# Patient Record
Sex: Male | Born: 1950
Health system: Southern US, Community
[De-identification: ages and names within clinical notes are randomized; demographics above are authoritative.]

## PROBLEM LIST (undated history)

## (undated) DIAGNOSIS — N189 Chronic kidney disease, unspecified: Secondary | ICD-10-CM

## (undated) DIAGNOSIS — Z87442 Personal history of urinary calculi: Secondary | ICD-10-CM

## (undated) DIAGNOSIS — I10 Essential (primary) hypertension: Secondary | ICD-10-CM

## (undated) DIAGNOSIS — C61 Malignant neoplasm of prostate: Secondary | ICD-10-CM

## (undated) DIAGNOSIS — I4891 Unspecified atrial fibrillation: Secondary | ICD-10-CM

## (undated) DIAGNOSIS — Z978 Presence of other specified devices: Secondary | ICD-10-CM

## (undated) DIAGNOSIS — E119 Type 2 diabetes mellitus without complications: Secondary | ICD-10-CM

## (undated) DIAGNOSIS — I509 Heart failure, unspecified: Secondary | ICD-10-CM

## (undated) HISTORY — DX: Type 2 diabetes mellitus without complications: E11.9

## (undated) HISTORY — PX: TRIGGER FINGER RELEASE: SHX641

## (undated) HISTORY — DX: Malignant neoplasm of prostate: C61

## (undated) HISTORY — DX: Unspecified atrial fibrillation: I48.91

## (undated) HISTORY — PX: OTHER SURGICAL HISTORY: SHX169

## (undated) HISTORY — PX: LITHOTRIPSY: SUR834

## (undated) HISTORY — DX: Essential (primary) hypertension: I10

## (undated) HISTORY — PX: PROSTATE BIOPSY: SHX241

## (undated) HISTORY — PX: URETEROSCOPY WITH HOLMIUM LASER LITHOTRIPSY: SHX6645

---

## 2002-12-20 ENCOUNTER — Ambulatory Visit: Admission: RE | Admit: 2002-12-20 | Discharge: 2003-03-20 | Payer: Self-pay | Admitting: Radiation Oncology

## 2003-01-07 ENCOUNTER — Encounter: Admission: RE | Admit: 2003-01-07 | Discharge: 2003-01-07 | Payer: Self-pay | Admitting: Urology

## 2003-01-07 ENCOUNTER — Encounter: Payer: Self-pay | Admitting: Urology

## 2003-03-12 ENCOUNTER — Ambulatory Visit (HOSPITAL_BASED_OUTPATIENT_CLINIC_OR_DEPARTMENT_OTHER): Admission: RE | Admit: 2003-03-12 | Discharge: 2003-03-12 | Payer: Self-pay | Admitting: Urology

## 2003-05-28 ENCOUNTER — Ambulatory Visit (HOSPITAL_BASED_OUTPATIENT_CLINIC_OR_DEPARTMENT_OTHER): Admission: RE | Admit: 2003-05-28 | Discharge: 2003-05-28 | Payer: Self-pay | Admitting: Urology

## 2003-05-28 ENCOUNTER — Ambulatory Visit (HOSPITAL_COMMUNITY): Admission: RE | Admit: 2003-05-28 | Discharge: 2003-05-28 | Payer: Self-pay | Admitting: Urology

## 2003-05-28 ENCOUNTER — Encounter: Payer: Self-pay | Admitting: Urology

## 2003-06-17 ENCOUNTER — Ambulatory Visit: Admission: RE | Admit: 2003-06-17 | Discharge: 2003-06-26 | Payer: Self-pay | Admitting: Radiation Oncology

## 2004-03-10 ENCOUNTER — Ambulatory Visit (HOSPITAL_COMMUNITY): Admission: RE | Admit: 2004-03-10 | Discharge: 2004-03-10 | Payer: Self-pay | Admitting: Family Medicine

## 2011-06-08 ENCOUNTER — Other Ambulatory Visit: Payer: Self-pay | Admitting: Medical

## 2013-03-08 ENCOUNTER — Encounter: Payer: Self-pay | Admitting: Cardiology

## 2013-03-23 ENCOUNTER — Encounter: Payer: Self-pay | Admitting: Cardiology

## 2013-03-23 ENCOUNTER — Ambulatory Visit (INDEPENDENT_AMBULATORY_CARE_PROVIDER_SITE_OTHER): Payer: BC Managed Care – PPO | Admitting: Cardiology

## 2013-03-23 VITALS — BP 153/92 | HR 88 | Ht 70.5 in | Wt 228.8 lb

## 2013-03-23 DIAGNOSIS — I4891 Unspecified atrial fibrillation: Secondary | ICD-10-CM

## 2013-03-23 DIAGNOSIS — I10 Essential (primary) hypertension: Secondary | ICD-10-CM | POA: Insufficient documentation

## 2013-03-23 DIAGNOSIS — E119 Type 2 diabetes mellitus without complications: Secondary | ICD-10-CM | POA: Insufficient documentation

## 2013-03-23 NOTE — Assessment & Plan Note (Signed)
Diagnosed in 2011 by report, persistent, documented by recent ECG. He does not seem to be overly symptomatic other than a sense of palpitations. Heart rate seems adequately controlled today. After above discussion, plan will be to initiate Eliquis after he has completed dental work off aspirin. Will review labs done recently in terms of renal function and CBC. He will also need an echocardiogram to assess cardiac structure and function. Will bring him back to the office in the next few months and then decide if we need to consider cardioversion attempt or not.

## 2013-03-23 NOTE — Assessment & Plan Note (Signed)
Blood pressure elevated today. Patient reports compliance with his medications. Keep follow up with Dr. Margo Common.

## 2013-03-23 NOTE — Assessment & Plan Note (Signed)
On oral agents for control, followed by Dr. Margo Common.

## 2013-03-23 NOTE — Progress Notes (Signed)
Clinical Summary Daniel Schaefer is a 62 y.o.male referred for cardiology consultation by Dr. Margo Common. Available records reviewed. Patient was diagnosed with atrial fibrillation back in 2011. He recalls that he was taking some classes at that time, very stressful, staying up late hours, and using energy supplements. He reports feeling a vague sense of palpitations, but was told that he had an irregular heart rate on a routine check leading to the diagnosis. It is not entirely clear whether he has had paroxysmal atrial fibrillation, or whether this has persisted.  ECG reviewed from May confirms atrial fibrillation with PVC. He has no history of ischemic heart disease or cardiomyopathy. CHADS2 score is 2 based on diabetes and hypertension.  Today we discussed treatment options for atrial fibrillation in general. This includes options for heart rate control, rhythm control including cardioversion, and anticoagulation. Discussed risks and benefits of these as well.  He needs to have dental work done soon off aspirin. After our discussion, he has agreed to initiating an anticoagulant after he had the dental work completed, and this will also give [place] the opportunity to ultimately consider a cardioversion. He would need to come off aspirin with initiation of anticoagulant therapy.  He had recent lab work in May, results to be requested. He reports no unusual bleeding problems.  No Known Allergies  Current Outpatient Prescriptions  Medication Sig Dispense Refill  . aspirin 325 MG tablet Take 325 mg by mouth daily.      . metFORMIN (GLUCOPHAGE) 1000 MG tablet Take 1,000 mg by mouth 2 (two) times daily with a meal.      . metoprolol tartrate (LOPRESSOR) 25 MG tablet Take 25 mg by mouth 2 (two) times daily.      Marland Kitchen NIFEdipine (PROCARDIA-XL/ADALAT CC) 30 MG 24 hr tablet Take 30 mg by mouth daily.      . tadalafil (CIALIS) 20 MG tablet Take 20 mg by mouth daily as needed for erectile dysfunction.       No  current facility-administered medications for this visit.    Past Medical History  Diagnosis Date  . Atrial fibrillation     Reportedly diagnosed November 2011  . Type 2 diabetes mellitus   . Essential hypertension, benign   . Prostate cancer     Radiation implants    History reviewed. No pertinent past surgical history.  Family History  Problem Relation Age of Onset  . Cancer Father 43    Laryngeal  . Lymphoma Mother 30    Social History Daniel Schaefer reports that he has never smoked. He does not have any smokeless tobacco history on file. Daniel Schaefer reports that  drinks alcohol.  Review of Systems No orthopnea or PND. Pitting edema. No claudication. No regular exertional chest pain symptoms. No bleeding problems.  Physical Examination Filed Vitals:   03/23/13 1349  BP: 153/92  Pulse: 88   Filed Weights   03/23/13 1349  Weight: 228 lb 12.8 oz (103.783 kg)   Overweight male, comfortable at rest. HEENT: Conjunctiva and lids normal, oropharynx clear. Neck: Supple, no elevated JVP or carotid bruits, no thyromegaly. Lungs: Clear to auscultation, nonlabored breathing at rest. Cardiac: Irregularly irregular, no S3 or significant systolic murmur, no pericardial rub. Abdomen: Soft, nontender, bowel sounds present, no guarding or rebound. Extremities: No pitting edema, distal pulses 2+. Skin: Warm and dry. Musculoskeletal: No kyphosis. Neuropsychiatric: Alert and oriented x3, affect grossly appropriate.   Problem List and Plan   Atrial fibrillation Diagnosed in 2011 by report, persistent,  documented by recent ECG. He does not seem to be overly symptomatic other than a sense of palpitations. Heart rate seems adequately controlled today. After above discussion, plan will be to initiate Eliquis after he has completed dental work off aspirin. Will review labs done recently in terms of renal function and CBC. He will also need an echocardiogram to assess cardiac structure  and function. Will bring him back to the office in the next few months and then decide if we need to consider cardioversion attempt or not.  Essential hypertension, benign Blood pressure elevated today. Patient reports compliance with his medications. Keep follow up with Dr. Margo Common.  Type 2 diabetes mellitus On oral agents for control, followed by Dr. Margo Common.    Jonelle Sidle, M.D., F.A.C.C.

## 2013-03-23 NOTE — Patient Instructions (Addendum)
Your physician recommends that you schedule a follow-up appointment in: 2-3 months. Your physician recommends that you continue on your current medications as directed. Please refer to the Current Medication list given to you today. Your physician has requested that you have an echocardiogram. Echocardiography is a painless test that uses sound waves to create images of your heart. It provides your doctor with information about the size and shape of your heart and how well your heart's chambers and valves are working. This procedure takes approximately one hour. There are no restrictions for this procedure. Please call our office after your dental work is completed so that we can discuss your new medication.

## 2013-04-04 ENCOUNTER — Other Ambulatory Visit (INDEPENDENT_AMBULATORY_CARE_PROVIDER_SITE_OTHER): Payer: BC Managed Care – PPO

## 2013-04-04 ENCOUNTER — Other Ambulatory Visit: Payer: Self-pay

## 2013-04-04 DIAGNOSIS — I4891 Unspecified atrial fibrillation: Secondary | ICD-10-CM

## 2013-04-06 ENCOUNTER — Telehealth: Payer: Self-pay | Admitting: *Deleted

## 2013-04-06 NOTE — Telephone Encounter (Signed)
Message copied by Eustace Moore on Fri Apr 06, 2013 10:47 AM ------      Message from: MCDOWELL, Illene Bolus      Created: Thu Apr 05, 2013  8:32 AM       Reviewed report. LVEF is low normal at 50-55%, no regional wall motion abnormalities. Mild left atrial enlargement. No major valvular abnormalities. Continue current plan for management of atrial fibrillation. ------

## 2013-04-06 NOTE — Telephone Encounter (Signed)
Patient informed. 

## 2013-05-23 ENCOUNTER — Encounter: Payer: Self-pay | Admitting: Cardiology

## 2013-05-23 ENCOUNTER — Ambulatory Visit (INDEPENDENT_AMBULATORY_CARE_PROVIDER_SITE_OTHER): Payer: BC Managed Care – PPO | Admitting: Cardiology

## 2013-05-23 VITALS — BP 133/86 | HR 85 | Ht 70.0 in | Wt 226.0 lb

## 2013-05-23 DIAGNOSIS — I10 Essential (primary) hypertension: Secondary | ICD-10-CM

## 2013-05-23 DIAGNOSIS — I4891 Unspecified atrial fibrillation: Secondary | ICD-10-CM

## 2013-05-23 NOTE — Patient Instructions (Addendum)
Your physician recommends that you schedule a follow-up appointment in: 3 months. You will receive a reminder letter in the mail in about 1-2 months reminding you to call and schedule your appointment. If you don't receive this letter, please contact our office. Your physician recommends that you continue on your current medications as directed. Please refer to the Current Medication list given to you today. Your physician recommends that you have lab work today for BMET and CBC. Please have this done at [facility]. We will contact you with the lab results let you know about starting eliquis 5 mg twice daily and stopping aspirin at that time.

## 2013-05-23 NOTE — Assessment & Plan Note (Signed)
After further discussion today, plan will be to continue with a strategy of heart rate control, initiate Eliquis 5 mg twice daily in lieu of aspirin, presuming CBC and BMET are reasonable at baseline. Will obtain lab work first prior to starting the medication. Followup anticipated 3 months.

## 2013-05-23 NOTE — Progress Notes (Signed)
   Clinical Summary Mr. Daniel Schaefer is a 62 y.o.male seen in July of this year. Detailed discussion regarding long-term management of atrial fibrillation was had at that time. In the interim he has had dental work done, now prepared to start an anticoagulant. We have discussed Eliquis and he is in agreement to proceed. Would like a baseline CBC and BMET first. Creatinine was normal last year.  An echocardiogram from July showed mild LVH with LVEF 50-55%, indeterminate diastolic function, mild left atrial enlargement, mild pulmonic regurgitation.We discussed this today.  He reports no palpitations or unusual breathlessness. CHADS2 score is 2 as outlined previously. He reports no major bleeding problems.  No Known Allergies  Current Outpatient Prescriptions  Medication Sig Dispense Refill  . aspirin 325 MG tablet Take 325 mg by mouth daily.      . metFORMIN (GLUCOPHAGE) 1000 MG tablet Take 1,000 mg by mouth 2 (two) times daily with a meal.      . metoprolol tartrate (LOPRESSOR) 25 MG tablet Take 25 mg by mouth 2 (two) times daily.      Marland Kitchen NIFEdipine (PROCARDIA-XL/ADALAT CC) 30 MG 24 hr tablet Take 30 mg by mouth daily.      . tadalafil (CIALIS) 20 MG tablet Take 20 mg by mouth daily as needed for erectile dysfunction.       No current facility-administered medications for this visit.    Past Medical History  Diagnosis Date  . Atrial fibrillation     Reportedly diagnosed November 2011  . Type 2 diabetes mellitus   . Essential hypertension, benign   . Prostate cancer     Radiation implants    Social History Mr. Feil reports that he has never smoked. He does not have any smokeless tobacco history on file. Mr. Kortz reports that  drinks alcohol.  Review of Systems As outlined above.  Physical Examination Filed Vitals:   05/23/13 1523  BP: 133/86  Pulse: 85   Filed Weights   05/23/13 1519  Weight: 226 lb (102.513 kg)    Overweight male, comfortable at rest.  HEENT:  Conjunctiva and lids normal, oropharynx clear.  Neck: Supple, no elevated JVP or carotid bruits, no thyromegaly.  Lungs: Clear to auscultation, nonlabored breathing at rest.  Cardiac: Irregularly irregular, no S3 or significant systolic murmur, no pericardial rub.  Abdomen: Soft, nontender, bowel sounds present, no guarding or rebound.  Extremities: No pitting edema, distal pulses 2+.    Problem List and Plan   Atrial fibrillation After further discussion today, plan will be to continue with a strategy of heart rate control, initiate Eliquis 5 mg twice daily in lieu of aspirin, presuming CBC and BMET are reasonable at baseline. Will obtain lab work first prior to starting the medication. Followup anticipated 3 months.  Essential hypertension, benign Keep followup with Dr. Margo Common.    Jonelle Sidle, M.D., F.A.C.C.

## 2013-05-23 NOTE — Assessment & Plan Note (Signed)
Keep followup with Dr. Tapper. 

## 2013-05-30 ENCOUNTER — Telehealth: Payer: Self-pay | Admitting: Cardiology

## 2013-05-30 MED ORDER — APIXABAN 5 MG PO TABS: 5.0000 mg | ORAL_TABLET | Freq: Two times a day (BID) | ORAL | Status: DC

## 2013-05-30 NOTE — Telephone Encounter (Signed)
Pt informed. Pt also stated that if medication was expensive he would need another medication because he would not be able to afford medication.

## 2013-05-30 NOTE — Telephone Encounter (Signed)
Message copied by Burnice Logan on Wed May 30, 2013  4:13 PM ------      Message from: MCDOWELL, Illene Bolus      Created: Mon May 28, 2013  9:03 AM       Laboratory reviewed. Creatinine 1.2, hemoglobin 14.1. Should be able to start Eliquis as previously discussed my most recent office visit note. ------

## 2013-07-16 ENCOUNTER — Ambulatory Visit (INDEPENDENT_AMBULATORY_CARE_PROVIDER_SITE_OTHER): Payer: BC Managed Care – PPO | Admitting: Cardiology

## 2013-07-16 ENCOUNTER — Encounter: Payer: Self-pay | Admitting: Cardiology

## 2013-07-16 VITALS — BP 146/96 | HR 91 | Ht 71.0 in | Wt 217.4 lb

## 2013-07-16 DIAGNOSIS — I4891 Unspecified atrial fibrillation: Secondary | ICD-10-CM

## 2013-07-16 DIAGNOSIS — I10 Essential (primary) hypertension: Secondary | ICD-10-CM

## 2013-07-16 NOTE — Progress Notes (Signed)
    Clinical Summary Mr. Bacigalupi is a 62 y.o.male last seen in September. At that time he was started on Eliquis 5 mg twice daily, aspirin was stopped. He states that he has tolerated things so far. No significant bleeding issues. Interestingly, he tells me that once he started Eliquis, he seemed to have improved sensation, sense of touch.  Recent lab work showed BUN 15, creatinine 1.2, potassium 4.1, hemoglobin 14.1, platelets 239.  An echocardiogram from July showed mild LVH with LVEF 50-55%, indeterminate diastolic function, mild left atrial enlargement, mild pulmonic regurgitation.  He will be seeing Dr. Margo Common soon for a followup visit. He tells me that he has been is limiting carbohydrates in his diet and has lost 9 pounds.   No Known Allergies  Current Outpatient Prescriptions  Medication Sig Dispense Refill  . apixaban (ELIQUIS) 5 MG TABS tablet Take 1 tablet (5 mg total) by mouth 2 (two) times daily.  60 tablet  6  . metFORMIN (GLUCOPHAGE) 1000 MG tablet Take 1,000 mg by mouth 2 (two) times daily with a meal.      . metoprolol tartrate (LOPRESSOR) 25 MG tablet Take 25 mg by mouth 2 (two) times daily.      Marland Kitchen NIFEdipine (PROCARDIA-XL/ADALAT CC) 30 MG 24 hr tablet Take 30 mg by mouth daily.      . tadalafil (CIALIS) 20 MG tablet Take 20 mg by mouth daily as needed for erectile dysfunction.       No current facility-administered medications for this visit.    Past Medical History  Diagnosis Date  . Atrial fibrillation     Reportedly diagnosed November 2011  . Type 2 diabetes mellitus   . Essential hypertension, benign   . Prostate cancer     Radiation implants    Social History Mr. Matton reports that he has never smoked. He does not have any smokeless tobacco history on file. Mr. Oyster reports that he drinks alcohol.  Review of Systems No significant palpitations, no chest pain. Otherwise as outlined above.  Physical Examination Filed Vitals:   07/16/13 1144   BP: 146/96  Pulse: 91   Filed Weights   07/16/13 1144  Weight: 217 lb 6.4 oz (98.612 kg)    Overweight male, comfortable at rest.  HEENT: Conjunctiva and lids normal, oropharynx clear.  Neck: Supple, no elevated JVP or carotid bruits, no thyromegaly.  Lungs: Clear to auscultation, nonlabored breathing at rest.  Cardiac: Irregularly irregular, no S3 or significant systolic murmur, no pericardial rub.  Abdomen: Soft, nontender, bowel sounds present, no guarding or rebound.  Extremities: No pitting edema, distal pulses 2+.    Problem List and Plan   Atrial fibrillation Diagnosed in 2011 by report, persistent. He is tolerating Eliquis which will be continued at this point. Rate control via Lopressor. Check BMET and CBC in late December.  Essential hypertension, benign Blood pressure elevated today. We did discuss this. In addition to diet and weight loss, watch sodium intake more carefully. No other change to current regimen.    Jonelle Sidle, M.D., F.A.C.C.

## 2013-07-16 NOTE — Assessment & Plan Note (Signed)
Blood pressure elevated today. We did discuss this. In addition to diet and weight loss, watch sodium intake more carefully. No other change to current regimen.

## 2013-07-16 NOTE — Patient Instructions (Signed)
Your physician recommends that you schedule a follow-up appointment in: 3 months. Your physician recommends that you continue on your current medications as directed. Please refer to the Current Medication list given to you today. Your physician recommends that you return have non-fasting lab work done in late December 2014 to check a BMET and CBC levels. Please call our office before you have this done for instructions about where the lab work should be done at.

## 2013-07-16 NOTE — Assessment & Plan Note (Signed)
Diagnosed in 2011 by report, persistent. He is tolerating Eliquis which will be continued at this point. Rate control via Lopressor. Check BMET and CBC in late December.

## 2013-07-19 ENCOUNTER — Other Ambulatory Visit: Payer: Self-pay

## 2013-08-29 ENCOUNTER — Telehealth: Payer: Self-pay | Admitting: *Deleted

## 2013-08-29 NOTE — Telephone Encounter (Signed)
Left message to remind patient to have requested lab work done.

## 2013-08-30 ENCOUNTER — Ambulatory Visit: Payer: BC Managed Care – PPO | Admitting: Cardiology

## 2013-08-31 ENCOUNTER — Other Ambulatory Visit: Payer: Self-pay | Admitting: Cardiology

## 2013-08-31 LAB — CBC
HCT: 40.1 % (ref 39.0–52.0)
MCHC: 34.9 g/dL (ref 30.0–36.0)
MCV: 75.9 fL — ABNORMAL LOW (ref 78.0–100.0)
Platelets: 235 10*3/uL (ref 150–400)
RDW: 14.4 % (ref 11.5–15.5)

## 2013-08-31 LAB — BASIC METABOLIC PANEL
BUN: 15 mg/dL (ref 6–23)
Creat: 1.06 mg/dL (ref 0.50–1.35)

## 2013-09-04 ENCOUNTER — Telehealth: Payer: Self-pay | Admitting: *Deleted

## 2013-09-04 NOTE — Telephone Encounter (Signed)
Message copied by Eustace Moore on Tue Sep 04, 2013  1:47 PM ------      Message from: MCDOWELL, Illene Bolus      Created: Mon Sep 03, 2013  5:13 PM       Reviewed.  Hemoglobin and creatinine are stable.  No change to current regimen. ------

## 2013-09-05 NOTE — Telephone Encounter (Signed)
Patient informed. 

## 2013-10-30 ENCOUNTER — Ambulatory Visit: Payer: BC Managed Care – PPO | Admitting: Cardiology

## 2013-11-20 ENCOUNTER — Ambulatory Visit (INDEPENDENT_AMBULATORY_CARE_PROVIDER_SITE_OTHER): Payer: Medicare Other | Admitting: Cardiology

## 2013-11-20 ENCOUNTER — Encounter: Payer: Self-pay | Admitting: Cardiology

## 2013-11-20 VITALS — BP 123/87 | HR 98 | Ht 70.0 in | Wt 208.8 lb

## 2013-11-20 DIAGNOSIS — I4891 Unspecified atrial fibrillation: Secondary | ICD-10-CM

## 2013-11-20 DIAGNOSIS — I10 Essential (primary) hypertension: Secondary | ICD-10-CM

## 2013-11-20 NOTE — Assessment & Plan Note (Signed)
Blood pressure is normal today. 

## 2013-11-20 NOTE — Progress Notes (Signed)
    Clinical Summary Mr. Uselman is a 63 y.o.male last seen in November 2014. He has been doing well without any progressive palpitations, no bleeding problems on Eliquis. This was approved by his insurance company, but he is still concerned about the "donut hole" later in the year, worried that he will not be able to afford his medications. He asked that we switch to something different. We talked about his options including Coumadin, and the NOACs including Savaysa, the newest agent. This one has a very attractive pricing profile in comparison to the other agents.  Followup lab work in December 2014 showed hemoglobin 14.0, platelets 235, potassium 4.9, BUN 15, creatinine 1.0.  Echocardiogram from July 2014 showed mild LVH with LVEF 50-55%, indeterminate diastolic function, mild left atrial enlargement, mild pulmonic regurgitation.   No Known Allergies  Current Outpatient Prescriptions  Medication Sig Dispense Refill  . apixaban (ELIQUIS) 5 MG TABS tablet Take 1 tablet (5 mg total) by mouth 2 (two) times daily.  60 tablet  6  . metFORMIN (GLUCOPHAGE) 1000 MG tablet Take 1,000 mg by mouth 2 (two) times daily with a meal.      . metoprolol tartrate (LOPRESSOR) 25 MG tablet Take 25 mg by mouth 2 (two) times daily.      Marland Kitchen NIFEdipine (PROCARDIA-XL/ADALAT CC) 30 MG 24 hr tablet Take 30 mg by mouth daily.      . tadalafil (CIALIS) 20 MG tablet Take 20 mg by mouth daily as needed for erectile dysfunction.       No current facility-administered medications for this visit.    Past Medical History  Diagnosis Date  . Atrial fibrillation     Reportedly diagnosed November 2011  . Type 2 diabetes mellitus   . Essential hypertension, benign   . Prostate cancer     Radiation implants    Social History Mr. Zoll reports that he has never smoked. He does not have any smokeless tobacco history on file. Mr. Patchell reports that he drinks alcohol.  Review of Systems Negative except as  outlined.  Physical Examination Filed Vitals:   11/20/13 1448  BP: 123/87  Pulse: 98   Filed Weights   11/20/13 1448  Weight: 208 lb 12.8 oz (94.711 kg)    Overweight male, comfortable at rest.  HEENT: Conjunctiva and lids normal, oropharynx clear.  Neck: Supple, no elevated JVP or carotid bruits, no thyromegaly.  Lungs: Clear to auscultation, nonlabored breathing at rest.  Cardiac: Irregularly irregular, no S3 or significant systolic murmur, no pericardial rub.  Abdomen: Soft, nontender, bowel sounds present, no guarding or rebound.  Extremities: No pitting edema, distal pulses 2+.    Problem List and Plan   Atrial fibrillation Diagnosed in 2011 by report, persistent. He is tolerating Eliquis and had lab work reviewed with stable findings. He is concerned about the cost of this medication, particularly later in the year as coverages change. He would like to make a switch to a different anticoagulant. As noted above, we discussed his options and will try Savaysa which it sounds like he will be able to get for $4 a month. This is the newest agent, I would like to have this coordinated through our anticoagulation clinic. Data look favorable in terms of stroke risk reduction and bleeding risk.   Essential hypertension, benign Blood pressure is normal today.    Satira Sark, M.D., F.A.C.C.

## 2013-11-20 NOTE — Assessment & Plan Note (Signed)
Diagnosed in 2011 by report, persistent. He is tolerating Eliquis and had lab work reviewed with stable findings. He is concerned about the cost of this medication, particularly later in the year as coverages change. He would like to make a switch to a different anticoagulant. As noted above, we discussed his options and will try Savaysa which it sounds like he will be able to get for $4 a month. This is the newest agent, I would like to have this coordinated through our anticoagulation clinic. Data look favorable in terms of stroke risk reduction and bleeding risk.

## 2013-11-20 NOTE — Patient Instructions (Signed)
Your physician recommends that you schedule a follow-up appointment in: 3 months. Your physician recommends that you continue on your current medications as directed. Please refer to the Current Medication list given to you today. You will see Daniel Schaefer this Friday, March 13,2015 @2 :00 pm to start savaysa.

## 2013-11-23 ENCOUNTER — Other Ambulatory Visit: Payer: Self-pay | Admitting: *Deleted

## 2013-11-23 ENCOUNTER — Ambulatory Visit (INDEPENDENT_AMBULATORY_CARE_PROVIDER_SITE_OTHER): Payer: Medicare Other | Admitting: *Deleted

## 2013-11-23 DIAGNOSIS — Z5181 Encounter for therapeutic drug level monitoring: Secondary | ICD-10-CM

## 2013-11-23 DIAGNOSIS — I4891 Unspecified atrial fibrillation: Secondary | ICD-10-CM

## 2013-11-23 MED ORDER — EDOXABAN TOSYLATE 60 MG PO TABS: 60.0000 mg | ORAL_TABLET | Freq: Every day | ORAL | Status: DC

## 2013-11-23 NOTE — Patient Instructions (Addendum)
Changed from Eliquis to [facility] due to cost of Elquis. Rx sent to [facility] 30 day trial and savings card 12/15  SrCr 1.06  CrCl 92.72

## 2013-12-19 ENCOUNTER — Other Ambulatory Visit: Payer: Self-pay | Admitting: *Deleted

## 2013-12-19 MED ORDER — EDOXABAN TOSYLATE 60 MG PO TABS: 60.0000 mg | ORAL_TABLET | Freq: Every day | ORAL | Status: DC

## 2013-12-28 ENCOUNTER — Ambulatory Visit (INDEPENDENT_AMBULATORY_CARE_PROVIDER_SITE_OTHER): Payer: Medicare Other | Admitting: *Deleted

## 2013-12-28 DIAGNOSIS — I4891 Unspecified atrial fibrillation: Secondary | ICD-10-CM

## 2013-12-28 DIAGNOSIS — Z5181 Encounter for therapeutic drug level monitoring: Secondary | ICD-10-CM

## 2013-12-28 LAB — POCT INR: INR: 0

## 2013-12-28 MED ORDER — WARFARIN SODIUM 5 MG PO TABS: 5.0000 mg | ORAL_TABLET | Freq: Every day | ORAL | Status: DC

## 2014-01-01 ENCOUNTER — Ambulatory Visit (INDEPENDENT_AMBULATORY_CARE_PROVIDER_SITE_OTHER): Payer: Medicare Other | Admitting: *Deleted

## 2014-01-01 DIAGNOSIS — I4891 Unspecified atrial fibrillation: Secondary | ICD-10-CM

## 2014-01-01 DIAGNOSIS — Z5181 Encounter for therapeutic drug level monitoring: Secondary | ICD-10-CM

## 2014-01-01 LAB — POCT INR: INR: 1.4

## 2014-01-08 ENCOUNTER — Ambulatory Visit (INDEPENDENT_AMBULATORY_CARE_PROVIDER_SITE_OTHER): Payer: Medicare Other | Admitting: *Deleted

## 2014-01-08 DIAGNOSIS — Z5181 Encounter for therapeutic drug level monitoring: Secondary | ICD-10-CM

## 2014-01-08 DIAGNOSIS — I4891 Unspecified atrial fibrillation: Secondary | ICD-10-CM

## 2014-01-08 LAB — POCT INR: INR: 2.1

## 2014-01-22 ENCOUNTER — Ambulatory Visit (INDEPENDENT_AMBULATORY_CARE_PROVIDER_SITE_OTHER): Payer: Medicare Other | Admitting: *Deleted

## 2014-01-22 DIAGNOSIS — I4891 Unspecified atrial fibrillation: Secondary | ICD-10-CM

## 2014-01-22 DIAGNOSIS — Z5181 Encounter for therapeutic drug level monitoring: Secondary | ICD-10-CM

## 2014-01-22 LAB — POCT INR: INR: 1.4

## 2014-02-01 ENCOUNTER — Ambulatory Visit (INDEPENDENT_AMBULATORY_CARE_PROVIDER_SITE_OTHER): Payer: Medicare Other | Admitting: *Deleted

## 2014-02-01 DIAGNOSIS — I4891 Unspecified atrial fibrillation: Secondary | ICD-10-CM

## 2014-02-01 DIAGNOSIS — Z5181 Encounter for therapeutic drug level monitoring: Secondary | ICD-10-CM

## 2014-02-01 LAB — POCT INR: INR: 2.3

## 2014-02-19 ENCOUNTER — Ambulatory Visit (INDEPENDENT_AMBULATORY_CARE_PROVIDER_SITE_OTHER): Payer: Medicare Other | Admitting: *Deleted

## 2014-02-19 DIAGNOSIS — I4891 Unspecified atrial fibrillation: Secondary | ICD-10-CM

## 2014-02-19 DIAGNOSIS — Z5181 Encounter for therapeutic drug level monitoring: Secondary | ICD-10-CM

## 2014-02-19 LAB — POCT INR: INR: 2.7

## 2014-02-26 ENCOUNTER — Encounter: Payer: Self-pay | Admitting: Cardiology

## 2014-02-26 ENCOUNTER — Ambulatory Visit (INDEPENDENT_AMBULATORY_CARE_PROVIDER_SITE_OTHER): Payer: Medicare Other | Admitting: Cardiology

## 2014-02-26 VITALS — BP 126/76 | HR 59 | Ht 70.0 in | Wt 211.1 lb

## 2014-02-26 DIAGNOSIS — I4891 Unspecified atrial fibrillation: Secondary | ICD-10-CM

## 2014-02-26 DIAGNOSIS — I10 Essential (primary) hypertension: Secondary | ICD-10-CM

## 2014-02-26 NOTE — Assessment & Plan Note (Signed)
Diagnosed November 2011, but presumably paroxysmal with ECG showing sinus rhythm today. Continue current regimen including Coumadin.

## 2014-02-26 NOTE — Progress Notes (Signed)
    Clinical Summary Mr. Celaya is a 63 y.o.male last seen in March of this year. He is on Coumadin for stroke prophylaxis at this point, followed in our anticoagulation clinic. He has had no bleeding problems. He states that he has felt more regularity in his heart rate in the last few months, actually his ECG today shows sinus rhythm. We discussed this.  Echocardiogram from July 2014 showed mild LVH with LVEF 50-55%, indeterminate diastolic function, mild left atrial enlargement, mild pulmonic regurgitation.   No Known Allergies  Current Outpatient Prescriptions  Medication Sig Dispense Refill  . fexofenadine (ALLEGRA) 180 MG tablet Take 180 mg by mouth daily.      . metFORMIN (GLUCOPHAGE) 1000 MG tablet Take 1,000 mg by mouth 2 (two) times daily with a meal.      . metoprolol tartrate (LOPRESSOR) 25 MG tablet Take 25 mg by mouth 2 (two) times daily.      Marland Kitchen NIFEdipine (PROCARDIA-XL/ADALAT CC) 30 MG 24 hr tablet Take 30 mg by mouth daily.      Marland Kitchen omeprazole (PRILOSEC) 20 MG capsule Take 20 mg by mouth daily.      . tadalafil (CIALIS) 20 MG tablet Take 20 mg by mouth daily as needed for erectile dysfunction.      . traZODone (DESYREL) 50 MG tablet Take 50 mg by mouth at bedtime.      Marland Kitchen warfarin (COUMADIN) 5 MG tablet Take 5 mg by mouth daily. MANAGED BY LISA       No current facility-administered medications for this visit.    Past Medical History  Diagnosis Date  . Atrial fibrillation     Reportedly diagnosed November 2011  . Type 2 diabetes mellitus   . Essential hypertension, benign   . Prostate cancer     Radiation implants    Social History Mr. Longoria reports that he has never smoked. He does not have any smokeless tobacco history on file. Mr. Redner reports that he drinks alcohol.  Review of Systems Having trouble with fluid in his right ear. Also had a bad allergy season. Had EGD with esophageal stricture dilatation. Other systems reviewed and  negative.  Physical Examination Filed Vitals:   02/26/14 1504  BP: 126/76  Pulse: 59   Filed Weights   02/26/14 1504  Weight: 211 lb 1.9 oz (95.763 kg)    Overweight male, comfortable at rest.  HEENT: Conjunctiva and lids normal, oropharynx clear.  Neck: Supple, no elevated JVP or carotid bruits, no thyromegaly.  Lungs: Clear to auscultation, nonlabored breathing at rest.  Cardiac: Irregularly irregular, no S3 or significant systolic murmur, no pericardial rub.  Abdomen: Soft, nontender, bowel sounds present, no guarding or rebound.  Extremities: No pitting edema, distal pulses 2+.    Problem List and Plan   Atrial fibrillation Diagnosed November 2011, but presumably paroxysmal with ECG showing sinus rhythm today. Continue current regimen including Coumadin.  Essential hypertension, benign Good blood pressure control today, no changes made. Keep follow with Dr. Scotty Court.    Satira Sark, M.D., F.A.C.C.

## 2014-02-26 NOTE — Assessment & Plan Note (Signed)
Good blood pressure control today, no changes made. Keep follow with Dr. Scotty Court.

## 2014-02-26 NOTE — Patient Instructions (Signed)

## 2014-03-12 ENCOUNTER — Ambulatory Visit (INDEPENDENT_AMBULATORY_CARE_PROVIDER_SITE_OTHER): Payer: Medicare Other | Admitting: *Deleted

## 2014-03-12 DIAGNOSIS — Z5181 Encounter for therapeutic drug level monitoring: Secondary | ICD-10-CM

## 2014-03-12 DIAGNOSIS — I4891 Unspecified atrial fibrillation: Secondary | ICD-10-CM

## 2014-03-12 LAB — POCT INR: INR: 2.5

## 2014-04-12 ENCOUNTER — Ambulatory Visit (INDEPENDENT_AMBULATORY_CARE_PROVIDER_SITE_OTHER): Payer: Medicare Other | Admitting: *Deleted

## 2014-04-12 DIAGNOSIS — I4891 Unspecified atrial fibrillation: Secondary | ICD-10-CM

## 2014-04-12 DIAGNOSIS — Z5181 Encounter for therapeutic drug level monitoring: Secondary | ICD-10-CM

## 2014-04-12 LAB — POCT INR: INR: 2.4

## 2014-05-13 ENCOUNTER — Encounter: Payer: Self-pay | Admitting: Cardiology

## 2014-05-13 ENCOUNTER — Ambulatory Visit (INDEPENDENT_AMBULATORY_CARE_PROVIDER_SITE_OTHER): Payer: Medicare Other | Admitting: Cardiology

## 2014-05-13 VITALS — BP 147/83 | HR 57 | Ht 70.0 in | Wt 210.0 lb

## 2014-05-13 DIAGNOSIS — I5031 Acute diastolic (congestive) heart failure: Secondary | ICD-10-CM | POA: Insufficient documentation

## 2014-05-13 DIAGNOSIS — I10 Essential (primary) hypertension: Secondary | ICD-10-CM

## 2014-05-13 DIAGNOSIS — I480 Paroxysmal atrial fibrillation: Secondary | ICD-10-CM

## 2014-05-13 DIAGNOSIS — I5032 Chronic diastolic (congestive) heart failure: Secondary | ICD-10-CM | POA: Insufficient documentation

## 2014-05-13 DIAGNOSIS — I4891 Unspecified atrial fibrillation: Secondary | ICD-10-CM

## 2014-05-13 MED ORDER — FUROSEMIDE 20 MG PO TABS: 40.0000 mg | ORAL_TABLET | Freq: Every day | ORAL | Status: DC

## 2014-05-13 NOTE — Progress Notes (Signed)
Clinical Summary Daniel Schaefer is a 63 y.o.male last seen in June. Review finds recent hospitalization at [facility] with cough and shortness of breath, chest heaviness. He ruled out for myocardial infarction, was noted to be in sinus rhythm at that time as well. Followup studies showed normal LV function with no major abnormalities, and no ischemia by Cardiolite.  Echocardiogram from August 17 reported mild to moderate LVH with LVEF 60-65%, abnormal diastolic function, no major valvular abnormalities, RVSP 28 mm mercury, no pericardial effusion. Lexa scan Cardiolite on August 18 reported a mild basal inferior fixed defect, no active ischemia, LVEF 59%. Chest x-ray did report mild vascular congestion, and his blood pressure was high at presentation.  Recent lab work shows BUN 18, creatinine 1.1, d-dimer normal. 24-hour urine collection for metanephrines and normetanephrines showed normal levels.  He tells that he has been trying to lose weight, no sodas but has been drinking 2 quarts of water each day. Despite the addition of Lasix, his urine output has not picked up, and he says that his weight has gone up a few pounds as well. Breathing is however better. He reports some other changes in his medication since saw him, including Lipitor and a change in his ACE inhibitor. Nothing to clearly explain fluid gain however.  No Known Allergies  Current Outpatient Prescriptions  Medication Sig Dispense Refill  . acetaminophen (TYLENOL) 500 MG tablet Take 500 mg by mouth every 6 (six) hours as needed.      Marland Kitchen amLODipine (NORVASC) 10 MG tablet Take 10 mg by mouth daily.      Marland Kitchen atorvastatin (LIPITOR) 20 MG tablet Take 20 mg by mouth daily.      . furosemide (LASIX) 20 MG tablet Take 2 tablets (40 mg total) by mouth daily.  60 tablet  3  . lisinopril (PRINIVIL,ZESTRIL) 20 MG tablet Take 20 mg by mouth daily.      Marland Kitchen loratadine (CLARITIN) 10 MG tablet Take 10 mg by mouth daily.      . Magnesium 250 MG TABS  Take 250 mg by mouth daily.      . metFORMIN (GLUCOPHAGE) 1000 MG tablet Take 1,000 mg by mouth 2 (two) times daily with a meal.      . metoprolol (LOPRESSOR) 50 MG tablet Take 50 mg by mouth 2 (two) times daily.      Marland Kitchen omeprazole (PRILOSEC) 20 MG capsule Take 20 mg by mouth daily.      . potassium chloride (KLOR-CON) 20 MEQ packet Take 20 mEq by mouth once.      . tadalafil (CIALIS) 20 MG tablet Take 20 mg by mouth daily as needed for erectile dysfunction.      . traZODone (DESYREL) 50 MG tablet Take 50 mg by mouth at bedtime.      Marland Kitchen warfarin (COUMADIN) 5 MG tablet Take 5 mg by mouth daily. MANAGED BY LISA       No current facility-administered medications for this visit.    Past Medical History  Diagnosis Date  . Atrial fibrillation     Reportedly diagnosed November 2011  . Type 2 diabetes mellitus   . Essential hypertension, benign   . Prostate cancer     Radiation implants    Social History Daniel Schaefer reports that he has never smoked. He does not have any smokeless tobacco history on file. Daniel Schaefer reports that he drinks alcohol.  Review of Systems No palpitations, no exertional chest pain. No unusual level of stress. Other  systems reviewed and negative.  Physical Examination Filed Vitals:   05/13/14 1014  BP: 147/83  Pulse: 57   Filed Weights   05/13/14 1014  Weight: 210 lb (95.255 kg)    Overweight male, comfortable at rest.  HEENT: Conjunctiva and lids normal, oropharynx clear.  Neck: Supple, no elevated JVP or carotid bruits, no thyromegaly.  Lungs: Clear to auscultation, nonlabored breathing at rest.  Cardiac: Irregularly irregular, no S3 or significant systolic murmur, no pericardial rub.  Abdomen: Soft, nontender, bowel sounds present, no guarding or rebound.  Extremities: No pitting edema, distal pulses 2+.  Skin: Warm and dry. Musculoskeletal: No kyphosis. Neuropsychiatric: Alert and oriented x3, affect appropriate.   Problem List and Plan    Atrial fibrillation The patient has been maintaining sinus rhythm, plan to continue beta blocker and Coumadin. Not entirely clear that PAF was responsible for his recent hospitalization with volume overload.  Acute diastolic heart failure Possible diagnosis, although recent cardiac structural and ischemic workup was overall reassuring. Medications look reasonable, will increase Lasix to 40 mg daily for now. Keep followup with Dr. Delfin Gant next week.  Essential hypertension, benign Significantly higher blood pressures at hospital presentation. 24-hour urine collection argues against pheochromocytoma.     Satira Sark, M.D., F.A.C.C.

## 2014-05-13 NOTE — Assessment & Plan Note (Signed)
The patient has been maintaining sinus rhythm, plan to continue beta blocker and Coumadin. Not entirely clear that PAF was responsible for his recent hospitalization with volume overload.

## 2014-05-13 NOTE — Assessment & Plan Note (Signed)
Possible diagnosis, although recent cardiac structural and ischemic workup was overall reassuring. Medications look reasonable, will increase Lasix to 40 mg daily for now. Keep followup with Dr. Delfin Gant next week.

## 2014-05-13 NOTE — Patient Instructions (Signed)
Your physician recommends that you schedule a follow-up appointment in: 3 months. Your physician has recommended you make the following change in your medication:  Increase your furosemide to 40 mg daily. Please take (2) of your 20 mg tablets daily. Continue all other medications the same. Your physician recommends that you have lab work in 3 months just before your next visit to check your BMET.

## 2014-05-13 NOTE — Assessment & Plan Note (Signed)
Significantly higher blood pressures at hospital presentation. 24-hour urine collection argues against pheochromocytoma.

## 2014-05-14 ENCOUNTER — Encounter: Payer: Medicare Other | Admitting: Cardiology

## 2014-05-24 ENCOUNTER — Ambulatory Visit (INDEPENDENT_AMBULATORY_CARE_PROVIDER_SITE_OTHER): Payer: Medicare Other | Admitting: *Deleted

## 2014-05-24 DIAGNOSIS — Z5181 Encounter for therapeutic drug level monitoring: Secondary | ICD-10-CM

## 2014-05-24 DIAGNOSIS — I4891 Unspecified atrial fibrillation: Secondary | ICD-10-CM

## 2014-05-24 LAB — POCT INR: INR: 2.7

## 2014-06-13 ENCOUNTER — Other Ambulatory Visit: Payer: Self-pay | Admitting: *Deleted

## 2014-06-13 MED ORDER — WARFARIN SODIUM 5 MG PO TABS: 5.0000 mg | ORAL_TABLET | Freq: Every day | ORAL | Status: DC

## 2014-07-04 ENCOUNTER — Telehealth: Payer: Self-pay | Admitting: Cardiology

## 2014-07-04 ENCOUNTER — Ambulatory Visit (INDEPENDENT_AMBULATORY_CARE_PROVIDER_SITE_OTHER): Payer: Medicare Other | Admitting: *Deleted

## 2014-07-04 DIAGNOSIS — I4891 Unspecified atrial fibrillation: Secondary | ICD-10-CM

## 2014-07-04 DIAGNOSIS — Z5181 Encounter for therapeutic drug level monitoring: Secondary | ICD-10-CM

## 2014-07-04 LAB — POCT INR: INR: 2.1

## 2014-07-04 NOTE — Telephone Encounter (Signed)
furosemide (LASIX) 20 MG tablet HAS question about doseage of this medication

## 2014-07-04 NOTE — Telephone Encounter (Signed)
Patient didn't remember having his furosemide dose increased to 40 mg daily. Patient was informed that his dose was increased at his last office visit. Patient informed nurse that he has been taking one tablet daily.

## 2014-08-15 ENCOUNTER — Ambulatory Visit (INDEPENDENT_AMBULATORY_CARE_PROVIDER_SITE_OTHER): Payer: Medicare Other | Admitting: *Deleted

## 2014-08-15 DIAGNOSIS — Z5181 Encounter for therapeutic drug level monitoring: Secondary | ICD-10-CM

## 2014-08-15 DIAGNOSIS — I4891 Unspecified atrial fibrillation: Secondary | ICD-10-CM

## 2014-08-15 LAB — POCT INR: INR: 1.9

## 2014-09-02 ENCOUNTER — Ambulatory Visit (INDEPENDENT_AMBULATORY_CARE_PROVIDER_SITE_OTHER): Payer: Medicare Other | Admitting: *Deleted

## 2014-09-02 ENCOUNTER — Ambulatory Visit (INDEPENDENT_AMBULATORY_CARE_PROVIDER_SITE_OTHER): Payer: Medicare Other | Admitting: Cardiology

## 2014-09-02 ENCOUNTER — Encounter: Payer: Self-pay | Admitting: Cardiology

## 2014-09-02 VITALS — BP 160/77 | HR 53 | Ht 70.0 in | Wt 205.0 lb

## 2014-09-02 DIAGNOSIS — I4891 Unspecified atrial fibrillation: Secondary | ICD-10-CM

## 2014-09-02 DIAGNOSIS — I10 Essential (primary) hypertension: Secondary | ICD-10-CM

## 2014-09-02 DIAGNOSIS — Z5181 Encounter for therapeutic drug level monitoring: Secondary | ICD-10-CM

## 2014-09-02 DIAGNOSIS — I480 Paroxysmal atrial fibrillation: Secondary | ICD-10-CM

## 2014-09-02 LAB — POCT INR: INR: 2.3

## 2014-09-02 NOTE — Progress Notes (Signed)
Reason for visit: Atrial fibrillation  Clinical Summary Daniel Schaefer is a 63 y.o.male last seen in August. Lasix was increased at the last visit. He reports no change in stamina, no palpitations or chest pain. NYHA class II dyspnea. His weight is down 5 pounds. He continues on Coumadin, no active bleeding problems.  Recent lab work shows BUN 15, creatinine 1.0, potassium 3.7.  Echocardiogram from August 2015 reported mild to moderate LVH with LVEF 60-65%, abnormal diastolic function, no major valvular abnormalities, RVSP 28 mm mercury, no pericardial effusion. Lexiscan Cardiolite on August 2015 reported a mild basal inferior fixed defect, no active ischemia, LVEF 59%.  He had a recent visit with Dr. Scotty Court, is not taking Lipitor at this time. States that he was feeling fatigued with the medication.  He is also seeing an endocrinologist at this time, trying to get his blood glucose under better control.  No Known Allergies  Current Outpatient Prescriptions  Medication Sig Dispense Refill  . acetaminophen (TYLENOL) 500 MG tablet Take 500 mg by mouth every 6 (six) hours as needed.    Marland Kitchen amLODipine (NORVASC) 10 MG tablet Take 10 mg by mouth daily.    . furosemide (LASIX) 20 MG tablet Take 20 mg by mouth daily.    Marland Kitchen lisinopril (PRINIVIL,ZESTRIL) 20 MG tablet Take 20 mg by mouth daily.    Marland Kitchen loratadine (CLARITIN) 10 MG tablet Take 10 mg by mouth daily.    . Magnesium 250 MG TABS Take 250 mg by mouth daily.    . metFORMIN (GLUCOPHAGE) 1000 MG tablet Take 1,000 mg by mouth 2 (two) times daily with a meal.    . metoprolol (LOPRESSOR) 50 MG tablet Take 25 mg by mouth 2 (two) times daily.     Marland Kitchen omeprazole (PRILOSEC) 20 MG capsule Take 20 mg by mouth daily.    . potassium chloride (KLOR-CON) 20 MEQ packet Take 20 mEq by mouth once.    . tadalafil (CIALIS) 20 MG tablet Take 20 mg by mouth daily as needed for erectile dysfunction.    . traZODone (DESYREL) 50 MG tablet Take 50 mg by mouth at bedtime.     Marland Kitchen warfarin (COUMADIN) 5 MG tablet Take 1 tablet (5 mg total) by mouth daily. MANAGED BY LISA 45 tablet 3   No current facility-administered medications for this visit.    Past Medical History  Diagnosis Date  . Atrial fibrillation     Reportedly diagnosed November 2011  . Type 2 diabetes mellitus   . Essential hypertension, benign   . Prostate cancer     Radiation implants    Social History Daniel Schaefer reports that he has never smoked. He has never used smokeless tobacco. Daniel Schaefer reports that he drinks alcohol.  Review of Systems Complete review of systems negative except as otherwise outlined in the clinical summary and also the following. No palpitations or regular chest pain symptoms.  Physical Examination Filed Vitals:   09/02/14 0907  BP: 160/77  Pulse: 53   Filed Weights   09/02/14 0903 09/02/14 0907  Weight: 205 lb (92.987 kg) 205 lb (92.987 kg)    Overweight male, comfortable at rest.  HEENT: Conjunctiva and lids normal, oropharynx clear.  Neck: Supple, no elevated JVP or carotid bruits, no thyromegaly.  Lungs: Clear to auscultation, nonlabored breathing at rest.  Cardiac: RRR, no S3 or significant systolic murmur, no pericardial rub.  Abdomen: Soft, nontender, bowel sounds present, no guarding or rebound.  Extremities: No pitting edema, distal pulses 2+.  Skin: Warm and dry. Musculoskeletal: No kyphosis. Neuropsychiatric: Alert and oriented x3, affect appropriate.   Problem List and Plan   Atrial fibrillation He is in sinus rhythm today on examination, plan to continue current dose of Lopressor and Coumadin. Follow-up in 6 months.  Essential hypertension, benign Blood pressure is elevated today. He is trying to lose some weight, and plans to start a regular exercise regimen soon.    Satira Sark, M.D., F.A.C.C.

## 2014-09-02 NOTE — Assessment & Plan Note (Signed)
Blood pressure is elevated today. He is trying to lose some weight, and plans to start a regular exercise regimen soon.

## 2014-09-02 NOTE — Patient Instructions (Signed)

## 2014-09-02 NOTE — Assessment & Plan Note (Signed)
He is in sinus rhythm today on examination, plan to continue current dose of Lopressor and Coumadin. Follow-up in 6 months.

## 2014-10-01 ENCOUNTER — Ambulatory Visit (INDEPENDENT_AMBULATORY_CARE_PROVIDER_SITE_OTHER): Payer: Medicare Other | Admitting: *Deleted

## 2014-10-01 DIAGNOSIS — I4891 Unspecified atrial fibrillation: Secondary | ICD-10-CM

## 2014-10-01 DIAGNOSIS — Z5181 Encounter for therapeutic drug level monitoring: Secondary | ICD-10-CM

## 2014-10-01 LAB — POCT INR: INR: 2.9

## 2014-10-29 ENCOUNTER — Ambulatory Visit (INDEPENDENT_AMBULATORY_CARE_PROVIDER_SITE_OTHER): Payer: Medicare Other | Admitting: *Deleted

## 2014-10-29 DIAGNOSIS — Z5181 Encounter for therapeutic drug level monitoring: Secondary | ICD-10-CM

## 2014-10-29 DIAGNOSIS — I4891 Unspecified atrial fibrillation: Secondary | ICD-10-CM

## 2014-10-29 LAB — POCT INR: INR: 2.6

## 2014-11-13 ENCOUNTER — Telehealth: Payer: Self-pay | Admitting: *Deleted

## 2014-11-13 MED ORDER — WARFARIN SODIUM 5 MG PO TABS: 5.0000 mg | ORAL_TABLET | Freq: Every day | ORAL | Status: DC

## 2014-11-13 NOTE — Telephone Encounter (Signed)
Warfarin sent to [facility], pt says pharmacy has sent request, we have no record of this. Refilled medication while talking with pt.

## 2014-12-10 ENCOUNTER — Ambulatory Visit (INDEPENDENT_AMBULATORY_CARE_PROVIDER_SITE_OTHER): Payer: Medicare Other | Admitting: *Deleted

## 2014-12-10 DIAGNOSIS — I4891 Unspecified atrial fibrillation: Secondary | ICD-10-CM | POA: Diagnosis not present

## 2014-12-10 DIAGNOSIS — Z5181 Encounter for therapeutic drug level monitoring: Secondary | ICD-10-CM | POA: Diagnosis not present

## 2014-12-10 LAB — POCT INR: INR: 2.1

## 2014-12-17 ENCOUNTER — Ambulatory Visit (INDEPENDENT_AMBULATORY_CARE_PROVIDER_SITE_OTHER): Payer: Medicare Other | Admitting: Cardiology

## 2014-12-17 ENCOUNTER — Encounter: Payer: Self-pay | Admitting: Cardiology

## 2014-12-17 VITALS — BP 128/62 | HR 51 | Ht 70.0 in | Wt 199.0 lb

## 2014-12-17 DIAGNOSIS — I480 Paroxysmal atrial fibrillation: Secondary | ICD-10-CM | POA: Diagnosis not present

## 2014-12-17 DIAGNOSIS — I5031 Acute diastolic (congestive) heart failure: Secondary | ICD-10-CM

## 2014-12-17 DIAGNOSIS — R002 Palpitations: Secondary | ICD-10-CM

## 2014-12-17 MED ORDER — METOPROLOL TARTRATE 25 MG PO TABS
25.0000 mg | ORAL_TABLET | Freq: Two times a day (BID) | ORAL | Status: DC
Start: 2014-12-17 — End: 2015-07-13

## 2014-12-17 NOTE — Patient Instructions (Signed)
   Please keep your already scheduled visit for February 24, 2015 with Dr. Domenic Polite. Your physician recommends that you continue on your current medications as directed. Please refer to the Current Medication list given to you today. Refill sent today for metoprolol tartrate. Please contact our office if your symptoms of palpitations continue so that we can order a 48 hour holter monitor as Dr. Domenic Polite suggested on today's visit.

## 2014-12-17 NOTE — Progress Notes (Signed)
Cardiology Office Note  Date: 12/17/2014   ID: Daniel Schaefer, DOB 14-Nov-1950, MRN [id number]  PCP: Deloria Lair, MD  Primary Cardiologist: Rozann Lesches, MD   Chief Complaint  Patient presents with  . Atrial Fibrillation    History of Present Illness: Daniel Schaefer is a 64 y.o. male last seen in December 2015. He presents for a follow-up visit, states that he has felt more of a sense of intermittent palpitations over the last few weeks, thinks that he may be going in and out of atrial fibrillation. He does not identify any clear precipitant. He does state that he is been trying to walk more regularly and lose some weight. He does not endorse any exertional chest pain, has had no dizziness or syncope. He also reports compliance with his medications.  He felt that he was out of atrial fibrillation today, however his ECG confirms sinus bradycardia. He thinks that maybe his "nerves" have something to do with it. We did talk about obtaining a Holter monitor to see if in fact he is experiencing intermittent PAF, also discussed the possibility of antiarrhythmic treatments. For now he wanted to observe further prior to making any other changes.   Past Medical History  Diagnosis Date  . Atrial fibrillation     Reportedly diagnosed November 2011  . Type 2 diabetes mellitus   . Essential hypertension, benign   . Prostate cancer     Radiation implants    Current Outpatient Prescriptions  Medication Sig Dispense Refill  . acetaminophen (TYLENOL) 500 MG tablet Take 500 mg by mouth every 6 (six) hours as needed.    Marland Kitchen amLODipine (NORVASC) 10 MG tablet Take 10 mg by mouth daily.    Marland Kitchen atorvastatin (LIPITOR) 20 MG tablet Take 10 mg by mouth daily.    . furosemide (LASIX) 20 MG tablet Take 20 mg by mouth daily.    Marland Kitchen lisinopril (PRINIVIL,ZESTRIL) 20 MG tablet Take 20 mg by mouth daily.    Marland Kitchen loratadine (CLARITIN) 10 MG tablet Take 10 mg by mouth daily.    . metFORMIN (GLUCOPHAGE) 1000 MG  tablet Take 1,000 mg by mouth 2 (two) times daily with a meal.    . metoprolol tartrate (LOPRESSOR) 25 MG tablet Take 1 tablet (25 mg total) by mouth 2 (two) times daily. 60 tablet 6  . omeprazole (PRILOSEC) 20 MG capsule Take 20 mg by mouth daily.    . potassium chloride (KLOR-CON) 20 MEQ packet Take 20 mEq by mouth once.    . tadalafil (CIALIS) 20 MG tablet Take 20 mg by mouth daily as needed for erectile dysfunction.    . traZODone (DESYREL) 50 MG tablet Take 50 mg by mouth at bedtime.    Marland Kitchen warfarin (COUMADIN) 5 MG tablet Take 1 tablet (5 mg total) by mouth daily. MANAGED BY LISA 45 tablet 3   No current facility-administered medications for this visit.    Allergies:  Review of patient's allergies indicates no known allergies.   Social History: The patient  reports that he has never smoked. He has never used smokeless tobacco. He reports that he drinks alcohol. He reports that he does not use illicit drugs.   ROS:  Please see the history of present illness. Otherwise, complete review of systems is positive for none.  All other systems are reviewed and negative.   Physical Exam: VS:  BP 128/62 mmHg  Pulse 51  Ht 5\' 10"  (1.778 m)  Wt 199 lb (90.266 kg)  BMI  28.55 kg/m2  SpO2 98%, BMI Body mass index is 28.55 kg/(m^2).  Wt Readings from Last 3 Encounters:  12/17/14 199 lb (90.266 kg)  09/02/14 205 lb (92.987 kg)  05/13/14 210 lb (95.255 kg)     Overweight male, comfortable at rest.  HEENT: Conjunctiva and lids normal, oropharynx clear.  Neck: Supple, no elevated JVP or carotid bruits, no thyromegaly.  Lungs: Clear to auscultation, nonlabored breathing at rest.  Cardiac: RRR, no S3 or significant systolic murmur, no pericardial rub.  Abdomen: Soft, nontender, bowel sounds present, no guarding or rebound.  Extremities: No pitting edema, distal pulses 2+.    ECG: ECG is ordered today and reviewed showing sinus bradycardia with borderline voltage.   Recent  Labwork:  08/28/2014 BUN 15, creatinine 1.0, potassium 3.7  Other Studies Reviewed Today:  Echocardiogram from August 2015 reported mild to moderate LVH with LVEF 60-65%, abnormal diastolic function, no major valvular abnormalities, RVSP 28 mm mercury, no pericardial effusion.   Lexiscan Cardiolite on August 2015 reported a mild basal inferior fixed defect, no active ischemia, LVEF 59%.  Assessment and Plan:  1. Since of intermittent palpitations over the last few weeks, not necessarily rapid. ECG today confirms sinus bradycardia. He reports compliance with Lopressor and Coumadin. We did discuss the possibility of further outpatient monitoring to better determine if he is having PAF or not, and for now he preferred observation, will call us back if symptoms worsen.  2. History of paroxysmal atrial fibrillation, generally has been well managed in terms of heart rate and symptoms. No changes made to current regimen.  Current medicines were reviewed with the patient today. Refill provided for Lopressor.   Orders Placed This Encounter  Procedures  . EKG 12-Lead    Disposition: FU with me in June as scheduled.   Signed, Satira Sark, MD, [place] 12/17/2014 12:02 PM    [facility], [place], [place] [address] Phone: [phone]; Fax: [phone]

## 2015-01-21 ENCOUNTER — Ambulatory Visit (INDEPENDENT_AMBULATORY_CARE_PROVIDER_SITE_OTHER): Payer: Medicare Other | Admitting: *Deleted

## 2015-01-21 DIAGNOSIS — Z5181 Encounter for therapeutic drug level monitoring: Secondary | ICD-10-CM | POA: Diagnosis not present

## 2015-01-21 DIAGNOSIS — I4891 Unspecified atrial fibrillation: Secondary | ICD-10-CM | POA: Diagnosis not present

## 2015-01-21 LAB — POCT INR: INR: 2.2

## 2015-02-24 ENCOUNTER — Encounter: Payer: Self-pay | Admitting: Cardiology

## 2015-02-24 ENCOUNTER — Ambulatory Visit (INDEPENDENT_AMBULATORY_CARE_PROVIDER_SITE_OTHER): Payer: Medicare Other | Admitting: Cardiology

## 2015-02-24 VITALS — BP 104/67 | HR 64 | Ht 70.0 in | Wt 195.8 lb

## 2015-02-24 DIAGNOSIS — I480 Paroxysmal atrial fibrillation: Secondary | ICD-10-CM

## 2015-02-24 DIAGNOSIS — I10 Essential (primary) hypertension: Secondary | ICD-10-CM

## 2015-02-24 MED ORDER — POTASSIUM CHLORIDE ER 10 MEQ PO TBCR: 10.0000 meq | EXTENDED_RELEASE_TABLET | Freq: Every day | ORAL | Status: DC

## 2015-02-24 NOTE — Progress Notes (Signed)
Cardiology Office Note  Date: 02/24/2015   ID: Daniel Schaefer, DOB [birth year], MRN [id number]  PCP: Deloria Lair, MD  Primary Cardiologist: Rozann Lesches, MD   Chief Complaint  Patient presents with  . Atrial Fibrillation    History of Present Illness: Daniel Schaefer is a 64 y.o. male last seen in April. He presents for a routine follow-up visit. Since last visit, he has not had any substantial palpitations. He continues on beta blocker and Coumadin, with follow-up in the Coumadin clinic.  He does report some trouble with elevated blood glucose, followed by Dr. Dorris Fetch. He continues on Glucophage. Has been trying to restrict his diet. His weight is down nearly 30 pounds over the last 2 years.   Past Medical History  Diagnosis Date  . Atrial fibrillation     Reportedly diagnosed November 2011  . Type 2 diabetes mellitus   . Essential hypertension, benign   . Prostate cancer     Radiation implants    Current Outpatient Prescriptions  Medication Sig Dispense Refill  . acetaminophen (TYLENOL) 500 MG tablet Take 500 mg by mouth every 6 (six) hours as needed.    Marland Kitchen amLODipine (NORVASC) 10 MG tablet Take 10 mg by mouth daily.    . furosemide (LASIX) 20 MG tablet Take 20 mg by mouth daily.    Marland Kitchen lisinopril (PRINIVIL,ZESTRIL) 20 MG tablet Take 20 mg by mouth daily.    Marland Kitchen loratadine (CLARITIN) 10 MG tablet Take 10 mg by mouth daily.    . metFORMIN (GLUCOPHAGE) 1000 MG tablet Take 1,000 mg by mouth 2 (two) times daily with a meal.    . metoprolol tartrate (LOPRESSOR) 25 MG tablet Take 1 tablet (25 mg total) by mouth 2 (two) times daily. 60 tablet 6  . omeprazole (PRILOSEC) 20 MG capsule Take 20 mg by mouth daily.    . potassium chloride (KLOR-CON) 20 MEQ packet Take 20 mEq by mouth once.    . tadalafil (CIALIS) 20 MG tablet Take 20 mg by mouth daily as needed for erectile dysfunction.    . traZODone (DESYREL) 50 MG tablet Take 50 mg by mouth at bedtime.    Marland Kitchen warfarin (COUMADIN)  5 MG tablet Take 1 tablet (5 mg total) by mouth daily. MANAGED BY LISA 45 tablet 3  . atorvastatin (LIPITOR) 20 MG tablet Take 10 mg by mouth daily.     No current facility-administered medications for this visit.    Allergies:  Review of patient's allergies indicates no known allergies.   Social History: The patient  reports that he has never smoked. He has never used smokeless tobacco. He reports that he drinks alcohol. He reports that he does not use illicit drugs.   ROS:  Please see the history of present illness. Otherwise, complete review of systems is positive for recent "stomach bug."  All other systems are reviewed and negative.   Physical Exam: VS:  BP 104/67 mmHg  Pulse 64  Ht 5\' 10"  (1.778 m)  Wt 195 lb 12.8 oz (88.814 kg)  BMI 28.09 kg/m2  SpO2 96%, BMI Body mass index is 28.09 kg/(m^2).  Wt Readings from Last 3 Encounters:  02/24/15 195 lb 12.8 oz (88.814 kg)  12/17/14 199 lb (90.266 kg)  09/02/14 205 lb (92.987 kg)     Overweight male, comfortable at rest.  HEENT: Conjunctiva and lids normal, oropharynx clear.  Neck: Supple, no elevated JVP or carotid bruits, no thyromegaly.  Lungs: Clear to auscultation, nonlabored breathing at rest.  Cardiac: RRR, no S3 or significant systolic murmur, no pericardial rub.  Abdomen: Soft, nontender, bowel sounds present, no guarding or rebound.  Extremities: No pitting edema, distal pulses 2+.    ECG: ECG is not ordered today.  Other Studies Reviewed Today:  Echocardiogram from August 2015 reported mild to moderate LVH with LVEF 60-65%, abnormal diastolic function, no major valvular abnormalities, RVSP 28 mm mercury, no pericardial effusion.   Lexiscan Cardiolite on August 2015 reported a mild basal inferior fixed defect, no active ischemia, LVEF 59%.  Assessment and Plan:  1. Paroxysmal atrial fibrillation, symptomatically well controlled, continues on beta blocker and Coumadin. No changes were made today.  2.  Essential hypertension, blood pressure well controlled today.  Current medicines were reviewed with the patient today.   Disposition: FU with me in 6 months.   Signed, Satira Sark, MD, [facility] 02/24/2015 1:17 PM    [facility], [place], [place] [address] Phone: [phone]; Fax: [phone]

## 2015-02-24 NOTE — Patient Instructions (Signed)
Your physician recommends that you continue on your current medications as directed. Please refer to the Current Medication list given to you today. Your physician recommends that you schedule a follow-up appointment in: 6 months. You will receive a reminder letter in the mail in about 4 months reminding you to call and schedule your appointment. If you don't receive this letter, please contact our office. 

## 2015-03-04 ENCOUNTER — Ambulatory Visit (INDEPENDENT_AMBULATORY_CARE_PROVIDER_SITE_OTHER): Payer: Medicare Other | Admitting: *Deleted

## 2015-03-04 DIAGNOSIS — Z5181 Encounter for therapeutic drug level monitoring: Secondary | ICD-10-CM | POA: Diagnosis not present

## 2015-03-04 DIAGNOSIS — I4891 Unspecified atrial fibrillation: Secondary | ICD-10-CM

## 2015-03-04 LAB — POCT INR: INR: 2.6

## 2015-03-19 ENCOUNTER — Telehealth: Payer: Self-pay | Admitting: Cardiology

## 2015-03-19 NOTE — Telephone Encounter (Signed)
Mr. Daniel Schaefer called stating that he saw Dr. Dorris Fetch today. Dr. Dorris Fetch is wanting to change Amlodipine 5mg  to 10 mg. Mr. Hy wants to verify with Dr. Domenic Polite.  Please call [phone]

## 2015-03-20 NOTE — Telephone Encounter (Signed)
Patient called office to inform Dr. Domenic Polite that his endocrinologist wants to increase his amlodipine to 10 mg daily instead of 5 mg. Patient said that his BP is always high when he sees Dr. Dorris Fetch every 3 months. Patient said his BP was 189/89 on yesterday when he was there. Patient said he does not have access to check his blood pressure when he is at home. Nurse advised patient to come to the office today at noon to have his blood pressure checked.

## 2015-03-20 NOTE — Telephone Encounter (Signed)
Patient came to office today to have his blood pressure checked. See details below.  Manual (left arm) BP 136/78 Automatic (left arm) BP 121/73

## 2015-03-20 NOTE — Telephone Encounter (Signed)
Noted. Reasonable recommendation to increase medication if his blood pressure remains elevated. He may want to check on getting a blood pressure cuff to use at home to follow this more closely.

## 2015-03-21 NOTE — Telephone Encounter (Signed)
Those blood pressures look better. Would encourage him to get a blood pressure cuff that he can use at home and check his blood pressure periodically. This might help guide further medication adjustments.

## 2015-03-21 NOTE — Telephone Encounter (Signed)
Patient informed and verbalized understanding of plan. 

## 2015-04-09 ENCOUNTER — Other Ambulatory Visit: Payer: Self-pay | Admitting: Cardiology

## 2015-04-15 ENCOUNTER — Ambulatory Visit (INDEPENDENT_AMBULATORY_CARE_PROVIDER_SITE_OTHER): Payer: Medicare Other | Admitting: *Deleted

## 2015-04-15 DIAGNOSIS — I4891 Unspecified atrial fibrillation: Secondary | ICD-10-CM | POA: Diagnosis not present

## 2015-04-15 DIAGNOSIS — Z5181 Encounter for therapeutic drug level monitoring: Secondary | ICD-10-CM

## 2015-04-15 LAB — POCT INR: INR: 2.2

## 2015-05-27 ENCOUNTER — Other Ambulatory Visit: Payer: Self-pay | Admitting: *Deleted

## 2015-05-27 ENCOUNTER — Ambulatory Visit (INDEPENDENT_AMBULATORY_CARE_PROVIDER_SITE_OTHER): Payer: Medicare Other | Admitting: *Deleted

## 2015-05-27 DIAGNOSIS — Z5181 Encounter for therapeutic drug level monitoring: Secondary | ICD-10-CM | POA: Diagnosis not present

## 2015-05-27 DIAGNOSIS — I4891 Unspecified atrial fibrillation: Secondary | ICD-10-CM | POA: Diagnosis not present

## 2015-05-27 LAB — POCT INR: INR: 2.9

## 2015-05-27 MED ORDER — FUROSEMIDE 20 MG PO TABS: 20.0000 mg | ORAL_TABLET | Freq: Every day | ORAL | Status: DC

## 2015-06-19 ENCOUNTER — Ambulatory Visit (INDEPENDENT_AMBULATORY_CARE_PROVIDER_SITE_OTHER): Payer: Medicare Other | Admitting: "Endocrinology

## 2015-06-19 ENCOUNTER — Encounter: Payer: Self-pay | Admitting: "Endocrinology

## 2015-06-19 VITALS — BP 170/80 | HR 54 | Ht 68.0 in | Wt 189.0 lb

## 2015-06-19 DIAGNOSIS — Z794 Long term (current) use of insulin: Secondary | ICD-10-CM | POA: Diagnosis not present

## 2015-06-19 DIAGNOSIS — E119 Type 2 diabetes mellitus without complications: Secondary | ICD-10-CM | POA: Diagnosis not present

## 2015-06-19 DIAGNOSIS — I10 Essential (primary) hypertension: Secondary | ICD-10-CM | POA: Diagnosis not present

## 2015-06-19 NOTE — Progress Notes (Signed)
Subjective:    Patient ID: Daniel Schaefer, male    DOB: [birth year],    Past Medical History  Diagnosis Date  . Atrial fibrillation [facility])     Reportedly diagnosed November 2011  . Type 2 diabetes mellitus ([place])   . Essential hypertension, benign   . Prostate cancer [facility])     Radiation implants   Past Surgical History  Procedure Laterality Date  . Prostate biopsy    . Gold seed placement     Social History   Social History  . Marital Status: Divorced    Spouse Name: N/A  . Number of Children: N/A  . Years of Education: N/A   Social History Main Topics  . Smoking status: Never Smoker   . Smokeless tobacco: Never Used  . Alcohol Use: 0.0 oz/week    0 Standard drinks or equivalent per week     Comment: Occassional  . Drug Use: No  . Sexual Activity: Not Asked   Other Topics Concern  . None   Social History Narrative   Outpatient Encounter Prescriptions as of 06/19/2015  Medication Sig  . potassium chloride (K-DUR) 10 MEQ tablet Take 1 tablet (10 mEq total) by mouth daily.  . tadalafil (CIALIS) 20 MG tablet Take 20 mg by mouth daily as needed for erectile dysfunction.  Marland Kitchen warfarin (COUMADIN) 5 MG tablet Take 1-1.5 tablets (5-7.5 mg total) by mouth daily.  Marland Kitchen acetaminophen (TYLENOL) 500 MG tablet Take 500 mg by mouth every 6 (six) hours as needed.  Marland Kitchen amLODipine (NORVASC) 10 MG tablet Take 10 mg by mouth daily.  . furosemide (LASIX) 20 MG tablet Take 1 tablet (20 mg total) by mouth daily.  Marland Kitchen lisinopril (PRINIVIL,ZESTRIL) 20 MG tablet Take 20 mg by mouth daily.  Marland Kitchen loratadine (CLARITIN) 10 MG tablet Take 10 mg by mouth daily.  . metFORMIN (GLUCOPHAGE) 1000 MG tablet Take 1,000 mg by mouth 2 (two) times daily with a meal.  . metoprolol tartrate (LOPRESSOR) 25 MG tablet Take 1 tablet (25 mg total) by mouth 2 (two) times daily.  Marland Kitchen omeprazole (PRILOSEC) 20 MG capsule Take 20 mg by mouth daily.  . pravastatin (PRAVACHOL) 20 MG tablet Take 20 mg by mouth daily.  . traZODone  (DESYREL) 50 MG tablet Take 50 mg by mouth at bedtime.   No facility-administered encounter medications on file as of 06/19/2015.   ALLERGIES: No Known Allergies VACCINATION STATUS:  There is no immunization history on file for this patient.  Diabetes He presents for his follow-up diabetic visit. He has type 2 diabetes mellitus. Onset time: He was diagnosed at approximate age of 30 years. There are no hypoglycemic associated symptoms. Pertinent negatives for hypoglycemia include no confusion, headaches, pallor or seizures. There are no diabetic associated symptoms. Pertinent negatives for diabetes include no chest pain, no fatigue, no polydipsia, no polyphagia, no polyuria and no weakness. There are no hypoglycemic complications. Symptoms are stable. There are no diabetic complications. Risk factors for coronary artery disease include hypertension and dyslipidemia. Current diabetic treatment includes oral agent (dual therapy). He is compliant with treatment most of the time. His weight is stable. His overall blood glucose range is 130-140 mg/dl. An ACE inhibitor/angiotensin II receptor blocker is being taken.  Hypertension This is a chronic problem. The current episode started more than 1 year ago. Pertinent negatives include no chest pain, headaches, neck pain, palpitations or shortness of breath.  Hyperlipidemia This is a chronic problem. The current episode started more than 1  year ago. Pertinent negatives include no chest pain, myalgias or shortness of breath. Current antihyperlipidemic treatment includes statins.     Review of Systems  Constitutional: Negative for fatigue and unexpected weight change.  HENT: Negative for dental problem, mouth sores and trouble swallowing.   Eyes: Negative for visual disturbance.  Respiratory: Negative for cough, choking, chest tightness, shortness of breath and wheezing.   Cardiovascular: Negative for chest pain, palpitations and leg swelling.   Gastrointestinal: Negative for nausea, vomiting, abdominal pain, diarrhea, constipation and abdominal distention.  Endocrine: Negative for polydipsia, polyphagia and polyuria.  Genitourinary: Negative for dysuria, urgency, hematuria and flank pain.  Musculoskeletal: Negative for myalgias, back pain, gait problem and neck pain.  Skin: Negative for pallor, rash and wound.  Neurological: Negative for seizures, syncope, weakness, numbness and headaches.  Psychiatric/Behavioral: Negative.  Negative for confusion and dysphoric mood.    Objective:    BP 170/80 mmHg  Pulse 54  Ht 5\' 8"  (1.727 m)  Wt 189 lb (85.73 kg)  BMI 28.74 kg/m2  SpO2 95%  Wt Readings from Last 3 Encounters:  06/19/15 189 lb (85.73 kg)  02/24/15 195 lb 12.8 oz (88.814 kg)  12/17/14 199 lb (90.266 kg)    Physical Exam  Constitutional: He is oriented to person, place, and time. He appears well-developed and well-nourished. He is cooperative. No distress.  HENT:  Head: Normocephalic and atraumatic.  Eyes: EOM are normal.  Neck: Normal range of motion. Neck supple. No tracheal deviation present. No thyromegaly present.  Cardiovascular: Normal rate, S1 normal, S2 normal and normal heart sounds.  Exam reveals no gallop.   No murmur heard. Pulses:      Dorsalis pedis pulses are 1+ on the right side, and 1+ on the left side.       Posterior tibial pulses are 1+ on the right side, and 1+ on the left side.  Pulmonary/Chest: Breath sounds normal. No respiratory distress. He has no wheezes.  Abdominal: Soft. Bowel sounds are normal. He exhibits no distension. There is no tenderness. There is no guarding and no CVA tenderness.  Musculoskeletal: He exhibits no edema.       Right shoulder: He exhibits no swelling and no deformity.  Neurological: He is alert and oriented to person, place, and time. He has normal strength and normal reflexes. No cranial nerve deficit or sensory deficit. Gait normal.  Skin: Skin is warm and dry.  No rash noted. No cyanosis. Nails show no clubbing.  Psychiatric: He has a normal mood and affect. His speech is normal and behavior is normal. Judgment and thought content normal. Cognition and memory are normal.    Results for orders placed or performed in visit on 05/27/15  POCT INR  Result Value Ref Range   INR 2.9    Complete Blood Count (Most recent): Lab Results  Component Value Date   WBC 6.6 08/31/2013   HGB 14.0 08/31/2013   HCT 40.1 08/31/2013   MCV 75.9* 08/31/2013   PLT 235 08/31/2013   Chemistry (most recent): Lab Results  Component Value Date   NA 140 08/31/2013   K 4.9 08/31/2013   CL 102 08/31/2013   CO2 29 08/31/2013   BUN 15 08/31/2013   CREATININE 1.06 08/31/2013   Diabetic Labs (most recent): No results found for: HGBA1C Lipid profile (most recent): No results found for: TRIG, CHOL       Assessment & Plan:   1. Type 2 diabetes mellitus without complication, with long-term current use of  insulin ([place])  Patient came with improving glucose profile, and  recent A1c of 6.1 %.  Glucose logs and insulin administration records pertaining to this visit,  to be scanned into patient's records.  Recent labs reviewed. -Patient remains at a high risk for more acute and chronic complications of diabetes which include CAD, CVA, CKD, retinopathy, and neuropathy. These are all discussed in detail with the patient. -I have re-counseled the patient on diet management and weight loss, by adopting a carbohydrate restricted / protein rich  Diet. Patient is advised to stick to a routine mealtimes to eat 3 meals  a day and avoid unnecessary snacks ( to snack only to correct hypoglycemia).  -Suggestion is made for patient to avoid simple carbohydrates   from their diet including Cakes , Desserts, Ice Cream,  Soda (  diet and regular) , Sweet Tea , Candies,  Chips, Cookies, Artificial Sweeteners,   and "Sugar-free" Products .  This will help patient to have stable blood glucose  profile and potentially lose weight.  -The patient is given individualized DM education. -I have approached patient to continue on  monitoring of blood glucose before breakfast  -Patient is encouraged to call clinic for blood glucose levels less than 70 or above 300 mg /dl. - I will continue metformin 1 g by mouth twice a day, therapeutically suitable for patient.. - He will not need insulin therapy for now.  - Patient will be considered for incretin therapy as appropriate next visit. - Patient specific target  for A1c; LDL, HDL, Triglycerides, and  Waist Circumference were discussed in detail.  2) BP/HTN: Controlled. Continue current medications including ACEI. 3) Lipids/HPL:  continue statins. 4)  Weight/Diet:  exercise, and carbohydrates information provided.  5) Chronic Care/Health Maintenance: -Patient on ACEI and Statin medications and encouraged to continue to follow up with Ophthalmology, Podiatrist at least yearly or according to recommendations, and advised to stay away from smoking. I have recommended yearly flu vaccine and pneumonia vaccination at least every 5 years; moderate intensity exercise for up to 150 minutes weekly; and  sleep for at least 7 hours a day.  Patient to bring meter and  blood glucose logs during their next visit.  Follow up plan: Return in about 6 months (around 12/18/2015) for diabetes, high blood pressure, high cholesterol, follow up with pre-visit labs.  Glade Lloyd, MD Phone: [phone]  Fax: [phone]   06/19/2015, 12:00 PM

## 2015-07-08 ENCOUNTER — Ambulatory Visit (INDEPENDENT_AMBULATORY_CARE_PROVIDER_SITE_OTHER): Payer: Medicare Other | Admitting: *Deleted

## 2015-07-08 DIAGNOSIS — I4891 Unspecified atrial fibrillation: Secondary | ICD-10-CM

## 2015-07-08 DIAGNOSIS — Z5181 Encounter for therapeutic drug level monitoring: Secondary | ICD-10-CM | POA: Diagnosis not present

## 2015-07-08 LAB — POCT INR: INR: 2.3

## 2015-07-13 ENCOUNTER — Other Ambulatory Visit: Payer: Self-pay | Admitting: Cardiology

## 2015-08-19 ENCOUNTER — Ambulatory Visit (INDEPENDENT_AMBULATORY_CARE_PROVIDER_SITE_OTHER): Payer: Medicare Other | Admitting: *Deleted

## 2015-08-19 DIAGNOSIS — Z5181 Encounter for therapeutic drug level monitoring: Secondary | ICD-10-CM | POA: Diagnosis not present

## 2015-08-19 DIAGNOSIS — I4891 Unspecified atrial fibrillation: Secondary | ICD-10-CM | POA: Diagnosis not present

## 2015-08-19 LAB — POCT INR: INR: 2.5

## 2015-08-26 ENCOUNTER — Ambulatory Visit (INDEPENDENT_AMBULATORY_CARE_PROVIDER_SITE_OTHER): Payer: Medicare Other | Admitting: Cardiology

## 2015-08-26 ENCOUNTER — Encounter: Payer: Self-pay | Admitting: Cardiology

## 2015-08-26 VITALS — BP 123/78 | HR 59 | Ht 70.0 in | Wt 189.6 lb

## 2015-08-26 DIAGNOSIS — I10 Essential (primary) hypertension: Secondary | ICD-10-CM

## 2015-08-26 DIAGNOSIS — N529 Male erectile dysfunction, unspecified: Secondary | ICD-10-CM

## 2015-08-26 DIAGNOSIS — I480 Paroxysmal atrial fibrillation: Secondary | ICD-10-CM | POA: Diagnosis not present

## 2015-08-26 NOTE — Patient Instructions (Signed)
Your physician recommends that you continue on your current medications as directed. Please refer to the Current Medication list given to you today. Your physician recommends that you schedule a follow-up appointment in: 6 months. You will receive a reminder letter in the mail in about 4 months reminding you to call and schedule your appointment. If you don't receive this letter, please contact our office. 

## 2015-08-26 NOTE — Progress Notes (Signed)
Cardiology Office Note  Date: 08/26/2015   Daniel Schaefer, DOB [birth year], MRN [id number]  PCP: Deloria Lair, Daniel Schaefer  Daniel Cardiologist: Rozann Lesches, Daniel Schaefer   Chief Complaint  Patient presents with  . Atrial Fibrillation    History of Present Illness: Daniel Schaefer is a 64 y.o. male last seen in June. He presents for a routine follow-up visit. He does not endorse any progressing palpitations, no exertional chest pain. He is just now getting over an upper respiratory tract infection, upper to last week and been walking regularly outdoors for exercise. He reports NYHA class II dyspnea.  He continues on Coumadin with follow-up in anticoagulation clinic. Recent INR was 2.5. No reported bleeding problems.  We reviewed his medications which are outlined below. He states that he stopped taking potassium because it was making him have diarrhea. He is interested in trying to cut back his Lasix. Otherwise he continues on Coumadin, Norvasc, Lopressor, and lisinopril.  He has erectile dysfunction and has been using Cialis, prescribed by Daniel Schaefer. He wanted to try and switch to generic Viagra. I told him that he did not have any specific cardiac contraindications to using these medications and I would relay this information to Daniel Schaefer so that he could arrange change in treatment.  Past Medical History  Diagnosis Date  . Atrial fibrillation [facility])     Reportedly diagnosed November 2011  . Type 2 diabetes mellitus ([place])   . Essential hypertension, benign   . Prostate cancer [facility])     Radiation implants    Current Outpatient Prescriptions  Medication Sig Dispense Refill  . acetaminophen (TYLENOL) 500 MG tablet Take 500 mg by mouth every 6 (six) hours as needed.    Marland Kitchen amLODipine (NORVASC) 10 MG tablet Take 10 mg by mouth daily.    . furosemide (LASIX) 20 MG tablet Take 1 tablet (20 mg total) by mouth daily. 30 tablet 6  . lisinopril (PRINIVIL,ZESTRIL) 20 MG tablet Take 20 mg by  mouth daily.    Marland Kitchen loratadine (CLARITIN) 10 MG tablet Take 10 mg by mouth daily.    . metFORMIN (GLUCOPHAGE) 1000 MG tablet Take 1,000 mg by mouth 2 (two) times daily with a meal.    . metoprolol tartrate (LOPRESSOR) 25 MG tablet TAKE ONE TABLET BY MOUTH TWICE DAILY 60 tablet 3  . omeprazole (PRILOSEC) 20 MG capsule Take 20 mg by mouth daily.    . pravastatin (PRAVACHOL) 20 MG tablet Take 20 mg by mouth daily.    . tadalafil (CIALIS) 20 MG tablet Take 20 mg by mouth daily as needed for erectile dysfunction.    . traZODone (DESYREL) 50 MG tablet Take 50 mg by mouth at bedtime.    Marland Kitchen warfarin (COUMADIN) 5 MG tablet Take 1-1.5 tablets (5-7.5 mg total) by mouth daily. 45 tablet 3   No current facility-administered medications for this visit.   Allergies:  Review of patient's allergies indicates no known allergies.   Social History: The patient  reports that he has never smoked. He has never used smokeless tobacco. He reports that he drinks alcohol. He reports that he does not use illicit drugs.   ROS:  Please see the history of present illness. Otherwise, complete review of systems is positive for occasional nausea and gas.  All other systems are reviewed and negative.   Physical Exam: VS:  BP 123/78 mmHg  Pulse 59  Ht 5\' 10"  (1.778 m)  Wt 189 lb 9.6 oz (86.002 kg)  BMI 27.20 kg/m2  SpO2 97%, BMI Body mass index is 27.2 kg/(m^2).  Wt Readings from Last 3 Encounters:  08/26/15 189 lb 9.6 oz (86.002 kg)  06/19/15 189 lb (85.73 kg)  02/24/15 195 lb 12.8 oz (88.814 kg)    Overweight male, comfortable at rest.  HEENT: Conjunctiva and lids normal, oropharynx clear.  Neck: Supple, no elevated JVP or carotid bruits, no thyromegaly.  Lungs: Clear to auscultation, nonlabored breathing at rest.  Cardiac: RRR, no S3 or significant systolic murmur, no pericardial rub.  Abdomen: Soft, nontender, bowel sounds present, no guarding or rebound.  Extremities: No pitting edema, distal pulses 2+.    ECG: ECG is not ordered today.  Other Studies Reviewed Today:  Echocardiogram from August 2015 reported mild to moderate LVH with LVEF 60-65%, abnormal diastolic function, no major valvular abnormalities, RVSP 28 mm mercury, no pericardial effusion.   Lexiscan Cardiolite on August 2015 reported a mild basal inferior fixed defect, no active ischemia, LVEF 59%.  Assessment and Plan:  1. Paroxysmal atrial fibrillation. Continue current medical regimen including Lopressor and Coumadin.  2. Essential hypertension, blood pressure is well controlled today. He will continue on his present antihypertensives, although I do think it would be reasonable for him to wean back on the Lasix as long as he is symptomatically stable, particularly since he has had to stop potassium supplements.  3. Erectile dysfunction. No direct contraindications to using medications such as Viagra or Cialis, which he states he has tolerated. He will continue to follow with Daniel Schaefer for adjustments in this medical regimen.  Current medicines were reviewed with the patient today.  Disposition: FU with me in 6 months.   Signed, Daniel Sark, Daniel Schaefer, [facility] 08/26/2015 2:31 PM    [facility] [place], [place], [place] [address] Phone: [phone]; Fax: [phone]

## 2015-09-03 ENCOUNTER — Other Ambulatory Visit: Payer: Self-pay | Admitting: Cardiology

## 2015-09-05 ENCOUNTER — Other Ambulatory Visit: Payer: Self-pay | Admitting: *Deleted

## 2015-09-05 MED ORDER — WARFARIN SODIUM 5 MG PO TABS: 5.0000 mg | ORAL_TABLET | Freq: Every day | ORAL | Status: DC

## 2015-09-30 ENCOUNTER — Ambulatory Visit (INDEPENDENT_AMBULATORY_CARE_PROVIDER_SITE_OTHER): Payer: Medicare Other | Admitting: Pharmacist

## 2015-09-30 DIAGNOSIS — Z5181 Encounter for therapeutic drug level monitoring: Secondary | ICD-10-CM | POA: Diagnosis not present

## 2015-09-30 DIAGNOSIS — I4891 Unspecified atrial fibrillation: Secondary | ICD-10-CM

## 2015-09-30 LAB — POCT INR: INR: 2.5

## 2015-11-10 ENCOUNTER — Other Ambulatory Visit: Payer: Self-pay | Admitting: Cardiology

## 2015-11-13 ENCOUNTER — Ambulatory Visit (INDEPENDENT_AMBULATORY_CARE_PROVIDER_SITE_OTHER): Payer: Medicare Other | Admitting: *Deleted

## 2015-11-13 DIAGNOSIS — I4891 Unspecified atrial fibrillation: Secondary | ICD-10-CM

## 2015-11-13 DIAGNOSIS — Z5181 Encounter for therapeutic drug level monitoring: Secondary | ICD-10-CM

## 2015-11-13 LAB — POCT INR: INR: 4.6

## 2015-11-19 ENCOUNTER — Telehealth: Payer: Self-pay | Admitting: *Deleted

## 2015-11-19 NOTE — Telephone Encounter (Signed)
Started holding coumadin 5 days ago. Want to know when to come for next INR check.

## 2015-11-20 NOTE — Telephone Encounter (Signed)
Spoke with pt.  He is off coumadin for knee arthroscopy tomorrow.  He will call for INR appt 1 wk after he restarts coumadin.

## 2015-11-20 NOTE — Telephone Encounter (Signed)
Mr. Bazil called the [facility] office requesting to speak with Cristopher Peru.

## 2015-12-23 ENCOUNTER — Other Ambulatory Visit: Payer: Self-pay | Admitting: Cardiology

## 2015-12-25 ENCOUNTER — Encounter: Payer: Self-pay | Admitting: "Endocrinology

## 2015-12-25 ENCOUNTER — Ambulatory Visit (INDEPENDENT_AMBULATORY_CARE_PROVIDER_SITE_OTHER): Payer: Medicare Other | Admitting: "Endocrinology

## 2015-12-25 VITALS — BP 149/84 | HR 64 | Ht 70.0 in | Wt 178.0 lb

## 2015-12-25 DIAGNOSIS — E785 Hyperlipidemia, unspecified: Secondary | ICD-10-CM

## 2015-12-25 DIAGNOSIS — Z794 Long term (current) use of insulin: Secondary | ICD-10-CM

## 2015-12-25 DIAGNOSIS — I10 Essential (primary) hypertension: Secondary | ICD-10-CM

## 2015-12-25 DIAGNOSIS — E1159 Type 2 diabetes mellitus with other circulatory complications: Secondary | ICD-10-CM | POA: Diagnosis not present

## 2015-12-25 DIAGNOSIS — E782 Mixed hyperlipidemia: Secondary | ICD-10-CM | POA: Insufficient documentation

## 2015-12-25 NOTE — Progress Notes (Signed)
Subjective:    Patient ID: Daniel Schaefer, male    DOB: 1950/12/17,    Past Medical History  Diagnosis Date  . Atrial fibrillation [facility])     Reportedly diagnosed November 2011  . Type 2 diabetes mellitus ([place])   . Essential hypertension, benign   . Prostate cancer [facility])     Radiation implants   Past Surgical History  Procedure Laterality Date  . Prostate biopsy    . Gold seed placement     Social History   Social History  . Marital Status: Divorced    Spouse Name: N/A  . Number of Children: N/A  . Years of Education: N/A   Social History Main Topics  . Smoking status: Never Smoker   . Smokeless tobacco: Never Used  . Alcohol Use: 0.0 oz/week    0 Standard drinks or equivalent per week     Comment: Occassional  . Drug Use: No  . Sexual Activity: Not Asked   Other Topics Concern  . None   Social History Narrative   Outpatient Encounter Prescriptions as of 12/25/2015  Medication Sig  . amLODipine (NORVASC) 10 MG tablet Take 10 mg by mouth daily.  . ferrous gluconate (FERGON) 324 MG tablet Take 324 mg by mouth daily with breakfast.  . furosemide (LASIX) 20 MG tablet TAKE ONE TABLET BY MOUTH ONCE DAILY  . lisinopril (PRINIVIL,ZESTRIL) 20 MG tablet Take 20 mg by mouth daily.  . Loratadine (CLARITIN) 10 MG CAPS Take by mouth.  . metFORMIN (GLUCOPHAGE) 1000 MG tablet Take 1,000 mg by mouth 2 (two) times daily with a meal.  . metoprolol tartrate (LOPRESSOR) 25 MG tablet TAKE ONE TABLET BY MOUTH TWICE DAILY  . omeprazole (PRILOSEC) 20 MG capsule Take 20 mg by mouth daily.  Marland Kitchen oxyCODONE-acetaminophen (PERCOCET/ROXICET) 5-325 MG tablet Take 1 tablet by mouth every 4 (four) hours as needed for severe pain.  Marland Kitchen Potassium Chloride (KLOR-CON 10 PO) Take by mouth.  . sildenafil (REVATIO) 20 MG tablet Take 20 mg by mouth as needed.  . tamsulosin (FLOMAX) 0.4 MG CAPS capsule Take 0.4 mg by mouth.  Marland Kitchen acetaminophen (TYLENOL) 500 MG tablet Take 500 mg by mouth every 6 (six) hours  as needed.  . loratadine (CLARITIN) 10 MG tablet Take 10 mg by mouth daily.  . pravastatin (PRAVACHOL) 20 MG tablet Take 20 mg by mouth daily.  . sildenafil (REVATIO) 20 MG tablet Take 20 mg by mouth as needed.  . traZODone (DESYREL) 50 MG tablet Take 50 mg by mouth at bedtime.  Marland Kitchen warfarin (COUMADIN) 5 MG tablet Take 1-1.5 tablets (5-7.5 mg total) by mouth daily.   No facility-administered encounter medications on file as of 12/25/2015.   ALLERGIES: No Known Allergies VACCINATION STATUS:  There is no immunization history on file for this patient.  Diabetes He presents for his follow-up diabetic visit. He has type 2 diabetes mellitus. Onset time: He was diagnosed at approximate age of 17 years. His disease course has been worsening (Since his last visit, he developed Coumadin toxicity which resulted in left knee hemarthrosis which required drainage and currently using a walker while doing physical therapy.). There are no hypoglycemic associated symptoms. Pertinent negatives for hypoglycemia include no confusion, headaches, pallor or seizures. There are no diabetic associated symptoms. Pertinent negatives for diabetes include no chest pain, no fatigue, no polydipsia, no polyphagia, no polyuria and no weakness. There are no hypoglycemic complications. Symptoms are worsening. There are no diabetic complications. Risk factors for coronary  artery disease include hypertension, dyslipidemia and sedentary lifestyle. Current diabetic treatment includes oral agent (dual therapy). He is compliant with treatment most of the time. His weight is decreasing steadily. His breakfast blood glucose range is generally 140-180 mg/dl. His highest blood glucose is 140-180 mg/dl. An ACE inhibitor/angiotensin II receptor blocker is being taken.  Hypertension This is a chronic problem. The current episode started more than 1 year ago. Pertinent negatives include no chest pain, headaches, neck pain, palpitations or shortness  of breath.  Hyperlipidemia This is a chronic problem. The current episode started more than 1 year ago. Pertinent negatives include no chest pain, myalgias or shortness of breath. Current antihyperlipidemic treatment includes statins.     Review of Systems  Constitutional: Negative for fatigue and unexpected weight change.  HENT: Negative for dental problem, mouth sores and trouble swallowing.   Eyes: Negative for visual disturbance.  Respiratory: Negative for cough, choking, chest tightness, shortness of breath and wheezing.   Cardiovascular: Negative for chest pain, palpitations and leg swelling.  Gastrointestinal: Negative for nausea, vomiting, abdominal pain, diarrhea, constipation and abdominal distention.  Endocrine: Negative for polydipsia, polyphagia and polyuria.  Genitourinary: Negative for dysuria, urgency, hematuria and flank pain.  Musculoskeletal: Positive for gait problem. Negative for myalgias, back pain and neck pain.  Skin: Negative for pallor, rash and wound.  Neurological: Negative for seizures, syncope, weakness, numbness and headaches.  Psychiatric/Behavioral: Negative.  Negative for confusion and dysphoric mood.    Objective:    BP 149/84 mmHg  Pulse 64  Ht 5\' 10"  (1.778 m)  Wt 178 lb (80.74 kg)  BMI 25.54 kg/m2  SpO2 98%  Wt Readings from Last 3 Encounters:  12/25/15 178 lb (80.74 kg)  08/26/15 189 lb 9.6 oz (86.002 kg)  06/19/15 189 lb (85.73 kg)    Physical Exam  Constitutional: He is oriented to person, place, and time. He appears well-developed and well-nourished. He is cooperative. No distress.  He uses a walker while doing physical therapy due to his recent left knee hemarthrosis due to Coumadin toxicity  HENT:  Head: Normocephalic and atraumatic.  Eyes: EOM are normal.  Neck: Normal range of motion. Neck supple. No tracheal deviation present. No thyromegaly present.  Cardiovascular: Normal rate, S1 normal, S2 normal and normal heart sounds.   Exam reveals no gallop.   No murmur heard. Pulses:      Dorsalis pedis pulses are 1+ on the right side, and 1+ on the left side.       Posterior tibial pulses are 1+ on the right side, and 1+ on the left side.  Pulmonary/Chest: Breath sounds normal. No respiratory distress. He has no wheezes.  Abdominal: Soft. Bowel sounds are normal. He exhibits no distension. There is no tenderness. There is no guarding and no CVA tenderness.  Musculoskeletal: He exhibits no edema.       Right shoulder: He exhibits no swelling and no deformity.  Neurological: He is alert and oriented to person, place, and time. He has normal strength and normal reflexes. No cranial nerve deficit or sensory deficit. Gait normal.  Skin: Skin is warm and dry. No rash noted. No cyanosis. Nails show no clubbing.  Psychiatric: He has a normal mood and affect. His speech is normal and behavior is normal. Judgment and thought content normal. Cognition and memory are normal.    Results for orders placed or performed in visit on 11/13/15  POCT INR  Result Value Ref Range   INR 4.6  Complete Blood Count (Most recent): Lab Results  Component Value Date   WBC 6.6 08/31/2013   HGB 14.0 08/31/2013   HCT 40.1 08/31/2013   MCV 75.9* 08/31/2013   PLT 235 08/31/2013   Chemistry (most recent): Lab Results  Component Value Date   NA 140 08/31/2013   K 4.9 08/31/2013   CL 102 08/31/2013   CO2 29 08/31/2013   BUN 15 08/31/2013   CREATININE 1.06 08/31/2013      Assessment & Plan:   1. Type 2 diabetes mellitus without complication, with long-term current use of insulin ([place])  Patient came with Slight worsening of his glucose profile, and  recent  labs did not include A1c, his last A1c was 6.1 %.  Glucose logs and insulin administration records pertaining to this visit,  to be scanned into patient's records.  Recent labs reviewed. -Patient remains at a high risk for more acute and chronic complications of diabetes which  include CAD, CVA, CKD, retinopathy, and neuropathy. These are all discussed in detail with the patient. -I have re-counseled the patient on diet management and weight loss, by adopting a carbohydrate restricted / protein rich  Diet. Patient is advised to stick to a routine mealtimes to eat 3 meals  a day and avoid unnecessary snacks ( to snack only to correct hypoglycemia).  -Suggestion is made for patient to avoid simple carbohydrates   from their diet including Cakes , Desserts, Ice Cream,  Soda (  diet and regular) , Sweet Tea , Candies,  Chips, Cookies, Artificial Sweeteners,   and "Sugar-free" Products .  This will help patient to have stable blood glucose profile and potentially lose weight.  -The patient is given individualized DM education. -I have approached patient to continue on  monitoring of blood glucose before breakfast  -Patient is encouraged to call clinic for blood glucose levels less than 70 or above 300 mg /dl. - I will continue metformin 1 g by mouth twice a day, therapeutically suitable for patient. He is hesitant to add new medications, if he continues to lose control he will be considered for either a basal insulin or DPP IV inhibitor. - He will not need insulin therapy for now.  - Patient is not a suitable candidate for incretin therapy  Nor SGLT2i.  - Patient specific target  for A1c; LDL, HDL, Triglycerides, and  Waist Circumference were discussed in detail.  2) BP/HTN: Controlled. Continue current medications including ACEI. 3) Lipids/HPL:  continue statins. 4)  Weight/Diet:  exercise, and carbohydrates information provided.  5) Chronic Care/Health Maintenance: -Patient on ACEI and Statin medications and encouraged to continue to follow up with Ophthalmology, Podiatrist at least yearly or according to recommendations, and advised to stay away from smoking. I have recommended yearly flu vaccine and pneumonia vaccination at least every 5 years; moderate intensity  exercise for up to 150 minutes weekly; and  sleep for at least 7 hours a day.  Patient to bring meter and  blood glucose logs during their next visit.  Follow up plan: Return in about 3 months (around 03/25/2016) for diabetes, high blood pressure, high cholesterol, follow up with pre-visit labs, meter, and logs.  Glade Lloyd, MD Phone: [phone]  Fax: [phone]   12/25/2015, 11:29 AM

## 2016-01-13 ENCOUNTER — Encounter: Payer: Self-pay | Admitting: "Endocrinology

## 2016-02-17 ENCOUNTER — Encounter: Payer: Self-pay | Admitting: *Deleted

## 2016-02-18 ENCOUNTER — Encounter: Payer: Self-pay | Admitting: Cardiology

## 2016-02-18 ENCOUNTER — Ambulatory Visit (INDEPENDENT_AMBULATORY_CARE_PROVIDER_SITE_OTHER): Payer: Medicare Other | Admitting: Cardiology

## 2016-02-18 VITALS — BP 140/82 | HR 58 | Ht 70.0 in | Wt 185.0 lb

## 2016-02-18 DIAGNOSIS — I480 Paroxysmal atrial fibrillation: Secondary | ICD-10-CM

## 2016-02-18 DIAGNOSIS — I10 Essential (primary) hypertension: Secondary | ICD-10-CM | POA: Diagnosis not present

## 2016-02-18 DIAGNOSIS — Z01810 Encounter for preprocedural cardiovascular examination: Secondary | ICD-10-CM | POA: Diagnosis not present

## 2016-02-18 NOTE — Progress Notes (Signed)
Cardiology Office Note  Date: 02/18/2016   ID: Daniel Schaefer, DOB 03-20-1951, MRN [id number]  PCP: Deloria Lair, MD  Primary Cardiologist: Rozann Lesches, MD   Chief Complaint  Patient presents with  . Preoperative evaluation    History of Present Illness: Daniel Schaefer is a 65 y.o. male last seen in December 2016. He presents for a follow-up visit. Communication received from Dr. Exie Parody at [facility] regarding planned treatment of renal stones with ESWL. Patient is under the impression that this needs to be done in the near future.  He has been stable from a cardiac perspective without significant recurring palpitations. ECG today shows sinus bradycardia with low voltage. Of note, he has been off of Coumadin since February. He states that he had trouble with supratherapeutic INR and ultimately what sounds like a left knee effusion that required surgical drainage. This occurred at a time when he was on antibiotics as well. He has decided to stay off of anticoagulation since then.  He walks a mile 4-5 days a week, no exertional chest pain or unusual shortness of breath.  Past Medical History  Diagnosis Date  . Atrial fibrillation [facility])     Reportedly diagnosed November 2011  . Type 2 diabetes mellitus ([place])   . Essential hypertension, benign   . Prostate cancer [facility])     Radiation implants    Current Outpatient Prescriptions  Medication Sig Dispense Refill  . acetaminophen (TYLENOL) 500 MG tablet Take 500 mg by mouth every 6 (six) hours as needed.    Marland Kitchen amLODipine (NORVASC) 10 MG tablet Take 10 mg by mouth daily.    . ferrous gluconate (FERGON) 324 MG tablet Take 324 mg by mouth daily with breakfast.    . furosemide (LASIX) 20 MG tablet TAKE ONE TABLET BY MOUTH ONCE DAILY 30 tablet 3  . lisinopril (PRINIVIL,ZESTRIL) 20 MG tablet Take 20 mg by mouth daily.    . Loratadine (CLARITIN) 10 MG CAPS Take by mouth.    . loratadine (CLARITIN) 10 MG tablet Take 10 mg by mouth  daily.    . metFORMIN (GLUCOPHAGE) 1000 MG tablet Take 1,000 mg by mouth 2 (two) times daily with a meal.    . metoprolol tartrate (LOPRESSOR) 25 MG tablet TAKE ONE TABLET BY MOUTH TWICE DAILY 60 tablet 3  . omeprazole (PRILOSEC) 20 MG capsule Take 20 mg by mouth daily.    Marland Kitchen oxyCODONE-acetaminophen (PERCOCET/ROXICET) 5-325 MG tablet Take 1 tablet by mouth every 4 (four) hours as needed for severe pain.    Marland Kitchen Potassium Chloride (KLOR-CON 10 PO) Take by mouth.    . pravastatin (PRAVACHOL) 20 MG tablet Take 20 mg by mouth daily.    . sildenafil (REVATIO) 20 MG tablet Take 20 mg by mouth as needed.    . sildenafil (REVATIO) 20 MG tablet Take 20 mg by mouth as needed.    . sulfamethoxazole-trimethoprim (BACTRIM DS) 800-160 MG tablet Take 1 tablet by mouth 2 (two) times daily.    . tamsulosin (FLOMAX) 0.4 MG CAPS capsule Take 0.4 mg by mouth.    . traZODone (DESYREL) 50 MG tablet Take 50 mg by mouth at bedtime as needed.      No current facility-administered medications for this visit.   Allergies:  Other   Social History: The patient  reports that he has never smoked. He has never used smokeless tobacco. He reports that he drinks alcohol. He reports that he does not use illicit drugs.   ROS:  Please see the history of present illness. Otherwise, complete review of systems is positive for improved left knee stiffness  All other systems are reviewed and negative.   Physical Exam: VS:  BP 140/82 mmHg  Pulse 58  Ht 5\' 10"  (1.778 m)  Wt 185 lb (83.915 kg)  BMI 26.54 kg/m2  SpO2 98%, BMI Body mass index is 26.54 kg/(m^2).  Wt Readings from Last 3 Encounters:  02/18/16 185 lb (83.915 kg)  12/25/15 178 lb (80.74 kg)  08/26/15 189 lb 9.6 oz (86.002 kg)   Appears comfortable at rest.  HEENT: Conjunctiva and lids normal, oropharynx clear.  Neck: Supple, no elevated JVP or carotid bruits, no thyromegaly.  Lungs: Clear to auscultation, nonlabored breathing at rest.  Cardiac: RRR, no S3 or  significant systolic murmur, no pericardial rub.  Abdomen: Soft, nontender, bowel sounds present, no guarding or rebound.  Extremities: No pitting edema, distal pulses 2+.   ECG: I personally reviewed the prior tracing from 12/17/2014 which showed sinus bradycardia.  Recent Labwork:  May 2017: BUN 17, creatinine 1.0  Other Studies Reviewed Today:  Echocardiogram August 2015: Mild to moderate LVH with LVEF 60-65%, abnormal diastolic function, no major valvular abnormalities, RVSP 28 mm mercury, no pericardial effusion.   Lexiscan Cardiolite August 2015: Mild basal inferior fixed defect, no active ischemia, LVEF 59%.  Assessment and Plan:  1. History of paroxysmal atrial fibrillation. He is maintaining sinus rhythm at this time and continues on Lopressor. He has stopped anticoagulation as outlined above, previously on Coumadin. Does not want to reinstitute anticoagulation at this point.   2. Preoperative assessment prior to ESWL for treatment of renal stones. No specific cardiac contraindication to proceed. He has been off of Coumadin for some time now.  3. Essential hypertension, he continues on Norvasc, Lopressor, and lisinopril. Keep follow-up with Dr. Scotty Court.  Current medicines were reviewed with the patient today.   Orders Placed This Encounter  Procedures  . EKG 12-Lead    Disposition: FU with me in 6 months.   Signed, Satira Sark, MD, [facility] 02/18/2016 11:14 AM    [facility] [place], [place],  [address] Phone: [phone]; Fax: [phone]

## 2016-02-18 NOTE — Patient Instructions (Signed)
Your physician recommends that you continue on your current medications as directed. Please refer to the Current Medication list given to you today. Your physician recommends that you schedule a follow-up appointment in: 6 months. You will receive a reminder letter in the mail in about 4 months reminding you to call and schedule your appointment. If you don't receive this letter, please contact our office. 

## 2016-02-25 ENCOUNTER — Telehealth: Payer: Self-pay | Admitting: *Deleted

## 2016-02-25 NOTE — Telephone Encounter (Signed)
Margarita Grizzle RN with the  [facility] said she was advised by Dr. Exie Parody to contact our office for f/u plan for patient since his EKG's are now showing a-fib. Patient has been off anticoagulation therapy since February 2017. Laure advised that patient could come to the office for an EKG. Patient will be contacted directly about appointment with nurse for EKG only per Laurie's request.

## 2016-02-26 ENCOUNTER — Ambulatory Visit (INDEPENDENT_AMBULATORY_CARE_PROVIDER_SITE_OTHER): Payer: Medicare Other | Admitting: *Deleted

## 2016-02-26 DIAGNOSIS — I4891 Unspecified atrial fibrillation: Secondary | ICD-10-CM

## 2016-02-26 NOTE — Progress Notes (Signed)
He has known PAF. Heart rate is controlled on metoprolol. As mentioned in recent office note, he stopped Coumadin with concerns about bleeding. If he would like to discuss other anticoagulation options, we can certainly do that. My sense from the recent encounter however is that he did not want to take anticoagulation.

## 2016-02-26 NOTE — Progress Notes (Signed)
Patient came into office today for EKG per recent phone note. Patient denied having any symptoms of being in a-fib. Patient said he feels normal.

## 2016-02-26 NOTE — Telephone Encounter (Signed)
Patient advised and verbalized understanding of plan. Patient will come to the office today around 3:00 pm

## 2016-02-27 ENCOUNTER — Encounter: Payer: Self-pay | Admitting: *Deleted

## 2016-02-27 NOTE — Progress Notes (Signed)
Patient informed and verbalized understanding

## 2016-02-27 NOTE — Telephone Encounter (Signed)
This encounter was created in error - please disregard.

## 2016-03-09 ENCOUNTER — Other Ambulatory Visit: Payer: Self-pay | Admitting: Cardiology

## 2016-03-24 LAB — HEMOGLOBIN A1C: Hemoglobin A1C: 6.8

## 2016-03-26 ENCOUNTER — Telehealth: Payer: Self-pay | Admitting: Cardiology

## 2016-03-26 NOTE — Telephone Encounter (Signed)
Spoke with pt.  He still has some Coumadin 5mg  tablets left at home.  Will have him restart his previous dose of 5mg  daily except 7.5mg  on Tuesday, Thursday and Saturday.  Made appt to recheck INR next Thursday in the [place] office.

## 2016-03-26 NOTE — Telephone Encounter (Signed)
He can resume Coumadin at this time. We can also discuss other anticoagulation options in the future if he would like.

## 2016-03-26 NOTE — Telephone Encounter (Signed)
Per patient, he no longer has blood in his urine and is ready to restart his coumadin.

## 2016-03-26 NOTE — Telephone Encounter (Signed)
Daniel Schaefer is requesting to know if he needs to go back on Coumdin. States that his urine in blood has cleared.

## 2016-03-31 ENCOUNTER — Encounter: Payer: Self-pay | Admitting: "Endocrinology

## 2016-03-31 ENCOUNTER — Ambulatory Visit (INDEPENDENT_AMBULATORY_CARE_PROVIDER_SITE_OTHER): Payer: Medicare Other | Admitting: "Endocrinology

## 2016-03-31 VITALS — BP 142/87 | HR 82 | Ht 70.0 in | Wt 186.0 lb

## 2016-03-31 DIAGNOSIS — E1159 Type 2 diabetes mellitus with other circulatory complications: Secondary | ICD-10-CM

## 2016-03-31 DIAGNOSIS — E785 Hyperlipidemia, unspecified: Secondary | ICD-10-CM

## 2016-03-31 DIAGNOSIS — I10 Essential (primary) hypertension: Secondary | ICD-10-CM

## 2016-03-31 NOTE — Patient Instructions (Signed)

## 2016-03-31 NOTE — Progress Notes (Signed)
Subjective:    Patient ID: Daniel Schaefer, male    DOB: [birth year],    Past Medical History  Diagnosis Date  . Atrial fibrillation [facility])     Reportedly diagnosed November 2011  . Type 2 diabetes mellitus ([place])   . Essential hypertension, benign   . Prostate cancer [facility])     Radiation implants   Past Surgical History  Procedure Laterality Date  . Prostate biopsy    . Gold seed placement     Social History   Social History  . Marital Status: Divorced    Spouse Name: N/A  . Number of Children: N/A  . Years of Education: N/A   Social History Main Topics  . Smoking status: Never Smoker   . Smokeless tobacco: Never Used  . Alcohol Use: 0.0 oz/week    0 Standard drinks or equivalent per week     Comment: Occassional  . Drug Use: No  . Sexual Activity: Not Asked   Other Topics Concern  . None   Social History Narrative   Outpatient Encounter Prescriptions as of 03/31/2016  Medication Sig  . cetirizine (ZYRTEC) 10 MG tablet Take 10 mg by mouth daily.  Marland Kitchen acetaminophen (TYLENOL) 500 MG tablet Take 500 mg by mouth every 6 (six) hours as needed.  Marland Kitchen amLODipine (NORVASC) 10 MG tablet Take 10 mg by mouth daily.  . ferrous gluconate (FERGON) 324 MG tablet Take 324 mg by mouth daily with breakfast. Reported on 03/31/2016  . furosemide (LASIX) 20 MG tablet TAKE ONE TABLET BY MOUTH ONCE DAILY  . lisinopril (PRINIVIL,ZESTRIL) 20 MG tablet Take 20 mg by mouth daily.  . Loratadine (CLARITIN) 10 MG CAPS Take by mouth.  . loratadine (CLARITIN) 10 MG tablet Take 10 mg by mouth daily.  . metFORMIN (GLUCOPHAGE) 1000 MG tablet Take 1,000 mg by mouth 2 (two) times daily with a meal.  . metoprolol tartrate (LOPRESSOR) 25 MG tablet TAKE ONE TABLET BY MOUTH TWICE DAILY  . omeprazole (PRILOSEC) 20 MG capsule Take 20 mg by mouth daily.  Marland Kitchen oxyCODONE-acetaminophen (PERCOCET/ROXICET) 5-325 MG tablet Take 1 tablet by mouth every 4 (four) hours as needed for severe pain. Reported on 03/31/2016  .  Potassium Chloride (KLOR-CON 10 PO) Take by mouth.  . pravastatin (PRAVACHOL) 20 MG tablet Take 20 mg by mouth daily.  . sildenafil (REVATIO) 20 MG tablet Take 20 mg by mouth as needed. Reported on 03/31/2016  . sulfamethoxazole-trimethoprim (BACTRIM DS) 800-160 MG tablet Take 1 tablet by mouth 2 (two) times daily.  . tamsulosin (FLOMAX) 0.4 MG CAPS capsule Take 0.4 mg by mouth.  . traZODone (DESYREL) 50 MG tablet Take 50 mg by mouth at bedtime as needed. Reported on 03/31/2016  . [DISCONTINUED] sildenafil (REVATIO) 20 MG tablet Take 20 mg by mouth as needed.   No facility-administered encounter medications on file as of 03/31/2016.   ALLERGIES: Allergies  Allergen Reactions  . Other     Cannot mix warfarin and bactrim    VACCINATION STATUS:  There is no immunization history on file for this patient.  Diabetes He presents for his follow-up diabetic visit. He has type 2 diabetes mellitus. Onset time: He was diagnosed at approximate age of 46 years. His disease course has been stable (Since his last visit, he developed Coumadin toxicity which resulted in left knee hemarthrosis which required drainage and currently using a walker while doing physical therapy.). There are no hypoglycemic associated symptoms. Pertinent negatives for hypoglycemia include no confusion, headaches,  pallor or seizures. There are no diabetic associated symptoms. Pertinent negatives for diabetes include no chest pain, no fatigue, no polydipsia, no polyphagia, no polyuria and no weakness. There are no hypoglycemic complications. Symptoms are stable. There are no diabetic complications. Risk factors for coronary artery disease include hypertension, dyslipidemia and sedentary lifestyle. Current diabetic treatment includes oral agent (dual therapy). He is compliant with treatment most of the time. His weight is decreasing steadily. His breakfast blood glucose range is generally 140-180 mg/dl. His highest blood glucose is 140-180  mg/dl. An ACE inhibitor/angiotensin II receptor blocker is being taken.  Hypertension This is a chronic problem. The current episode started more than 1 year ago. Pertinent negatives include no chest pain, headaches, neck pain, palpitations or shortness of breath.  Hyperlipidemia This is a chronic problem. The current episode started more than 1 year ago. Pertinent negatives include no chest pain, myalgias or shortness of breath. Current antihyperlipidemic treatment includes statins.     Review of Systems  Constitutional: Negative for fatigue and unexpected weight change.  HENT: Negative for dental problem, mouth sores and trouble swallowing.   Eyes: Negative for visual disturbance.  Respiratory: Negative for cough, choking, chest tightness, shortness of breath and wheezing.   Cardiovascular: Negative for chest pain, palpitations and leg swelling.  Gastrointestinal: Negative for nausea, vomiting, abdominal pain, diarrhea, constipation and abdominal distention.  Endocrine: Negative for polydipsia, polyphagia and polyuria.  Genitourinary: Negative for dysuria, urgency, hematuria and flank pain.  Musculoskeletal: Positive for gait problem. Negative for myalgias, back pain and neck pain.  Skin: Negative for pallor, rash and wound.  Neurological: Negative for seizures, syncope, weakness, numbness and headaches.  Psychiatric/Behavioral: Negative.  Negative for confusion and dysphoric mood.    Objective:    BP 142/87 mmHg  Pulse 82  Ht 5\' 10"  (1.778 m)  Wt 186 lb (84.369 kg)  BMI 26.69 kg/m2  Wt Readings from Last 3 Encounters:  03/31/16 186 lb (84.369 kg)  02/18/16 185 lb (83.915 kg)  12/25/15 178 lb (80.74 kg)    Physical Exam  Constitutional: He is oriented to person, place, and time. He appears well-developed and well-nourished. He is cooperative. No distress.  HENT:  Head: Normocephalic and atraumatic.  Eyes: EOM are normal.  Neck: Normal range of motion. Neck supple. No  tracheal deviation present. No thyromegaly present.  Cardiovascular: Normal rate, S1 normal, S2 normal and normal heart sounds.  Exam reveals no gallop.   No murmur heard. Pulses:      Dorsalis pedis pulses are 1+ on the right side, and 1+ on the left side.       Posterior tibial pulses are 1+ on the right side, and 1+ on the left side.  Pulmonary/Chest: Breath sounds normal. No respiratory distress. He has no wheezes.  Abdominal: Soft. Bowel sounds are normal. He exhibits no distension. There is no tenderness. There is no guarding and no CVA tenderness.  Musculoskeletal: He exhibits no edema.       Right shoulder: He exhibits no swelling and no deformity.  Neurological: He is alert and oriented to person, place, and time. He has normal strength and normal reflexes. No cranial nerve deficit or sensory deficit. Gait normal.  Skin: Skin is warm and dry. No rash noted. No cyanosis. Nails show no clubbing.  Psychiatric: He has a normal mood and affect. His speech is normal and behavior is normal. Judgment and thought content normal. Cognition and memory are normal.    Results for orders placed or  performed in visit on 03/31/16  Hemoglobin A1c  Result Value Ref Range   Hemoglobin A1C 6.8    Complete Blood Count (Most recent): Lab Results  Component Value Date   WBC 6.6 08/31/2013   HGB 14.0 08/31/2013   HCT 40.1 08/31/2013   MCV 75.9* 08/31/2013   PLT 235 08/31/2013   Chemistry (most recent): Lab Results  Component Value Date   NA 140 08/31/2013   K 4.9 08/31/2013   CL 102 08/31/2013   CO2 29 08/31/2013   BUN 15 08/31/2013   CREATININE 1.06 08/31/2013      Assessment & Plan:   1. Type 2 diabetes mellitus without complication, with long-term current use of insulin ([place])  Patient came with Slight worsening of his glucose profile, and  recent  labs did not include A1c, his last A1c was 6.8 %.  Glucose logs and insulin administration records pertaining to this visit,  to be  scanned into patient's records.  Recent labs reviewed. -Patient remains at a high risk for more acute and chronic complications of diabetes which include CAD, CVA, CKD, retinopathy, and neuropathy. These are all discussed in detail with the patient. -I have re-counseled the patient on diet management and weight loss, by adopting a carbohydrate restricted / protein rich  Diet. Patient is advised to stick to a routine mealtimes to eat 3 meals  a day and avoid unnecessary snacks ( to snack only to correct hypoglycemia).  -Suggestion is made for patient to avoid simple carbohydrates   from their diet including Cakes , Desserts, Ice Cream,  Soda (  diet and regular) , Sweet Tea , Candies,  Chips, Cookies, Artificial Sweeteners,   and "Sugar-free" Products .  This will help patient to have stable blood glucose profile and potentially lose weight.  -The patient is given individualized DM education. -I have approached patient to continue on  monitoring of blood glucose before breakfast  -Patient is encouraged to call clinic for blood glucose levels less than 70 or above 300 mg /dl. - I will continue metformin 1 g by mouth twice a day, therapeutically suitable for patient. He is hesitant to add new medications, if he continues to lose control he will be considered for either a basal insulin or DPP IV inhibitor. -He has a concern of cost of medications and would like to avoid additional medications if possible. - He will not need insulin therapy for now.  - Patient is not a suitable candidate for incretin therapy  Nor SGLT2i.  - Patient specific target  for A1c; LDL, HDL, Triglycerides, and  Waist Circumference were discussed in detail.  2) BP/HTN: Controlled. Continue current medications including ACEI. 3) Lipids/HPL:  continue statins. 4)  Weight/Diet:  exercise, and carbohydrates information provided.  5) Chronic Care/Health Maintenance: -Patient on ACEI and Statin medications and encouraged to  continue to follow up with Ophthalmology, Podiatrist at least yearly or according to recommendations, and advised to stay away from smoking. I have recommended yearly flu vaccine and pneumonia vaccination at least every 5 years; moderate intensity exercise for up to 150 minutes weekly; and  sleep for at least 7 hours a day.  Patient to bring meter and  blood glucose logs during their next visit.  Follow up plan: Return in about 3 months (around 07/01/2016) for follow up with pre-visit labs, meter, and logs.  Glade Lloyd, MD Phone: [phone]  Fax: [phone]   03/31/2016, 4:39 PM

## 2016-04-01 ENCOUNTER — Ambulatory Visit (INDEPENDENT_AMBULATORY_CARE_PROVIDER_SITE_OTHER): Payer: Medicare Other | Admitting: *Deleted

## 2016-04-01 DIAGNOSIS — Z5181 Encounter for therapeutic drug level monitoring: Secondary | ICD-10-CM | POA: Diagnosis not present

## 2016-04-01 DIAGNOSIS — I4891 Unspecified atrial fibrillation: Secondary | ICD-10-CM

## 2016-04-01 LAB — POCT INR: INR: 1.8

## 2016-04-08 ENCOUNTER — Ambulatory Visit (INDEPENDENT_AMBULATORY_CARE_PROVIDER_SITE_OTHER): Payer: Medicare Other | Admitting: *Deleted

## 2016-04-08 DIAGNOSIS — Z5181 Encounter for therapeutic drug level monitoring: Secondary | ICD-10-CM

## 2016-04-08 DIAGNOSIS — I4891 Unspecified atrial fibrillation: Secondary | ICD-10-CM | POA: Diagnosis not present

## 2016-04-08 LAB — POCT INR: INR: 2.6

## 2016-04-13 ENCOUNTER — Encounter: Payer: Self-pay | Admitting: "Endocrinology

## 2016-04-20 ENCOUNTER — Other Ambulatory Visit: Payer: Self-pay | Admitting: Cardiology

## 2016-04-22 ENCOUNTER — Ambulatory Visit (INDEPENDENT_AMBULATORY_CARE_PROVIDER_SITE_OTHER): Payer: Medicare Other | Admitting: *Deleted

## 2016-04-22 ENCOUNTER — Other Ambulatory Visit: Payer: Self-pay | Admitting: Cardiology

## 2016-04-22 DIAGNOSIS — I4891 Unspecified atrial fibrillation: Secondary | ICD-10-CM | POA: Diagnosis not present

## 2016-04-22 DIAGNOSIS — Z5181 Encounter for therapeutic drug level monitoring: Secondary | ICD-10-CM

## 2016-04-22 LAB — POCT INR: INR: 3.1

## 2016-05-13 ENCOUNTER — Ambulatory Visit (INDEPENDENT_AMBULATORY_CARE_PROVIDER_SITE_OTHER): Payer: Medicare Other | Admitting: *Deleted

## 2016-05-13 DIAGNOSIS — I4891 Unspecified atrial fibrillation: Secondary | ICD-10-CM | POA: Diagnosis not present

## 2016-05-13 DIAGNOSIS — Z5181 Encounter for therapeutic drug level monitoring: Secondary | ICD-10-CM

## 2016-05-13 LAB — POCT INR: INR: 2.1

## 2016-06-08 ENCOUNTER — Ambulatory Visit (INDEPENDENT_AMBULATORY_CARE_PROVIDER_SITE_OTHER): Payer: Medicare Other | Admitting: *Deleted

## 2016-06-08 DIAGNOSIS — I4891 Unspecified atrial fibrillation: Secondary | ICD-10-CM | POA: Diagnosis not present

## 2016-06-08 DIAGNOSIS — Z5181 Encounter for therapeutic drug level monitoring: Secondary | ICD-10-CM

## 2016-06-08 LAB — POCT INR: INR: 2.5

## 2016-06-16 ENCOUNTER — Other Ambulatory Visit: Payer: Self-pay | Admitting: Cardiology

## 2016-06-28 LAB — LIPID PANEL
Cholesterol: 197 mg/dL (ref 0–200)
HDL: 44 mg/dL (ref 35–70)
LDL CALC: 126 mg/dL
Triglycerides: 136 mg/dL (ref 40–160)

## 2016-06-28 LAB — HEMOGLOBIN A1C: HEMOGLOBIN A1C: 6.9

## 2016-07-05 ENCOUNTER — Ambulatory Visit (INDEPENDENT_AMBULATORY_CARE_PROVIDER_SITE_OTHER): Payer: Medicare Other | Admitting: "Endocrinology

## 2016-07-05 ENCOUNTER — Encounter: Payer: Self-pay | Admitting: "Endocrinology

## 2016-07-05 VITALS — BP 122/77 | HR 83 | Ht 70.0 in | Wt 193.0 lb

## 2016-07-05 DIAGNOSIS — I10 Essential (primary) hypertension: Secondary | ICD-10-CM | POA: Diagnosis not present

## 2016-07-05 DIAGNOSIS — Z794 Long term (current) use of insulin: Secondary | ICD-10-CM

## 2016-07-05 DIAGNOSIS — E782 Mixed hyperlipidemia: Secondary | ICD-10-CM

## 2016-07-05 DIAGNOSIS — E1159 Type 2 diabetes mellitus with other circulatory complications: Secondary | ICD-10-CM | POA: Diagnosis not present

## 2016-07-05 NOTE — Patient Instructions (Signed)

## 2016-07-05 NOTE — Progress Notes (Signed)
Subjective:    Patient ID: Daniel Schaefer, male    DOB: [birth year],    Past Medical History:  Diagnosis Date  . Atrial fibrillation [facility])    Reportedly diagnosed November 2011  . Essential hypertension, benign   . Prostate cancer [facility])    Radiation implants  . Type 2 diabetes mellitus ([place])    Past Surgical History:  Procedure Laterality Date  . Gold seed placement    . PROSTATE BIOPSY     Social History   Social History  . Marital status: Divorced    Spouse name: N/A  . Number of children: N/A  . Years of education: N/A   Social History Main Topics  . Smoking status: Never Smoker  . Smokeless tobacco: Never Used  . Alcohol use 0.0 oz/week     Comment: Occassional  . Drug use: No  . Sexual activity: Not Asked   Other Topics Concern  . None   Social History Narrative  . None   Outpatient Encounter Prescriptions as of 07/05/2016  Medication Sig  . acetaminophen (TYLENOL) 500 MG tablet Take 500 mg by mouth every 6 (six) hours as needed.  Marland Kitchen amLODipine (NORVASC) 10 MG tablet Take 10 mg by mouth daily.  . cetirizine (ZYRTEC) 10 MG tablet Take 10 mg by mouth daily.  . ferrous gluconate (FERGON) 324 MG tablet Take 324 mg by mouth daily with breakfast. Reported on 03/31/2016  . furosemide (LASIX) 20 MG tablet TAKE ONE TABLET BY MOUTH ONCE DAILY  . lisinopril (PRINIVIL,ZESTRIL) 20 MG tablet Take 20 mg by mouth daily.  . Loratadine (CLARITIN) 10 MG CAPS Take by mouth.  . loratadine (CLARITIN) 10 MG tablet Take 10 mg by mouth daily.  . metFORMIN (GLUCOPHAGE) 1000 MG tablet Take 1,000 mg by mouth 2 (two) times daily with a meal.  . metoprolol tartrate (LOPRESSOR) 25 MG tablet TAKE ONE TABLET BY MOUTH TWICE DAILY  . omeprazole (PRILOSEC) 20 MG capsule Take 20 mg by mouth daily.  Marland Kitchen oxyCODONE-acetaminophen (PERCOCET/ROXICET) 5-325 MG tablet Take 1 tablet by mouth every 4 (four) hours as needed for severe pain. Reported on 03/31/2016  . potassium chloride (K-DUR) 10 MEQ  tablet TAKE ONE TABLET BY MOUTH ONCE DAILY  . Potassium Chloride (KLOR-CON 10 PO) Take by mouth.  . pravastatin (PRAVACHOL) 20 MG tablet Take 20 mg by mouth daily.  . sildenafil (REVATIO) 20 MG tablet Take 20 mg by mouth as needed. Reported on 03/31/2016  . sulfamethoxazole-trimethoprim (BACTRIM DS) 800-160 MG tablet Take 1 tablet by mouth 2 (two) times daily.  . tamsulosin (FLOMAX) 0.4 MG CAPS capsule Take 0.4 mg by mouth.  . traZODone (DESYREL) 50 MG tablet Take 50 mg by mouth at bedtime as needed. Reported on 03/31/2016  . warfarin (COUMADIN) 5 MG tablet Take 1 tablet daily except 1 1/2 tablet on Tuesdays, Thursdays and Saturdays   No facility-administered encounter medications on file as of 07/05/2016.    ALLERGIES: Allergies  Allergen Reactions  . Other     Cannot mix warfarin and bactrim    VACCINATION STATUS:  There is no immunization history on file for this patient.  Diabetes  He presents for his follow-up diabetic visit. He has type 2 diabetes mellitus. Onset time: He was diagnosed at approximate age of 66 years. His disease course has been stable (Since his last visit, he developed Coumadin toxicity which resulted in left knee hemarthrosis which required drainage and currently using a walker while doing physical therapy.). There are  no hypoglycemic associated symptoms. Pertinent negatives for hypoglycemia include no confusion, headaches, pallor or seizures. There are no diabetic associated symptoms. Pertinent negatives for diabetes include no chest pain, no fatigue, no polydipsia, no polyphagia, no polyuria and no weakness. There are no hypoglycemic complications. Symptoms are stable. There are no diabetic complications. Risk factors for coronary artery disease include hypertension, dyslipidemia and sedentary lifestyle. Current diabetic treatment includes oral agent (dual therapy). He is compliant with treatment most of the time. His weight is decreasing steadily. His breakfast blood  glucose range is generally 140-180 mg/dl. His highest blood glucose is 140-180 mg/dl. An ACE inhibitor/angiotensin II receptor blocker is being taken.  Hypertension  This is a chronic problem. The current episode started more than 1 year ago. Pertinent negatives include no chest pain, headaches, neck pain, palpitations or shortness of breath.  Hyperlipidemia  This is a chronic problem. The current episode started more than 1 year ago. Pertinent negatives include no chest pain, myalgias or shortness of breath. Current antihyperlipidemic treatment includes statins.     Review of Systems  Constitutional: Negative for fatigue and unexpected weight change.  HENT: Negative for dental problem, mouth sores and trouble swallowing.   Eyes: Negative for visual disturbance.  Respiratory: Negative for cough, choking, chest tightness, shortness of breath and wheezing.   Cardiovascular: Negative for chest pain, palpitations and leg swelling.  Gastrointestinal: Negative for abdominal distention, abdominal pain, constipation, diarrhea, nausea and vomiting.  Endocrine: Negative for polydipsia, polyphagia and polyuria.  Genitourinary: Negative for dysuria, flank pain, hematuria and urgency.  Musculoskeletal: Positive for gait problem. Negative for back pain, myalgias and neck pain.  Skin: Negative for pallor, rash and wound.  Neurological: Negative for seizures, syncope, weakness, numbness and headaches.  Psychiatric/Behavioral: Negative.  Negative for confusion and dysphoric mood.    Objective:    BP 122/77   Pulse 83   Ht 5\' 10"  (1.778 m)   Wt 193 lb (87.5 kg)   BMI 27.69 kg/m   Wt Readings from Last 3 Encounters:  07/05/16 193 lb (87.5 kg)  03/31/16 186 lb (84.4 kg)  02/18/16 185 lb (83.9 kg)    Physical Exam  Constitutional: He is oriented to person, place, and time. He appears well-developed and well-nourished. He is cooperative. No distress.  HENT:  Head: Normocephalic and atraumatic.   Eyes: EOM are normal.  Neck: Normal range of motion. Neck supple. No tracheal deviation present. No thyromegaly present.  Cardiovascular: Normal rate, S1 normal, S2 normal and normal heart sounds.  Exam reveals no gallop.   No murmur heard. Pulses:      Dorsalis pedis pulses are 1+ on the right side, and 1+ on the left side.       Posterior tibial pulses are 1+ on the right side, and 1+ on the left side.  Pulmonary/Chest: Breath sounds normal. No respiratory distress. He has no wheezes.  Abdominal: Soft. Bowel sounds are normal. He exhibits no distension. There is no tenderness. There is no guarding and no CVA tenderness.  Musculoskeletal: He exhibits no edema.       Right shoulder: He exhibits no swelling and no deformity.  Neurological: He is alert and oriented to person, place, and time. He has normal strength and normal reflexes. No cranial nerve deficit or sensory deficit. Gait normal.  Skin: Skin is warm and dry. No rash noted. No cyanosis. Nails show no clubbing.  Psychiatric: He has a normal mood and affect. His speech is normal and behavior is normal.  Judgment and thought content normal. Cognition and memory are normal.    Results for orders placed or performed in visit on 07/05/16  Lipid panel  Result Value Ref Range   Triglycerides 136 40 - 160 mg/dL   Cholesterol 197 0 - 200 mg/dL   HDL 44 35 - 70 mg/dL   LDL Cholesterol 126 mg/dL  Hemoglobin A1c  Result Value Ref Range   Hemoglobin A1C 6.9    Complete Blood Count (Most recent): Lab Results  Component Value Date   WBC 6.6 08/31/2013   HGB 14.0 08/31/2013   HCT 40.1 08/31/2013   MCV 75.9 (L) 08/31/2013   PLT 235 08/31/2013   Chemistry (most recent): Lab Results  Component Value Date   NA 140 08/31/2013   K 4.9 08/31/2013   CL 102 08/31/2013   CO2 29 08/31/2013   BUN 15 08/31/2013   CREATININE 1.06 08/31/2013      Assessment & Plan:   1. Type 2 diabetes mellitus without complication, with long-term  current use of insulin ([place])  Patient came with Slight worsening of his glucose profile, and  recent  labs did not include A1c, his last A1c was 6.9 %.  Glucose logs and insulin administration records pertaining to this visit,  to be scanned into patient's records.  Recent labs reviewed. -Patient remains at a high risk for more acute and chronic complications of diabetes which include CAD, CVA, CKD, retinopathy, and neuropathy. These are all discussed in detail with the patient. -I have re-counseled the patient on diet management and weight loss, by adopting a carbohydrate restricted / protein rich  Diet. Patient is advised to stick to a routine mealtimes to eat 3 meals  a day and avoid unnecessary snacks ( to snack only to correct hypoglycemia).  -Suggestion is made for patient to avoid simple carbohydrates   from their diet including Cakes , Desserts, Ice Cream,  Soda (  diet and regular) , Sweet Tea , Candies,  Chips, Cookies, Artificial Sweeteners,   and "Sugar-free" Products .  This will help patient to have stable blood glucose profile and potentially lose weight.  -The patient is given individualized DM education. -I have approached patient to continue on  monitoring of blood glucose before breakfast  -Patient is encouraged to call clinic for blood glucose levels less than 70 or above 300 mg /dl. - I will continue metformin 1 g by mouth twice a day, therapeutically suitable for patient. He is hesitant to add new medications, if he continues to lose control he will be considered for either a basal insulin or DPP IV inhibitor. -He has a concern of cost of medications and would like to avoid additional medications if possible. - He will not need insulin therapy for now.  - Patient is not a suitable candidate for incretin therapy  Nor SGLT2i.  - Patient specific target  for A1c; LDL, HDL, Triglycerides, and  Waist Circumference were discussed in detail.  2) BP/HTN: Controlled. Continue current  medications including ACEI. 3) Lipids/HPL:  Uncontrolled with LDL of 126, he has not been consistent taking his pravastatin. I advised him to be more consistent and continue  statins. 4)  Weight/Diet:  exercise, and carbohydrates information provided.  5) Chronic Care/Health Maintenance: -Patient on ACEI and Statin medications and encouraged to continue to follow up with Ophthalmology, Podiatrist at least yearly or according to recommendations, and advised to stay away from smoking. I have recommended yearly flu vaccine and pneumonia vaccination at least every  5 years; moderate intensity exercise for up to 150 minutes weekly; and  sleep for at least 7 hours a day.  Patient to bring meter and  blood glucose logs during their next visit.  Follow up plan: Return in about 3 months (around 10/05/2016) for meter, and logs.  Glade Lloyd, MD Phone: [phone]  Fax: [phone]   07/05/2016, 12:08 PM

## 2016-07-20 ENCOUNTER — Ambulatory Visit (INDEPENDENT_AMBULATORY_CARE_PROVIDER_SITE_OTHER): Payer: Medicare Other | Admitting: *Deleted

## 2016-07-20 DIAGNOSIS — I4891 Unspecified atrial fibrillation: Secondary | ICD-10-CM | POA: Diagnosis not present

## 2016-07-20 DIAGNOSIS — Z5181 Encounter for therapeutic drug level monitoring: Secondary | ICD-10-CM | POA: Diagnosis not present

## 2016-07-20 LAB — POCT INR: INR: 2.9

## 2016-08-25 NOTE — Progress Notes (Signed)
Cardiology Office Note  Date: 08/26/2016   ID: JERED VILORIA, DOB [birth year], MRN [id number]  PCP: Deloria Lair, MD  Primary Cardiologist: Rozann Lesches, MD   Chief Complaint  Patient presents with  . Atrial Fibrillation    History of Present Illness: Daniel Schaefer is a 65 y.o. male last seen in June.He presents for a routine follow-up visit. Reports no substantial increase in palpitations. He did undergo lithotripsy and was off Coumadin for a period of time as outlined in the prior note. He has been back on anticoagulation does not report any obvious bleeding problems, no hematuria.  He continues on Coumadin with follow-up in the anticoagulation clinic.  I reviewed his medications. He continues on Lopressor at 25 mg twice daily.  Past Medical History:  Diagnosis Date  . Atrial fibrillation [facility])    Reportedly diagnosed November 2011  . Essential hypertension, benign   . Prostate cancer [facility])    Radiation implants  . Type 2 diabetes mellitus ([place])     Current Outpatient Prescriptions  Medication Sig Dispense Refill  . amLODipine (NORVASC) 5 MG tablet Take 1 tablet by mouth daily.    . cetirizine (ZYRTEC) 10 MG tablet Take 10 mg by mouth daily.    . furosemide (LASIX) 20 MG tablet TAKE ONE TABLET BY MOUTH ONCE DAILY 30 tablet 6  . lisinopril (PRINIVIL,ZESTRIL) 20 MG tablet Take 20 mg by mouth daily.    . metFORMIN (GLUCOPHAGE) 1000 MG tablet Take 1,000 mg by mouth 2 (two) times daily with a meal.    . metoprolol tartrate (LOPRESSOR) 25 MG tablet TAKE ONE TABLET BY MOUTH TWICE DAILY 60 tablet 6  . omeprazole (PRILOSEC) 20 MG capsule Take 20 mg by mouth daily.    . potassium chloride (K-DUR) 10 MEQ tablet TAKE ONE TABLET BY MOUTH ONCE DAILY 90 tablet 3  . pravastatin (PRAVACHOL) 40 MG tablet Take 1 tablet by mouth at bedtime.    . tamsulosin (FLOMAX) 0.4 MG CAPS capsule Take 0.4 mg by mouth.    . warfarin (COUMADIN) 5 MG tablet Take 1 tablet daily except 1 1/2  tablet on Tuesdays, Thursdays and Saturdays 45 tablet 3  . acetaminophen (TYLENOL) 500 MG tablet Take 500 mg by mouth every 6 (six) hours as needed.     No current facility-administered medications for this visit.    Allergies:  Other   Social History: The patient  reports that he has never smoked. He has never used smokeless tobacco. He reports that he drinks alcohol. He reports that he does not use drugs.   ROS:  Please see the history of present illness. Otherwise, complete review of systems is positive for arthritic knee pain.  All other systems are reviewed and negative.   Physical Exam: VS:  BP 125/86   Pulse 98   Ht 5\' 10"  (1.778 m)   Wt 198 lb (89.8 kg)   BMI 28.41 kg/m , BMI Body mass index is 28.41 kg/m.  Wt Readings from Last 3 Encounters:  08/26/16 198 lb (89.8 kg)  07/05/16 193 lb (87.5 kg)  03/31/16 186 lb (84.4 kg)    Appears comfortable at rest.  HEENT: Conjunctiva and lids normal, oropharynx clear.  Neck: Supple, no elevated JVP or carotid bruits, no thyromegaly.  Lungs: Clear to auscultation, nonlabored breathing at rest.  Cardiac: RRR, no S3 or significant systolic murmur, no pericardial rub.  Abdomen: Soft, nontender, bowel sounds present, no guarding or rebound.  Extremities: No pitting edema, distal  pulses 2+.   ECG: I personally reviewed the tracing from 02/26/2016 which showed atrial fibrillation with low voltage.  Recent Labwork:    Component Value Date/Time   CHOL 197 06/28/2016   TRIG 136 06/28/2016   HDL 44 06/28/2016   LDLCALC 126 06/28/2016  October 2017: Hemoglobin A1c 6.9  Other Studies Reviewed Today:  Echocardiogram 04/04/2013: Study Conclusions  - Left ventricle: The cavity size was normal. Wall thickness was increased in a pattern of mild LVH. Systolic function was normal. The estimated ejection fraction was in the range of 50% to 55%. Wall motion was normal; there were no regional wall motion abnormalities. The  study is not technically sufficient to allow evaluation of LV diastolic function, due to underlying atrial fibrillation. - Left atrium: The atrium was mildly dilated. - Pulmonic valve: Mild regurgitation.  Assessment and Plan:  1. Paroxysmal to persistent atrial fibrillation. Continue current management strategy of heart rate control and anticoagulation.  2. Essential hypertension, blood pressure is adequately controlled today. Additional medications include Norvasc and lisinopril.  Current medicines were reviewed with the patient today.  Disposition: Follow-up in 6 months.  Signed, Satira Sark, MD, [facility] 08/26/2016 10:25 AM    [facility] [place], [place], [place] [address] Phone: [phone]; Fax: [phone]

## 2016-08-26 ENCOUNTER — Ambulatory Visit (INDEPENDENT_AMBULATORY_CARE_PROVIDER_SITE_OTHER): Payer: Medicare Other | Admitting: Cardiology

## 2016-08-26 ENCOUNTER — Encounter: Payer: Self-pay | Admitting: Cardiology

## 2016-08-26 ENCOUNTER — Ambulatory Visit (INDEPENDENT_AMBULATORY_CARE_PROVIDER_SITE_OTHER): Payer: Medicare Other | Admitting: *Deleted

## 2016-08-26 VITALS — BP 125/86 | HR 98 | Ht 70.0 in | Wt 198.0 lb

## 2016-08-26 DIAGNOSIS — I10 Essential (primary) hypertension: Secondary | ICD-10-CM

## 2016-08-26 DIAGNOSIS — I4891 Unspecified atrial fibrillation: Secondary | ICD-10-CM

## 2016-08-26 DIAGNOSIS — Z5181 Encounter for therapeutic drug level monitoring: Secondary | ICD-10-CM | POA: Diagnosis not present

## 2016-08-26 DIAGNOSIS — I481 Persistent atrial fibrillation: Secondary | ICD-10-CM

## 2016-08-26 DIAGNOSIS — I4819 Other persistent atrial fibrillation: Secondary | ICD-10-CM

## 2016-08-26 LAB — POCT INR: INR: 3.5

## 2016-08-26 NOTE — Patient Instructions (Signed)

## 2016-09-16 ENCOUNTER — Ambulatory Visit (INDEPENDENT_AMBULATORY_CARE_PROVIDER_SITE_OTHER): Payer: Medicare Other | Admitting: *Deleted

## 2016-09-16 DIAGNOSIS — I4891 Unspecified atrial fibrillation: Secondary | ICD-10-CM

## 2016-09-16 DIAGNOSIS — Z5181 Encounter for therapeutic drug level monitoring: Secondary | ICD-10-CM | POA: Diagnosis not present

## 2016-09-16 LAB — POCT INR: INR: 4

## 2016-09-28 ENCOUNTER — Encounter: Payer: Self-pay | Admitting: "Endocrinology

## 2016-10-05 ENCOUNTER — Ambulatory Visit (INDEPENDENT_AMBULATORY_CARE_PROVIDER_SITE_OTHER): Payer: Medicare Other | Admitting: *Deleted

## 2016-10-05 ENCOUNTER — Ambulatory Visit (INDEPENDENT_AMBULATORY_CARE_PROVIDER_SITE_OTHER): Payer: Medicare Other | Admitting: "Endocrinology

## 2016-10-05 ENCOUNTER — Encounter: Payer: Self-pay | Admitting: "Endocrinology

## 2016-10-05 VITALS — BP 117/73 | HR 73 | Ht 70.0 in | Wt 200.0 lb

## 2016-10-05 DIAGNOSIS — I4891 Unspecified atrial fibrillation: Secondary | ICD-10-CM

## 2016-10-05 DIAGNOSIS — Z794 Long term (current) use of insulin: Secondary | ICD-10-CM

## 2016-10-05 DIAGNOSIS — I10 Essential (primary) hypertension: Secondary | ICD-10-CM | POA: Diagnosis not present

## 2016-10-05 DIAGNOSIS — Z5181 Encounter for therapeutic drug level monitoring: Secondary | ICD-10-CM

## 2016-10-05 DIAGNOSIS — E782 Mixed hyperlipidemia: Secondary | ICD-10-CM | POA: Diagnosis not present

## 2016-10-05 DIAGNOSIS — E1159 Type 2 diabetes mellitus with other circulatory complications: Secondary | ICD-10-CM

## 2016-10-05 LAB — POCT INR: INR: 3

## 2016-10-05 LAB — HEMOGLOBIN A1C: Hemoglobin A1C: 7.5

## 2016-10-05 MED ORDER — INSULIN PEN NEEDLE 31G X 8 MM MISC: 1.0000 | 2 refills | Status: DC

## 2016-10-05 MED ORDER — METFORMIN HCL 1000 MG PO TABS: 1000.0000 mg | ORAL_TABLET | Freq: Two times a day (BID) | ORAL | 1 refills | Status: DC

## 2016-10-05 MED ORDER — LANCETS MISC: 1.0000 | 3 refills | Status: AC

## 2016-10-05 MED ORDER — GLUCOSE BLOOD VI STRP: ORAL_STRIP | 12 refills | Status: DC

## 2016-10-05 MED ORDER — INSULIN DEGLUDEC 100 UNIT/ML ~~LOC~~ SOPN: 20.0000 [IU] | PEN_INJECTOR | Freq: Every day | SUBCUTANEOUS | 2 refills | Status: DC

## 2016-10-05 NOTE — Progress Notes (Signed)
Subjective:    Patient ID: Daniel Schaefer, male    DOB: 1950/12/22,    Past Medical History:  Diagnosis Date  . Atrial fibrillation [facility])    Reportedly diagnosed November 2011  . Essential hypertension, benign   . Prostate cancer [facility])    Radiation implants  . Type 2 diabetes mellitus ([place])    Past Surgical History:  Procedure Laterality Date  . Gold seed placement    . PROSTATE BIOPSY     Social History   Social History  . Marital status: Divorced    Spouse name: N/A  . Number of children: N/A  . Years of education: N/A   Social History Main Topics  . Smoking status: Never Smoker  . Smokeless tobacco: Never Used  . Alcohol use 0.0 oz/week     Comment: Occassional  . Drug use: No  . Sexual activity: Not Asked   Other Topics Concern  . None   Social History Narrative  . None   Outpatient Encounter Prescriptions as of 10/05/2016  Medication Sig  . acetaminophen (TYLENOL) 500 MG tablet Take 500 mg by mouth every 6 (six) hours as needed.  Marland Kitchen amLODipine (NORVASC) 5 MG tablet Take 1 tablet by mouth daily.  . cetirizine (ZYRTEC) 10 MG tablet Take 10 mg by mouth daily.  . furosemide (LASIX) 20 MG tablet TAKE ONE TABLET BY MOUTH ONCE DAILY  . glucose blood test strip Use as instructed  . insulin degludec (TRESIBA FLEXTOUCH) 100 UNIT/ML SOPN FlexTouch Pen Inject 0.2 mLs (20 Units total) into the skin daily at 10 pm.  . Insulin Pen Needle (B-D ULTRAFINE III SHORT PEN) 31G X 8 MM MISC 1 each by Does not apply route as directed.  . Lancets MISC 1 each by Does not apply route as directed.  Marland Kitchen lisinopril (PRINIVIL,ZESTRIL) 20 MG tablet Take 20 mg by mouth daily.  . metFORMIN (GLUCOPHAGE) 1000 MG tablet Take 1 tablet (1,000 mg total) by mouth 2 (two) times daily with a meal.  . metoprolol tartrate (LOPRESSOR) 25 MG tablet TAKE ONE TABLET BY MOUTH TWICE DAILY  . omeprazole (PRILOSEC) 20 MG capsule Take 20 mg by mouth daily.  . potassium chloride (K-DUR) 10 MEQ tablet TAKE  ONE TABLET BY MOUTH ONCE DAILY  . pravastatin (PRAVACHOL) 40 MG tablet Take 1 tablet by mouth at bedtime.  . tamsulosin (FLOMAX) 0.4 MG CAPS capsule Take 0.4 mg by mouth.  . warfarin (COUMADIN) 5 MG tablet Take 1 tablet daily except 1 1/2 tablet on Tuesdays, Thursdays and Saturdays  . [DISCONTINUED] metFORMIN (GLUCOPHAGE) 1000 MG tablet Take 1,000 mg by mouth 2 (two) times daily with a meal.   No facility-administered encounter medications on file as of 10/05/2016.    ALLERGIES: Allergies  Allergen Reactions  . Other     Cannot mix warfarin and bactrim    VACCINATION STATUS:  There is no immunization history on file for this patient.  Diabetes  He presents for his follow-up diabetic visit. He has type 2 diabetes mellitus. Onset time: He was diagnosed at approximate age of 23 years. His disease course has been worsening (Since his last visit, he developed Coumadin toxicity which resulted in left knee hemarthrosis which required drainage and currently using a walker while doing physical therapy.). There are no hypoglycemic associated symptoms. Pertinent negatives for hypoglycemia include no confusion, headaches, pallor or seizures. There are no diabetic associated symptoms. Pertinent negatives for diabetes include no chest pain, no fatigue, no polydipsia, no polyphagia,  no polyuria and no weakness. There are no hypoglycemic complications. Symptoms are worsening. There are no diabetic complications. Risk factors for coronary artery disease include hypertension, dyslipidemia and sedentary lifestyle. Current diabetic treatment includes oral agent (dual therapy). He is compliant with treatment most of the time. His weight is increasing steadily. He is following a generally unhealthy diet. His breakfast blood glucose range is generally 140-180 mg/dl. His highest blood glucose is 140-180 mg/dl. An ACE inhibitor/angiotensin II receptor blocker is being taken.  Hypertension  This is a chronic problem. The  current episode started more than 1 year ago. Pertinent negatives include no chest pain, headaches, neck pain, palpitations or shortness of breath.  Hyperlipidemia  This is a chronic problem. The current episode started more than 1 year ago. Pertinent negatives include no chest pain, myalgias or shortness of breath. Current antihyperlipidemic treatment includes statins.     Review of Systems  Constitutional: Negative for fatigue and unexpected weight change.  HENT: Negative for dental problem, mouth sores and trouble swallowing.   Eyes: Negative for visual disturbance.  Respiratory: Negative for cough, choking, chest tightness, shortness of breath and wheezing.   Cardiovascular: Negative for chest pain, palpitations and leg swelling.  Gastrointestinal: Negative for abdominal distention, abdominal pain, constipation, diarrhea, nausea and vomiting.  Endocrine: Negative for polydipsia, polyphagia and polyuria.  Genitourinary: Negative for dysuria, flank pain, hematuria and urgency.  Musculoskeletal: Positive for gait problem. Negative for back pain, myalgias and neck pain.  Skin: Negative for pallor, rash and wound.  Neurological: Negative for seizures, syncope, weakness, numbness and headaches.  Psychiatric/Behavioral: Negative.  Negative for confusion and dysphoric mood.    Objective:    BP 117/73   Pulse 73   Ht 5\' 10"  (1.778 m)   Wt 200 lb (90.7 kg)   BMI 28.70 kg/m   Wt Readings from Last 3 Encounters:  10/05/16 200 lb (90.7 kg)  08/26/16 198 lb (89.8 kg)  07/05/16 193 lb (87.5 kg)    Physical Exam  Constitutional: He is oriented to person, place, and time. He appears well-developed and well-nourished. He is cooperative. No distress.  HENT:  Head: Normocephalic and atraumatic.  Eyes: EOM are normal.  Neck: Normal range of motion. Neck supple. No tracheal deviation present. No thyromegaly present.  Cardiovascular: Normal rate, S1 normal, S2 normal and normal heart sounds.   Exam reveals no gallop.   No murmur heard. Pulses:      Dorsalis pedis pulses are 1+ on the right side, and 1+ on the left side.       Posterior tibial pulses are 1+ on the right side, and 1+ on the left side.  Pulmonary/Chest: Breath sounds normal. No respiratory distress. He has no wheezes.  Abdominal: Soft. Bowel sounds are normal. He exhibits no distension. There is no tenderness. There is no guarding and no CVA tenderness.  Musculoskeletal: He exhibits no edema.       Right shoulder: He exhibits no swelling and no deformity.  Neurological: He is alert and oriented to person, place, and time. He has normal strength and normal reflexes. No cranial nerve deficit or sensory deficit. Gait normal.  Skin: Skin is warm and dry. No rash noted. No cyanosis. Nails show no clubbing.  Psychiatric: He has a normal mood and affect. His speech is normal and behavior is normal. Judgment and thought content normal. Cognition and memory are normal.    Results for orders placed or performed in visit on 09/16/16  POCT INR  Result  Value Ref Range   INR 4.0    Complete Blood Count (Most recent): Lab Results  Component Value Date   WBC 6.6 08/31/2013   HGB 14.0 08/31/2013   HCT 40.1 08/31/2013   MCV 75.9 (L) 08/31/2013   PLT 235 08/31/2013   Chemistry (most recent): Lab Results  Component Value Date   NA 140 08/31/2013   K 4.9 08/31/2013   CL 102 08/31/2013   CO2 29 08/31/2013   BUN 15 08/31/2013   CREATININE 1.06 08/31/2013      Assessment & Plan:   1. Type 2 diabetes mellitus without complication, with long-term current use of insulin ([place])  Patient came with Slight worsening of his glucose profile, and  recent  labs did not include A1c, his last A1c was 6.9 %.  - His repeat previsit A1c is reported to be 7.5%.  Glucose log records pertaining to this visit,  to be scanned into patient's records- consistent with the stated A1c of 7.5%.  -Patient remains at a high risk for more acute  and chronic complications of diabetes which include CAD, CVA, CKD, retinopathy, and neuropathy. These are all discussed in detail with the patient. -I have re-counseled the patient on diet management and weight loss, by adopting a carbohydrate restricted / protein rich  Diet. Patient is advised to stick to a routine mealtimes to eat 3 meals  a day and avoid unnecessary snacks ( to snack only to correct hypoglycemia).  -Suggestion is made for patient to avoid simple carbohydrates   from their diet including Cakes , Desserts, Ice Cream,  Soda (  diet and regular) , Sweet Tea , Candies,  Chips, Cookies, Artificial Sweeteners,   and "Sugar-free" Products .  This will help patient to have stable blood glucose profile and potentially lose weight.  -The patient is given individualized DM education. - Due to loss of control of diabetes, he would need additional therapy besides metformin. He was given his options and he agrees with my plan to act basal insulin given the duration of diabetes he has close to 20 years. - He has hesitantly accepted initiation of Tresiba 20 units daily at bedtime associated with monitoring of blood glucose before breakfast and daily at bedtime.  -Patient is encouraged to call clinic for blood glucose levels less than 70 or above 300 mg /dl. - I will continue metformin 1 g by mouth twice a day, therapeutically suitable for patient. He is hesitant to add new medications. -He has a concern of cost of medications and would like to avoid additional medications if possible.  - Patient is not a suitable candidate for incretin therapy  Nor SGLT2i.  - Patient specific target  for A1c; LDL, HDL, Triglycerides, and  Waist Circumference were discussed in detail.  2) BP/HTN: Controlled. Continue current medications including ACEI. 3) Lipids/HPL:  Uncontrolled with LDL of 126, he has not been consistent taking his pravastatin. I advised him to be more consistent and continue  statins. 4)   Weight/Diet:  exercise, and carbohydrates information provided.  5) Chronic Care/Health Maintenance: -Patient on ACEI and Statin medications and encouraged to continue to follow up with Ophthalmology, Podiatrist at least yearly or according to recommendations, and advised to stay away from smoking. I have recommended yearly flu vaccine and pneumonia vaccination at least every 5 years; moderate intensity exercise for up to 150 minutes weekly; and  sleep for at least 7 hours a day.  Patient to bring meter and  blood glucose logs  during their next visit.  Follow up plan: Return in about 3 months (around 01/03/2017) for meter, and logs.  Glade Lloyd, MD Phone: [phone]  Fax: [phone]   10/05/2016, 11:43 AM

## 2016-10-05 NOTE — Patient Instructions (Signed)

## 2016-10-06 ENCOUNTER — Other Ambulatory Visit: Payer: Self-pay | Admitting: Cardiology

## 2016-10-26 ENCOUNTER — Other Ambulatory Visit: Payer: Self-pay | Admitting: *Deleted

## 2016-10-26 MED ORDER — WARFARIN SODIUM 5 MG PO TABS: ORAL_TABLET | ORAL | 3 refills | Status: DC

## 2016-11-02 ENCOUNTER — Ambulatory Visit (INDEPENDENT_AMBULATORY_CARE_PROVIDER_SITE_OTHER): Payer: Medicare Other | Admitting: *Deleted

## 2016-11-02 DIAGNOSIS — Z5181 Encounter for therapeutic drug level monitoring: Secondary | ICD-10-CM

## 2016-11-02 DIAGNOSIS — I4891 Unspecified atrial fibrillation: Secondary | ICD-10-CM | POA: Diagnosis not present

## 2016-11-02 LAB — POCT INR: INR: 2.2

## 2016-11-25 ENCOUNTER — Other Ambulatory Visit: Payer: Self-pay | Admitting: Cardiology

## 2016-11-29 ENCOUNTER — Telehealth: Payer: Self-pay | Admitting: *Deleted

## 2016-11-29 ENCOUNTER — Other Ambulatory Visit: Payer: Self-pay | Admitting: "Endocrinology

## 2016-11-29 MED ORDER — INSULIN GLARGINE 100 UNIT/ML SOLOSTAR PEN: 20.0000 [IU] | PEN_INJECTOR | Freq: Every day | SUBCUTANEOUS | 11 refills | Status: DC

## 2016-11-29 NOTE — Telephone Encounter (Signed)
Pt is having weight gain and rashes since he started tresiba

## 2016-11-29 NOTE — Telephone Encounter (Signed)
I will switch his insulin to Lantus , same dose and same time.

## 2016-11-29 NOTE — Telephone Encounter (Signed)
Ask him if his rash is at injection site or generalized.

## 2016-11-29 NOTE — Telephone Encounter (Signed)
Pt states the rash is all over and has had it since he started the [place].

## 2016-11-29 NOTE — Telephone Encounter (Signed)
Patient called stating he has experienced rashes and weight gain since he has been taking the medication we prescribed.

## 2016-11-30 ENCOUNTER — Telehealth: Payer: Self-pay

## 2016-11-30 ENCOUNTER — Ambulatory Visit (INDEPENDENT_AMBULATORY_CARE_PROVIDER_SITE_OTHER): Payer: Medicare Other | Admitting: *Deleted

## 2016-11-30 DIAGNOSIS — I4891 Unspecified atrial fibrillation: Secondary | ICD-10-CM

## 2016-11-30 DIAGNOSIS — Z5181 Encounter for therapeutic drug level monitoring: Secondary | ICD-10-CM

## 2016-11-30 LAB — POCT INR: INR: 2

## 2016-11-30 MED ORDER — INSULIN GLARGINE 300 UNIT/ML ~~LOC~~ SOPN: 20.0000 [IU] | PEN_INJECTOR | Freq: Every day | SUBCUTANEOUS | 2 refills | Status: DC

## 2016-11-30 NOTE — Telephone Encounter (Signed)
This makes no sense , the best option of insulin  will depend on his insurance. We can try Toujeo, go ahead  Replace it with same dose.

## 2016-11-30 NOTE — Telephone Encounter (Signed)
Pt called and states that he cannot take Lantus because "it runs his sugar up". He wanted the Lantus cancelled and another prescription called in.

## 2016-12-01 NOTE — Telephone Encounter (Signed)
Toujeo sent

## 2016-12-03 ENCOUNTER — Telehealth: Payer: Self-pay

## 2016-12-03 NOTE — Telephone Encounter (Signed)
Pt states that he cannot take Lantus or [place]. His insurance will not cover Toujeo. The pt or pharmacy cannot tell me what is covered. He is requesting another prescription sent to [facility].

## 2016-12-06 NOTE — Telephone Encounter (Signed)
Pt has decided to stay on Tresiba

## 2016-12-13 ENCOUNTER — Other Ambulatory Visit: Payer: Self-pay | Admitting: "Endocrinology

## 2016-12-13 ENCOUNTER — Telehealth: Payer: Self-pay

## 2016-12-13 MED ORDER — INSULIN DETEMIR 100 UNIT/ML FLEXPEN: 20.0000 [IU] | PEN_INJECTOR | Freq: Every day | SUBCUTANEOUS | 2 refills | Status: DC

## 2016-12-13 NOTE — Telephone Encounter (Signed)
Pt states that the [name] is causing welps, leg pain, & leg swelling. He has quit taking. He has called his insurance company and states they will pay for Levemir.

## 2016-12-13 NOTE — Telephone Encounter (Signed)
I will send levemir rx to his pharmacy.

## 2016-12-28 ENCOUNTER — Ambulatory Visit (INDEPENDENT_AMBULATORY_CARE_PROVIDER_SITE_OTHER): Payer: Medicare Other | Admitting: *Deleted

## 2016-12-28 DIAGNOSIS — I4891 Unspecified atrial fibrillation: Secondary | ICD-10-CM | POA: Diagnosis not present

## 2016-12-28 DIAGNOSIS — Z5181 Encounter for therapeutic drug level monitoring: Secondary | ICD-10-CM

## 2016-12-28 LAB — POCT INR: INR: 3.9

## 2016-12-30 LAB — HEMOGLOBIN A1C: HEMOGLOBIN A1C: 7.2

## 2016-12-31 ENCOUNTER — Other Ambulatory Visit: Payer: Self-pay | Admitting: Urology

## 2016-12-31 ENCOUNTER — Ambulatory Visit (INDEPENDENT_AMBULATORY_CARE_PROVIDER_SITE_OTHER): Payer: Medicare Other | Admitting: Urology

## 2016-12-31 ENCOUNTER — Ambulatory Visit (HOSPITAL_COMMUNITY)
Admission: RE | Admit: 2016-12-31 | Discharge: 2016-12-31 | Disposition: A | Discharge status: Home / Self Care | Payer: Medicare Other | Source: Ambulatory Visit | Attending: Urology | Admitting: Urology

## 2016-12-31 DIAGNOSIS — N320 Bladder-neck obstruction: Secondary | ICD-10-CM | POA: Diagnosis not present

## 2016-12-31 DIAGNOSIS — N200 Calculus of kidney: Secondary | ICD-10-CM | POA: Diagnosis not present

## 2016-12-31 DIAGNOSIS — N5201 Erectile dysfunction due to arterial insufficiency: Secondary | ICD-10-CM

## 2016-12-31 DIAGNOSIS — Z8546 Personal history of malignant neoplasm of prostate: Secondary | ICD-10-CM | POA: Diagnosis not present

## 2017-01-06 ENCOUNTER — Encounter: Payer: Self-pay | Admitting: "Endocrinology

## 2017-01-06 ENCOUNTER — Ambulatory Visit (INDEPENDENT_AMBULATORY_CARE_PROVIDER_SITE_OTHER): Payer: Medicare Other | Admitting: "Endocrinology

## 2017-01-06 VITALS — BP 133/91 | HR 83 | Ht 70.0 in | Wt 210.0 lb

## 2017-01-06 DIAGNOSIS — Z794 Long term (current) use of insulin: Secondary | ICD-10-CM | POA: Diagnosis not present

## 2017-01-06 DIAGNOSIS — E1159 Type 2 diabetes mellitus with other circulatory complications: Secondary | ICD-10-CM | POA: Diagnosis not present

## 2017-01-06 DIAGNOSIS — E782 Mixed hyperlipidemia: Secondary | ICD-10-CM

## 2017-01-06 DIAGNOSIS — I10 Essential (primary) hypertension: Secondary | ICD-10-CM | POA: Diagnosis not present

## 2017-01-06 NOTE — Progress Notes (Signed)
Subjective:    Patient ID: Daniel Schaefer, male    DOB: 09-11-1951,    Past Medical History:  Diagnosis Date  . Atrial fibrillation [facility])    Reportedly diagnosed November 2011  . Essential hypertension, benign   . Prostate cancer [facility])    Radiation implants  . Type 2 diabetes mellitus ([place])    Past Surgical History:  Procedure Laterality Date  . Gold seed placement    . PROSTATE BIOPSY     Social History   Social History  . Marital status: Divorced    Spouse name: N/A  . Number of children: N/A  . Years of education: N/A   Social History Main Topics  . Smoking status: Never Smoker  . Smokeless tobacco: Never Used  . Alcohol use 0.0 oz/week     Comment: Occassional  . Drug use: No  . Sexual activity: Not Asked   Other Topics Concern  . None   Social History Narrative  . None   Outpatient Encounter Prescriptions as of 01/06/2017  Medication Sig  . triamcinolone cream (KENALOG) 0.1 % Apply 1 application topically 2 (two) times daily.  Marland Kitchen acetaminophen (TYLENOL) 500 MG tablet Take 500 mg by mouth every 6 (six) hours as needed.  Marland Kitchen amLODipine (NORVASC) 5 MG tablet Take 1 tablet by mouth daily.  . cetirizine (ZYRTEC) 10 MG tablet Take 10 mg by mouth daily.  . furosemide (LASIX) 20 MG tablet TAKE ONE TABLET BY MOUTH ONCE DAILY  . glucose blood test strip Use as instructed  . Insulin Pen Needle (B-D ULTRAFINE III SHORT PEN) 31G X 8 MM MISC 1 each by Does not apply route as directed.  . Lancets MISC 1 each by Does not apply route as directed.  Marland Kitchen lisinopril (PRINIVIL,ZESTRIL) 20 MG tablet Take 20 mg by mouth daily.  . metFORMIN (GLUCOPHAGE) 1000 MG tablet Take 1 tablet (1,000 mg total) by mouth 2 (two) times daily with a meal.  . metoprolol tartrate (LOPRESSOR) 25 MG tablet TAKE ONE TABLET BY MOUTH TWICE DAILY  . omeprazole (PRILOSEC) 20 MG capsule Take 20 mg by mouth daily.  . potassium chloride (K-DUR) 10 MEQ tablet TAKE ONE TABLET BY MOUTH ONCE DAILY  .  pravastatin (PRAVACHOL) 40 MG tablet Take 1 tablet by mouth at bedtime.  . tamsulosin (FLOMAX) 0.4 MG CAPS capsule Take 0.4 mg by mouth.  . warfarin (COUMADIN) 5 MG tablet Take 1 tablet daily except 1 1/2 tablet on Tuesdays  . [DISCONTINUED] Insulin Detemir (LEVEMIR FLEXTOUCH) 100 UNIT/ML Pen Inject 20 Units into the skin daily at 10 pm.   No facility-administered encounter medications on file as of 01/06/2017.    ALLERGIES: Allergies  Allergen Reactions  . Other     Cannot mix warfarin and bactrim   . Daniel Schaefer Flextouch [Insulin Degludec] Rash   VACCINATION STATUS:  There is no immunization history on file for this patient.  Diabetes  He presents for his follow-up diabetic visit. He has type 2 diabetes mellitus. Onset time: He was diagnosed at approximate age of 35 years. His disease course has been improving (Since his last visit, he developed Coumadin toxicity which resulted in left knee hemarthrosis which required drainage and currently using a walker while doing physical therapy.). There are no hypoglycemic associated symptoms. Pertinent negatives for hypoglycemia include no confusion, headaches, pallor or seizures. There are no diabetic associated symptoms. Pertinent negatives for diabetes include no chest pain, no fatigue, no polydipsia, no polyphagia, no polyuria and no weakness.  There are no hypoglycemic complications. Symptoms are improving. There are no diabetic complications. Risk factors for coronary artery disease include hypertension, dyslipidemia and sedentary lifestyle. Current diabetic treatment includes oral agent (dual therapy). He is compliant with treatment most of the time. His weight is increasing steadily. He is following a generally unhealthy diet. His breakfast blood glucose range is generally 140-180 mg/dl. His highest blood glucose is 140-180 mg/dl. An ACE inhibitor/angiotensin II receptor blocker is being taken.  Hypertension  This is a chronic problem. The current  episode started more than 1 year ago. Pertinent negatives include no chest pain, headaches, neck pain, palpitations or shortness of breath.  Hyperlipidemia  This is a chronic problem. The current episode started more than 1 year ago. Pertinent negatives include no chest pain, myalgias or shortness of breath. Current antihyperlipidemic treatment includes statins.     Review of Systems  Constitutional: Negative for fatigue and unexpected weight change.  HENT: Negative for dental problem, mouth sores and trouble swallowing.   Eyes: Negative for visual disturbance.  Respiratory: Negative for cough, choking, chest tightness, shortness of breath and wheezing.   Cardiovascular: Negative for chest pain, palpitations and leg swelling.  Gastrointestinal: Negative for abdominal distention, abdominal pain, constipation, diarrhea, nausea and vomiting.  Endocrine: Negative for polydipsia, polyphagia and polyuria.  Genitourinary: Negative for dysuria, flank pain, hematuria and urgency.  Musculoskeletal: Positive for gait problem. Negative for back pain, myalgias and neck pain.  Skin: Negative for pallor, rash and wound.  Neurological: Negative for seizures, syncope, weakness, numbness and headaches.  Psychiatric/Behavioral: Negative.  Negative for confusion and dysphoric mood.    Objective:    BP (!) 133/91   Pulse 83   Ht 5\' 10"  (1.778 m)   Wt 210 lb (95.3 kg)   BMI 30.13 kg/m   Wt Readings from Last 3 Encounters:  01/06/17 210 lb (95.3 kg)  10/05/16 200 lb (90.7 kg)  08/26/16 198 lb (89.8 kg)    Physical Exam  Constitutional: He is oriented to person, place, and time. He appears well-developed and well-nourished. He is cooperative. No distress.  HENT:  Head: Normocephalic and atraumatic.  Eyes: EOM are normal.  Neck: Normal range of motion. Neck supple. No tracheal deviation present. No thyromegaly present.  Cardiovascular: Normal rate, S1 normal, S2 normal and normal heart sounds.  Exam  reveals no gallop.   No murmur heard. Pulses:      Dorsalis pedis pulses are 1+ on the right side, and 1+ on the left side.       Posterior tibial pulses are 1+ on the right side, and 1+ on the left side.  Pulmonary/Chest: Breath sounds normal. No respiratory distress. He has no wheezes.  Abdominal: Soft. Bowel sounds are normal. He exhibits no distension. There is no tenderness. There is no guarding and no CVA tenderness.  Musculoskeletal: He exhibits no edema.       Right shoulder: He exhibits no swelling and no deformity.  Neurological: He is alert and oriented to person, place, and time. He has normal strength and normal reflexes. No cranial nerve deficit or sensory deficit. Gait normal.  Skin: Skin is warm. Rash noted. No cyanosis. Nails show no clubbing.  Scattered nonspecific maculopapular rash on right upper extremity.  Psychiatric: He has a normal mood and affect. His speech is normal and behavior is normal. Judgment and thought content normal. Cognition and memory are normal.    Results for orders placed or performed in visit on 01/06/17  Hemoglobin A1c  Result Value Ref Range   Hemoglobin A1C 7.2   Hemoglobin A1c  Result Value Ref Range   Hemoglobin A1C 7.5    Complete Blood Count (Most recent): Lab Results  Component Value Date   WBC 6.6 08/31/2013   HGB 14.0 08/31/2013   HCT 40.1 08/31/2013   MCV 75.9 (L) 08/31/2013   PLT 235 08/31/2013   Chemistry (most recent): Lab Results  Component Value Date   NA 140 08/31/2013   K 4.9 08/31/2013   CL 102 08/31/2013   CO2 29 08/31/2013   BUN 15 08/31/2013   CREATININE 1.06 08/31/2013      Assessment & Plan:   1. Type 2 diabetes mellitus without complication, with long-term current use of insulin ([place])  Patient came with Improving A1c of 7.2% from 7.5%. He he took Levemir for 10 weeks after his last visit and decided to discontinue  Insulin due to some rash on his skin.   Glucose log records pertaining to this  visit,  to be scanned into patient's records. - Most recent 2 weeks of glycemic profile is above target related to his discontinuation of insulin.  -Patient remains at a high risk for more acute and chronic complications of diabetes which include CAD, CVA, CKD, retinopathy, and neuropathy. These are all discussed in detail with the patient. -I have re-counseled the patient on diet management and weight loss, by adopting a carbohydrate restricted / protein rich  Diet. Patient is advised to stick to a routine mealtimes to eat 3 meals  a day and avoid unnecessary snacks ( to snack only to correct hypoglycemia).  -Suggestion is made for patient to avoid simple carbohydrates   from their diet including Cakes , Desserts, Ice Cream,  Soda (  diet and regular) , Sweet Tea , Candies,  Chips, Cookies, Artificial Sweeteners,   and "Sugar-free" Products .  This will help patient to have stable blood glucose profile and potentially lose weight.  -The patient is given individualized DM education. -  He was clearly benefiting from the addition of basal insulin, however he does not want to continue on insulin because of skin rash which may not be due to insulin however. I advised him to seek dermatology evaluation if rash persists after 2 weeks.  He may need reinitiation of basal insulin given the duration of diabetes he has close to 20 years. - He has been very hesitant to add medications for various reasons including cost. - He agrees to continue monitoring of blood glucose before breakfast and daily at bedtime.  -Patient is encouraged to call clinic for blood glucose levels less than 70 or above 300 mg /dl. - I will continue metformin 1 g by mouth twice a day, therapeutically suitable for patient. He is hesitant to add new medications.  - Patient is not a suitable candidate for incretin therapy  Nor SGLT2i.  - Patient specific target  for A1c; LDL, HDL, Triglycerides, and  Waist Circumference were discussed  in detail.  2) BP/HTN: Controlled. Continue current medications including ACEI. 3) Lipids/HPL:  Uncontrolled with LDL of 126, he has not been consistent taking his pravastatin. I advised him to be more consistent and continue  statins.  4)  Weight/Diet: He continues to gain weight, 12 pounds since December.  exercise, and carbohydrates information provided.  5) Chronic Care/Health Maintenance:  -Patient on ACEI and Statin medications and encouraged to continue to follow up with Ophthalmology, Podiatrist at least yearly or according to recommendations, and advised to  stay away from smoking. I have recommended yearly flu vaccine and pneumonia vaccination at least every 5 years; moderate intensity exercise for up to 150 minutes weekly; and  sleep for at least 7 hours a day.  Patient to bring meter and  blood glucose logs during their next visit.  Follow up plan: Return in about 3 months (around 04/07/2017) for meter, and logs.  Glade Lloyd, MD Phone: [phone]  Fax: [phone]   01/06/2017, 11:03 AM

## 2017-01-18 ENCOUNTER — Ambulatory Visit (INDEPENDENT_AMBULATORY_CARE_PROVIDER_SITE_OTHER): Payer: Medicare Other | Admitting: *Deleted

## 2017-01-18 DIAGNOSIS — I4891 Unspecified atrial fibrillation: Secondary | ICD-10-CM

## 2017-01-18 DIAGNOSIS — Z5181 Encounter for therapeutic drug level monitoring: Secondary | ICD-10-CM

## 2017-01-18 LAB — POCT INR: INR: 2

## 2017-02-15 ENCOUNTER — Ambulatory Visit (INDEPENDENT_AMBULATORY_CARE_PROVIDER_SITE_OTHER): Payer: Medicare Other | Admitting: *Deleted

## 2017-02-15 DIAGNOSIS — Z5181 Encounter for therapeutic drug level monitoring: Secondary | ICD-10-CM | POA: Diagnosis not present

## 2017-02-15 DIAGNOSIS — I4891 Unspecified atrial fibrillation: Secondary | ICD-10-CM

## 2017-02-15 LAB — POCT INR: INR: 3.3

## 2017-03-15 ENCOUNTER — Ambulatory Visit (INDEPENDENT_AMBULATORY_CARE_PROVIDER_SITE_OTHER): Payer: Medicare Other | Admitting: *Deleted

## 2017-03-15 DIAGNOSIS — I4891 Unspecified atrial fibrillation: Secondary | ICD-10-CM

## 2017-03-15 DIAGNOSIS — Z5181 Encounter for therapeutic drug level monitoring: Secondary | ICD-10-CM | POA: Diagnosis not present

## 2017-03-15 LAB — POCT INR: INR: 4

## 2017-03-28 ENCOUNTER — Other Ambulatory Visit: Payer: Self-pay | Admitting: Cardiology

## 2017-03-29 ENCOUNTER — Ambulatory Visit (INDEPENDENT_AMBULATORY_CARE_PROVIDER_SITE_OTHER): Payer: Medicare Other | Admitting: *Deleted

## 2017-03-29 DIAGNOSIS — I4891 Unspecified atrial fibrillation: Secondary | ICD-10-CM | POA: Diagnosis not present

## 2017-03-29 DIAGNOSIS — Z5181 Encounter for therapeutic drug level monitoring: Secondary | ICD-10-CM | POA: Diagnosis not present

## 2017-03-29 LAB — POCT INR: INR: 2.9

## 2017-04-01 ENCOUNTER — Other Ambulatory Visit: Payer: Self-pay | Admitting: "Endocrinology

## 2017-04-04 ENCOUNTER — Encounter: Payer: Self-pay | Admitting: "Endocrinology

## 2017-04-11 ENCOUNTER — Ambulatory Visit (INDEPENDENT_AMBULATORY_CARE_PROVIDER_SITE_OTHER): Payer: Medicare Other | Admitting: "Endocrinology

## 2017-04-11 ENCOUNTER — Encounter: Payer: Self-pay | Admitting: "Endocrinology

## 2017-04-11 VITALS — BP 137/87 | HR 75 | Ht 70.0 in | Wt 209.0 lb

## 2017-04-11 DIAGNOSIS — E1159 Type 2 diabetes mellitus with other circulatory complications: Secondary | ICD-10-CM | POA: Diagnosis not present

## 2017-04-11 DIAGNOSIS — E782 Mixed hyperlipidemia: Secondary | ICD-10-CM | POA: Diagnosis not present

## 2017-04-11 DIAGNOSIS — I10 Essential (primary) hypertension: Secondary | ICD-10-CM | POA: Diagnosis not present

## 2017-04-11 DIAGNOSIS — Z9119 Patient's noncompliance with other medical treatment and regimen: Secondary | ICD-10-CM

## 2017-04-11 DIAGNOSIS — Z794 Long term (current) use of insulin: Secondary | ICD-10-CM

## 2017-04-11 DIAGNOSIS — Z91199 Patient's noncompliance with other medical treatment and regimen due to unspecified reason: Secondary | ICD-10-CM

## 2017-04-11 MED ORDER — INSULIN PEN NEEDLE 31G X 8 MM MISC: 1.0000 | 3 refills | Status: DC

## 2017-04-11 MED ORDER — INSULIN GLARGINE 100 UNIT/ML SOLOSTAR PEN: 10.0000 [IU] | PEN_INJECTOR | Freq: Every day | SUBCUTANEOUS | 2 refills | Status: DC

## 2017-04-11 NOTE — Progress Notes (Signed)
Subjective:    Patient ID: Daniel Schaefer, male    DOB: 10-03-1950,    Past Medical History:  Diagnosis Date  . Atrial fibrillation [facility])    Reportedly diagnosed November 2011  . Essential hypertension, benign   . Prostate cancer [facility])    Radiation implants  . Type 2 diabetes mellitus ([place])    Past Surgical History:  Procedure Laterality Date  . Gold seed placement    . PROSTATE BIOPSY     Social History   Social History  . Marital status: Divorced    Spouse name: N/A  . Number of children: N/A  . Years of education: N/A   Social History Main Topics  . Smoking status: Never Smoker  . Smokeless tobacco: Never Used  . Alcohol use 0.0 oz/week     Comment: Occassional  . Drug use: No  . Sexual activity: Not Asked   Other Topics Concern  . None   Social History Narrative  . None   Outpatient Encounter Prescriptions as of 04/11/2017  Medication Sig  . amLODipine (NORVASC) 5 MG tablet Take 1 tablet by mouth daily.  . cetirizine (ZYRTEC) 10 MG tablet Take 10 mg by mouth daily.  . furosemide (LASIX) 20 MG tablet TAKE 1 TABLET BY MOUTH ONCE DAILY  . glucose blood test strip Use as instructed  . Lancets MISC 1 each by Does not apply route as directed.  Marland Kitchen lisinopril (PRINIVIL,ZESTRIL) 20 MG tablet Take 20 mg by mouth daily.  . metFORMIN (GLUCOPHAGE) 1000 MG tablet TAKE ONE TABLET BY MOUTH TWICE DAILY WITH A MEAL  . metoprolol tartrate (LOPRESSOR) 25 MG tablet TAKE ONE TABLET BY MOUTH TWICE DAILY  . omeprazole (PRILOSEC) 20 MG capsule Take 20 mg by mouth daily.  . potassium chloride (K-DUR) 10 MEQ tablet TAKE ONE TABLET BY MOUTH ONCE DAILY  . pravastatin (PRAVACHOL) 40 MG tablet Take 1 tablet by mouth at bedtime.  . tamsulosin (FLOMAX) 0.4 MG CAPS capsule Take 0.4 mg by mouth.  . warfarin (COUMADIN) 5 MG tablet Take 1 tablet daily except 1 1/2 tablet on Tuesdays  . acetaminophen (TYLENOL) 500 MG tablet Take 500 mg by mouth every 6 (six) hours as needed.  . Insulin  Glargine (LANTUS SOLOSTAR) 100 UNIT/ML Solostar Pen Inject 10 Units into the skin daily at 10 pm.  . Insulin Pen Needle (B-D ULTRAFINE III SHORT PEN) 31G X 8 MM MISC 1 each by Does not apply route as directed. (Patient not taking: Reported on 04/11/2017)  . Insulin Pen Needle (B-D ULTRAFINE III SHORT PEN) 31G X 8 MM MISC 1 each by Does not apply route as directed.  . triamcinolone cream (KENALOG) 0.1 % Apply 1 application topically 2 (two) times daily.   No facility-administered encounter medications on file as of 04/11/2017.    ALLERGIES: Allergies  Allergen Reactions  . Other     Cannot mix warfarin and bactrim   . [name] Flextouch [Insulin Degludec] Rash   VACCINATION STATUS:  There is no immunization history on file for this patient.  Diabetes  He presents for his follow-up diabetic visit. He has type 2 diabetes mellitus. Onset time: He was diagnosed at approximate age of 32 years. His disease course has been worsening (Since his last visit, he developed Coumadin toxicity which resulted in left knee hemarthrosis which required drainage and currently using a walker while doing physical therapy.). There are no hypoglycemic associated symptoms. Pertinent negatives for hypoglycemia include no confusion, headaches, pallor or seizures.  There are no diabetic associated symptoms. Pertinent negatives for diabetes include no chest pain, no fatigue, no polydipsia, no polyphagia, no polyuria and no weakness. There are no hypoglycemic complications. Symptoms are worsening. There are no diabetic complications. Risk factors for coronary artery disease include hypertension, dyslipidemia and sedentary lifestyle. Current diabetic treatment includes oral agent (dual therapy). He is compliant with treatment most of the time. His weight is increasing steadily. He is following a generally unhealthy diet. His home blood glucose trend is increasing steadily. His breakfast blood glucose range is generally 180-200  mg/dl. His bedtime blood glucose range is generally 180-200 mg/dl. His overall blood glucose range is 180-200 mg/dl. An ACE inhibitor/angiotensin II receptor blocker is being taken.  Hypertension  This is a chronic problem. The current episode started more than 1 year ago. Pertinent negatives include no chest pain, headaches, neck pain, palpitations or shortness of breath.  Hyperlipidemia  This is a chronic problem. The current episode started more than 1 year ago. Pertinent negatives include no chest pain, myalgias or shortness of breath. Current antihyperlipidemic treatment includes statins.     Review of Systems  Constitutional: Negative for fatigue and unexpected weight change.  HENT: Negative for dental problem, mouth sores and trouble swallowing.   Eyes: Negative for visual disturbance.  Respiratory: Negative for cough, choking, chest tightness, shortness of breath and wheezing.   Cardiovascular: Negative for chest pain, palpitations and leg swelling.  Gastrointestinal: Negative for abdominal distention, abdominal pain, constipation, diarrhea, nausea and vomiting.  Endocrine: Negative for polydipsia, polyphagia and polyuria.  Genitourinary: Negative for dysuria, flank pain, hematuria and urgency.  Musculoskeletal: Positive for gait problem. Negative for back pain, myalgias and neck pain.  Skin: Negative for pallor, rash and wound.  Neurological: Negative for seizures, syncope, weakness, numbness and headaches.  Psychiatric/Behavioral: Negative.  Negative for confusion and dysphoric mood.    Objective:    BP 137/87 (BP Location: Left Arm, Patient Position: Sitting, Cuff Size: Normal)   Pulse 75   Ht 5\' 10"  (1.778 m)   Wt 209 lb (94.8 kg)   SpO2 98%   BMI 29.99 kg/m   Wt Readings from Last 3 Encounters:  04/11/17 209 lb (94.8 kg)  01/06/17 210 lb (95.3 kg)  10/05/16 200 lb (90.7 kg)    Physical Exam  Constitutional: He is oriented to person, place, and time. He appears  well-developed and well-nourished. He is cooperative. No distress.  HENT:  Head: Normocephalic and atraumatic.  Eyes: EOM are normal.  Neck: Normal range of motion. Neck supple. No tracheal deviation present. No thyromegaly present.  Cardiovascular: Normal rate, S1 normal, S2 normal and normal heart sounds.  Exam reveals no gallop.   No murmur heard. Pulses:      Dorsalis pedis pulses are 1+ on the right side, and 1+ on the left side.       Posterior tibial pulses are 1+ on the right side, and 1+ on the left side.  Pulmonary/Chest: Breath sounds normal. No respiratory distress. He has no wheezes.  Abdominal: Soft. Bowel sounds are normal. He exhibits no distension. There is no tenderness. There is no guarding and no CVA tenderness.  Musculoskeletal: He exhibits no edema.       Right shoulder: He exhibits no swelling and no deformity.  Neurological: He is alert and oriented to person, place, and time. He has normal strength and normal reflexes. No cranial nerve deficit or sensory deficit. Gait normal.  Skin: Skin is warm. No rash noted. No  cyanosis. Nails show no clubbing.  Psychiatric: His speech is normal. Cognition and memory are normal.  Reluctant affect.    Results for orders placed or performed in visit on 03/29/17  POCT INR  Result Value Ref Range   INR 2.9    Complete Blood Count (Most recent): Lab Results  Component Value Date   WBC 6.6 08/31/2013   HGB 14.0 08/31/2013   HCT 40.1 08/31/2013   MCV 75.9 (L) 08/31/2013   PLT 235 08/31/2013   Chemistry (most recent): Lab Results  Component Value Date   NA 140 08/31/2013   K 4.9 08/31/2013   CL 102 08/31/2013   CO2 29 08/31/2013   BUN 15 08/31/2013   CREATININE 1.06 08/31/2013      Assessment & Plan:   1. Type 2 diabetes mellitus without complication, with long-term current use of insulin ([place])  Patient came with Higher A1c of 8.3% from 7.2%, his average blood glucose range is between 180 and 200.   Glucose log  records pertaining to this visit,  to be scanned into patient's records.  -Patient remains at a high risk for more acute and chronic complications of diabetes which include CAD, CVA, CKD, retinopathy, and neuropathy. These are all discussed in detail with the patient. -I have re-counseled the patient on diet management and weight loss, by adopting a carbohydrate restricted / protein rich  Diet. Patient is advised to stick to a routine mealtimes to eat 3 meals  a day and avoid unnecessary snacks ( to snack only to correct hypoglycemia).  -Suggestion is made for patient to avoid simple carbohydrates   from his  diet including Cakes , Desserts, Ice Cream,  Soda (  diet and regular) , Sweet Tea , Candies,  Chips, Cookies, Artificial Sweeteners,   and "Sugar-free" Products .  This will help patient to have stable blood glucose profile and potentially lose weight.  -The patient is given individualized DM education. -  He has observed the fact that his glycemic readings are rising to above target levels and A1c of 8.3%. - He is not willing to reconsider basal insulin. - I will start with Lantus which he prefers at low-dose of 10 units at bedtime associated with monitoring of blood glucose 4 times a day-before meals and at bedtime for one week and he will drop of his logs for reevaluation. Based on his comfort, he may need adjustment of his insulin based on his blood glucose readings. -Patient is encouraged to call clinic for blood glucose levels less than 70 or above 200 mg /dl x 3. - After one week, he is advised to continue monitoring blood glucose 2 times a day-before breakfast and at bedtime. - I will continue metformin 1 g by mouth twice a day, therapeutically suitable for patient.  - He is generally hesitant and reluctant to add medications. - Patient is not a suitable candidate for incretin therapy  Nor SGLT2i.  - Patient specific target  for A1c; LDL, HDL, Triglycerides, and  Waist Circumference  were discussed in detail.  2) BP/HTN: Controlled. Continue current medications including ACEI. 3) Lipids/HPL:  Uncontrolled with LDL of 126, he has not been consistent taking his pravastatin. I advised him to be more consistent and continue  statins.  4)  Weight/Diet: No successful and weight loss,  exercise, and carbohydrates information provided.  5) Chronic Care/Health Maintenance:  -Patient on ACEI and Statin medications and encouraged to continue to follow up with Ophthalmology, Podiatrist at least yearly or  according to recommendations, and advised to stay away from smoking. I have recommended yearly flu vaccine and pneumonia vaccination at least every 5 years; moderate intensity exercise for up to 150 minutes weekly; and  sleep for at least 7 hours a day.   Patient to bring meter and  blood glucose logs during his next visit.  Follow up plan: Return in about 3 months (around 07/12/2017) for follow up with pre-visit labs, meter, and logs.  Glade Lloyd, MD Phone: [phone]  Fax: [phone]   04/11/2017, 11:42 AM

## 2017-04-12 ENCOUNTER — Other Ambulatory Visit: Payer: Self-pay

## 2017-04-12 MED ORDER — INSULIN DETEMIR 100 UNIT/ML FLEXPEN: 10.0000 [IU] | PEN_INJECTOR | Freq: Every day | SUBCUTANEOUS | 2 refills | Status: DC

## 2017-04-13 ENCOUNTER — Other Ambulatory Visit: Payer: Self-pay

## 2017-04-13 MED ORDER — INSULIN DETEMIR 100 UNIT/ML FLEXPEN: 10.0000 [IU] | PEN_INJECTOR | Freq: Every day | SUBCUTANEOUS | 2 refills | Status: DC

## 2017-04-15 ENCOUNTER — Other Ambulatory Visit: Payer: Self-pay | Admitting: Cardiology

## 2017-04-20 ENCOUNTER — Other Ambulatory Visit: Payer: Self-pay | Admitting: Cardiology

## 2017-04-26 ENCOUNTER — Ambulatory Visit (INDEPENDENT_AMBULATORY_CARE_PROVIDER_SITE_OTHER): Payer: Medicare Other | Admitting: *Deleted

## 2017-04-26 DIAGNOSIS — I4891 Unspecified atrial fibrillation: Secondary | ICD-10-CM

## 2017-04-26 DIAGNOSIS — Z5181 Encounter for therapeutic drug level monitoring: Secondary | ICD-10-CM

## 2017-04-26 LAB — POCT INR: INR: 2.6

## 2017-05-04 ENCOUNTER — Other Ambulatory Visit: Payer: Self-pay | Admitting: Cardiology

## 2017-05-24 ENCOUNTER — Ambulatory Visit (INDEPENDENT_AMBULATORY_CARE_PROVIDER_SITE_OTHER): Payer: Medicare Other | Admitting: *Deleted

## 2017-05-24 DIAGNOSIS — Z5181 Encounter for therapeutic drug level monitoring: Secondary | ICD-10-CM

## 2017-05-24 DIAGNOSIS — I4891 Unspecified atrial fibrillation: Secondary | ICD-10-CM

## 2017-05-24 LAB — POCT INR: INR: 2.2

## 2017-06-22 ENCOUNTER — Other Ambulatory Visit: Payer: Self-pay | Admitting: Cardiology

## 2017-06-26 ENCOUNTER — Other Ambulatory Visit: Payer: Self-pay | Admitting: "Endocrinology

## 2017-06-27 DIAGNOSIS — R0602 Shortness of breath: Secondary | ICD-10-CM | POA: Insufficient documentation

## 2017-07-05 ENCOUNTER — Ambulatory Visit (INDEPENDENT_AMBULATORY_CARE_PROVIDER_SITE_OTHER): Payer: Medicare Other | Admitting: *Deleted

## 2017-07-05 DIAGNOSIS — Z5181 Encounter for therapeutic drug level monitoring: Secondary | ICD-10-CM | POA: Diagnosis not present

## 2017-07-05 DIAGNOSIS — I4891 Unspecified atrial fibrillation: Secondary | ICD-10-CM | POA: Diagnosis not present

## 2017-07-05 LAB — POCT INR: INR: 2.2

## 2017-07-19 ENCOUNTER — Ambulatory Visit (INDEPENDENT_AMBULATORY_CARE_PROVIDER_SITE_OTHER): Payer: Medicare Other | Admitting: "Endocrinology

## 2017-07-19 ENCOUNTER — Encounter: Payer: Self-pay | Admitting: "Endocrinology

## 2017-07-19 ENCOUNTER — Other Ambulatory Visit: Payer: Self-pay | Admitting: Cardiology

## 2017-07-19 VITALS — BP 139/84 | HR 74 | Ht 70.0 in | Wt 212.0 lb

## 2017-07-19 DIAGNOSIS — E1159 Type 2 diabetes mellitus with other circulatory complications: Secondary | ICD-10-CM

## 2017-07-19 DIAGNOSIS — Z794 Long term (current) use of insulin: Secondary | ICD-10-CM | POA: Diagnosis not present

## 2017-07-19 DIAGNOSIS — I10 Essential (primary) hypertension: Secondary | ICD-10-CM | POA: Diagnosis not present

## 2017-07-19 DIAGNOSIS — E782 Mixed hyperlipidemia: Secondary | ICD-10-CM

## 2017-07-19 MED ORDER — INSULIN DETEMIR 100 UNIT/ML FLEXPEN: 30.0000 [IU] | PEN_INJECTOR | Freq: Every day | SUBCUTANEOUS | 2 refills | Status: DC

## 2017-07-19 NOTE — Progress Notes (Signed)
Subjective:    Patient ID: Daniel Schaefer, male    DOB: [birth year],    Past Medical History:  Diagnosis Date  . Atrial fibrillation [facility])    Reportedly diagnosed November 2011  . Essential hypertension, benign   . Prostate cancer [facility])    Radiation implants  . Type 2 diabetes mellitus ([place])    Past Surgical History:  Procedure Laterality Date  . Gold seed placement    . PROSTATE BIOPSY     Social History   Socioeconomic History  . Marital status: Divorced    Spouse name: None  . Number of children: None  . Years of education: None  . Highest education level: None  Social Needs  . Financial resource strain: None  . Food insecurity - worry: None  . Food insecurity - inability: None  . Transportation needs - medical: None  . Transportation needs - non-medical: None  Occupational History  . None  Tobacco Use  . Smoking status: Never Smoker  . Smokeless tobacco: Never Used  Substance and Sexual Activity  . Alcohol use: Yes    Alcohol/week: 0.0 oz    Comment: Occassional  . Drug use: No  . Sexual activity: None  Other Topics Concern  . None  Social History Narrative  . None   Outpatient Encounter Medications as of 07/19/2017  Medication Sig  . acetaminophen (TYLENOL) 500 MG tablet Take 500 mg by mouth every 6 (six) hours as needed.  Marland Kitchen amLODipine (NORVASC) 5 MG tablet Take 1 tablet by mouth daily.  . cetirizine (ZYRTEC) 10 MG tablet Take 10 mg by mouth daily.  . furosemide (LASIX) 20 MG tablet TAKE 1 TABLET BY MOUTH ONCE DAILY  . glucose blood test strip Use as instructed  . Insulin Detemir (LEVEMIR FLEXTOUCH) 100 UNIT/ML Pen Inject 30 Units at bedtime into the skin.  . Insulin Pen Needle (B-D ULTRAFINE III SHORT PEN) 31G X 8 MM MISC 1 each by Does not apply route as directed. (Patient not taking: Reported on 04/11/2017)  . Insulin Pen Needle (B-D ULTRAFINE III SHORT PEN) 31G X 8 MM MISC 1 each by Does not apply route as directed.  . Lancets MISC 1 each by  Does not apply route as directed.  Marland Kitchen lisinopril (PRINIVIL,ZESTRIL) 20 MG tablet Take 20 mg by mouth daily.  . metFORMIN (GLUCOPHAGE) 1000 MG tablet TAKE ONE TABLET BY MOUTH TWICE DAILY WITH A MEAL  . metoprolol tartrate (LOPRESSOR) 25 MG tablet TAKE ONE TABLET BY MOUTH TWICE DAILY  . omeprazole (PRILOSEC) 20 MG capsule Take 20 mg by mouth daily.  . potassium chloride (K-DUR) 10 MEQ tablet TAKE ONE TABLET BY MOUTH ONCE DAILY  . pravastatin (PRAVACHOL) 40 MG tablet Take 1 tablet by mouth at bedtime.  . tamsulosin (FLOMAX) 0.4 MG CAPS capsule Take 0.4 mg by mouth.  . triamcinolone cream (KENALOG) 0.1 % Apply 1 application topically 2 (two) times daily.  Marland Kitchen warfarin (COUMADIN) 5 MG tablet TAKE ONE TABLET BY MOUTH ONCE DAILY EXCEPT ONE AND ONE-HALF ON TUESDAY ONLY  . [DISCONTINUED] Insulin Detemir (LEVEMIR FLEXTOUCH) 100 UNIT/ML Pen Inject 10 Units into the skin at bedtime.   No facility-administered encounter medications on file as of 07/19/2017.    ALLERGIES: Allergies  Allergen Reactions  . Other     Cannot mix warfarin and bactrim   . [name] Flextouch [Insulin Degludec] Rash   VACCINATION STATUS:  There is no immunization history on file for this patient.  Diabetes  He presents  for his follow-up diabetic visit. He has type 2 diabetes mellitus. Onset time: He was diagnosed at approximate age of 38 years. His disease course has been worsening (Since his last visit, he developed Coumadin toxicity which resulted in left knee hemarthrosis which required drainage and currently using a walker while doing physical therapy.). There are no hypoglycemic associated symptoms. Pertinent negatives for hypoglycemia include no confusion, headaches, pallor or seizures. There are no diabetic associated symptoms. Pertinent negatives for diabetes include no chest pain, no fatigue, no polydipsia, no polyphagia, no polyuria and no weakness. There are no hypoglycemic complications. Symptoms are worsening. There are  no diabetic complications. Risk factors for coronary artery disease include hypertension, dyslipidemia and sedentary lifestyle. Current diabetic treatment includes oral agent (dual therapy). He is compliant with treatment most of the time. His weight is increasing steadily. He is following a generally unhealthy diet. His home blood glucose trend is increasing steadily. His breakfast blood glucose range is generally 180-200 mg/dl. His bedtime blood glucose range is generally 180-200 mg/dl. His overall blood glucose range is 180-200 mg/dl. An ACE inhibitor/angiotensin II receptor blocker is being taken.  Hypertension  This is a chronic problem. The current episode started more than 1 year ago. Pertinent negatives include no chest pain, headaches, neck pain, palpitations or shortness of breath.  Hyperlipidemia  This is a chronic problem. The current episode started more than 1 year ago. Pertinent negatives include no chest pain, myalgias or shortness of breath. Current antihyperlipidemic treatment includes statins.     Review of Systems  Constitutional: Negative for fatigue and unexpected weight change.  HENT: Negative for dental problem, mouth sores and trouble swallowing.   Eyes: Negative for visual disturbance.  Respiratory: Negative for cough, choking, chest tightness, shortness of breath and wheezing.   Cardiovascular: Negative for chest pain, palpitations and leg swelling.  Gastrointestinal: Negative for abdominal distention, abdominal pain, constipation, diarrhea, nausea and vomiting.  Endocrine: Negative for polydipsia, polyphagia and polyuria.  Genitourinary: Negative for dysuria, flank pain, hematuria and urgency.  Musculoskeletal: Positive for gait problem. Negative for back pain, myalgias and neck pain.  Skin: Negative for pallor, rash and wound.  Neurological: Negative for seizures, syncope, weakness, numbness and headaches.  Psychiatric/Behavioral: Negative.  Negative for confusion and  dysphoric mood.    Objective:    BP 139/84 (BP Location: Right Arm, Patient Position: Sitting, Cuff Size: Normal)   Pulse 74   Ht 5\' 10"  (1.778 m)   Wt 212 lb (96.2 kg)   BMI 30.42 kg/m   Wt Readings from Last 3 Encounters:  07/19/17 212 lb (96.2 kg)  04/11/17 209 lb (94.8 kg)  01/06/17 210 lb (95.3 kg)    Physical Exam  Constitutional: He is oriented to person, place, and time. He appears well-developed and well-nourished. He is cooperative. No distress.  HENT:  Head: Normocephalic and atraumatic.  Eyes: EOM are normal.  Neck: Normal range of motion. Neck supple. No tracheal deviation present. No thyromegaly present.  Cardiovascular: Normal rate, S1 normal, S2 normal and normal heart sounds. Exam reveals no gallop.  No murmur heard. Pulses:      Dorsalis pedis pulses are 1+ on the right side, and 1+ on the left side.       Posterior tibial pulses are 1+ on the right side, and 1+ on the left side.  Pulmonary/Chest: Breath sounds normal. No respiratory distress. He has no wheezes.  Abdominal: Soft. Bowel sounds are normal. He exhibits no distension. There is no tenderness. There  is no guarding and no CVA tenderness.  Musculoskeletal: He exhibits no edema.       Right shoulder: He exhibits no swelling and no deformity.  Neurological: He is alert and oriented to person, place, and time. He has normal strength and normal reflexes. No cranial nerve deficit or sensory deficit. Gait normal.  Skin: Skin is warm. No rash noted. No cyanosis. Nails show no clubbing.  Psychiatric: His speech is normal. Cognition and memory are normal.  Reluctant affect.    Results for orders placed or performed in visit on 07/05/17  POCT INR  Result Value Ref Range   INR 2.2    Complete Blood Count (Most recent): Lab Results  Component Value Date   WBC 6.6 08/31/2013   HGB 14.0 08/31/2013   HCT 40.1 08/31/2013   MCV 75.9 (L) 08/31/2013   PLT 235 08/31/2013   Chemistry (most recent): Lab  Results  Component Value Date   NA 140 08/31/2013   K 4.9 08/31/2013   CL 102 08/31/2013   CO2 29 08/31/2013   BUN 15 08/31/2013   CREATININE 1.06 08/31/2013      Assessment & Plan:   1. Type 2 diabetes mellitus without complication, with long-term current use of insulin ([place])  He  came with better blood glucose profile, A1c improving to 7.9% from 8.3%. He still admits to dietary indiscretion, consumes ice cream regularly.   Glucose log records pertaining to this visit,  to be scanned into patient's records.  -Patient remains at a high risk for more acute and chronic complications of diabetes which include CAD, CVA, CKD, retinopathy, and neuropathy. These are all discussed in detail with the patient. -I have re-counseled the patient on diet management and weight loss, by adopting a carbohydrate restricted / protein rich  Diet. Patient is advised to stick to a routine mealtimes to eat 3 meals  a day and avoid unnecessary snacks ( to snack only to correct hypoglycemia).  -  Suggestion is made for him to avoid simple carbohydrates  from his diet including Cakes, Sweet Desserts / Pastries, Ice Cream, Soda (diet and regular), Sweet Tea, Candies, Chips, Cookies, Store Bought Juices, Alcohol in Excess of  1-2 drinks a day, Artificial Sweeteners, and "Sugar-free" Products. This will help patient to have stable blood glucose profile and potentially avoid unintended weight gain.   -The patient is given individualized DM education. - He is  willing to continue on basal insulin. - He is now more comfortable and tolerating Levemir. I'll proceed to increase Levemir to 30 units units at bedtime associated with monitoring of blood glucose 2 times a day-before breakfast  and at bedtime. -Patient is encouraged to call clinic for blood glucose levels less than 70 or above 200 mg /dl x 3.  - I will continue metformin 1 g by mouth twice a day, therapeutically suitable for patient.  - He is generally  hesitant and reluctant to add medications. - Patient is not a suitable candidate for incretin therapy  Nor SGLT2i.  - Patient specific target  for A1c; LDL, HDL, Triglycerides, and  Waist Circumference were discussed in detail.  2) BP/HTN: Controlled. I advised him to continue current medications including  ACEI. 3) Lipids/HPL:  Uncontrolled with LDL of 126, he has not been consistent taking his pravastatin. I advised him to be more consistent and continue  Statins. He will have fasting lipid panel along with his next labs.   4)  Weight/Diet: No successful and weight loss,  exercise, and carbohydrates information provided.  5) Chronic Care/Health Maintenance:  -Patient on ACEI and Statin medications and encouraged to continue to follow up with Ophthalmology, Podiatrist at least yearly or according to recommendations, and advised to stay away from smoking. I have recommended yearly flu vaccine and pneumonia vaccination at least every 5 years; moderate intensity exercise for up to 150 minutes weekly; and  sleep for at least 7 hours a day.  - Time spent with the patient: 25 min, of which >50% was spent in reviewing his sugar logs , discussing his hypo- and hyper-glycemic episodes, reviewing his current and  previous labs and insulin doses and developing a plan to avoid hypo- and hyper-glycemia.    Follow up plan: Return in about 3 months (around 10/19/2017) for follow up with pre-visit labs, meter, and logs.  Glade Lloyd, MD Phone: [phone]  Fax: [phone]  -  This note was partially dictated with voice recognition software. Similar sounding words can be transcribed inadequately or may not  be corrected upon review.  07/19/2017, 11:15 AM

## 2017-07-19 NOTE — Patient Instructions (Signed)

## 2017-08-02 ENCOUNTER — Other Ambulatory Visit: Payer: Self-pay | Admitting: Cardiology

## 2017-08-02 NOTE — Telephone Encounter (Signed)
Patient called stating that 15 tabs of Lasix will not be enough until he sees Dr. Domenic Polite on 08-11-17.  Patient call #  [phone].

## 2017-08-02 NOTE — Telephone Encounter (Signed)
Left message to return call 

## 2017-08-02 NOTE — Telephone Encounter (Signed)
Informed patient that #15 was sent to pharmacy today.  This should give him enough to get to next OV.  Advised him to request longer refill at his OV on 08/11/2017 with Dr. Domenic Polite.  Patient verbalized understanding.

## 2017-08-10 NOTE — Progress Notes (Signed)
Cardiology Office Note  Date: 08/11/2017   ID: Daniel Schaefer, Daniel Schaefer May 17, 1951, MRN [id number]  PCP: Daniel Lair., MD  Primary Cardiologist: Daniel Lesches, MD   Chief Complaint  Patient presents with  . Atrial Fibrillation    History of Present Illness: Daniel Schaefer is a 66 y.o. male last seen December 2017.  He presents for a routine follow-up visit.  Reports no palpitations or exertional chest pain with stable NYHA class II dyspnea.  He continues to follow in the anticoagulation clinic on Coumadin.  Last INR was 2.2.  He reports no spontaneous bleeding problems.  I reviewed his medications which are outlined below.  He continues on Lopressor for heart rate control.  Also on Lasix which he had to intensify temporarily due to a component of fluid overload.  He was seen by Dr. Scotty Schaefer.  Past Medical History:  Diagnosis Date  . Atrial fibrillation [facility])    Reportedly diagnosed November 2011  . Essential hypertension, benign   . Prostate cancer [facility])    Radiation implants  . Type 2 diabetes mellitus ([place])     Past Surgical History:  Procedure Laterality Date  . Gold seed placement    . PROSTATE BIOPSY      Current Outpatient Medications  Medication Sig Dispense Refill  . amLODipine (NORVASC) 5 MG tablet Take 1 tablet by mouth daily.    . cetirizine (ZYRTEC) 10 MG tablet Take 10 mg by mouth daily.    . furosemide (LASIX) 20 MG tablet TAKE 1 TABLET BY MOUTH ONCE DAILY **NEED  APPOINTMENT  FOR  FURTHER  REFILLS** 15 tablet 0  . glucose blood test strip Use as instructed 100 each 12  . Insulin Detemir (LEVEMIR FLEXTOUCH) 100 UNIT/ML Pen Inject 30 Units at bedtime into the skin. 5 pen 2  . Insulin Pen Needle (B-D ULTRAFINE III SHORT PEN) 31G X 8 MM MISC 1 each by Does not apply route as directed. (Patient not taking: Reported on 04/11/2017) 50 each 2  . Insulin Pen Needle (B-D ULTRAFINE III SHORT PEN) 31G X 8 MM MISC 1 each by Does not apply route as directed. 50  each 3  . Lancets MISC 1 each by Does not apply route as directed. 100 each 3  . lisinopril (PRINIVIL,ZESTRIL) 20 MG tablet Take 20 mg by mouth daily.    . metFORMIN (GLUCOPHAGE) 1000 MG tablet TAKE ONE TABLET BY MOUTH TWICE DAILY WITH A MEAL 180 tablet 0  . metoprolol tartrate (LOPRESSOR) 25 MG tablet TAKE ONE TABLET BY MOUTH TWICE DAILY 60 tablet 6  . omeprazole (PRILOSEC) 20 MG capsule Take 20 mg by mouth daily.    . potassium chloride (K-DUR) 10 MEQ tablet TAKE ONE TABLET BY MOUTH ONCE DAILY 90 tablet 3  . pravastatin (PRAVACHOL) 40 MG tablet Take 1 tablet by mouth at bedtime.    . tamsulosin (FLOMAX) 0.4 MG CAPS capsule Take 0.4 mg by mouth.    . triamcinolone cream (KENALOG) 0.1 % Apply 1 application topically 2 (two) times daily.    Marland Kitchen warfarin (COUMADIN) 5 MG tablet TAKE ONE TABLET BY MOUTH ONCE DAILY EXCEPT ONE AND ONE-HALF ON TUESDAY ONLY 35 tablet 3   No current facility-administered medications for this visit.    Allergies:  Other and Tresiba flextouch [insulin degludec]   Social History: The patient  reports that  has never smoked. he has never used smokeless tobacco. He reports that he does not drink alcohol or use drugs.  ROS:  Please see the history of present illness. Otherwise, complete review of systems is positive for chronic sinus problems, erectile dysfunction.  All other systems are reviewed and negative.   Physical Exam: VS:  BP 140/84 (BP Location: Right Arm)   Pulse 63   Ht 5\' 10"  (1.778 m)   Wt 213 lb (96.6 kg)   SpO2 97%   BMI 30.56 kg/m , BMI Body mass index is 30.56 kg/m.  Wt Readings from Last 3 Encounters:  08/11/17 213 lb (96.6 kg)  07/19/17 212 lb (96.2 kg)  04/11/17 209 lb (94.8 kg)    General: Patient appears comfortable at rest. HEENT: Conjunctiva and lids normal, oropharynx clear. Neck: Supple, no elevated JVP or carotid bruits, no thyromegaly. Lungs: Clear to auscultation, nonlabored breathing at rest. Cardiac: Regular rate and rhythm, no  S3 or significant systolic murmur, no pericardial rub. Abdomen: Soft, nontender, bowel sounds present, no guarding or rebound. Extremities: Mild ankle edema, distal pulses 2+.  ECG: I personally reviewed the tracing from 02/26/2016 which showed rate controlled atrial fibrillation.  Recent Labwork:    Component Value Date/Time   CHOL 197 06/28/2016   TRIG 136 06/28/2016   HDL 44 06/28/2016   LDLCALC 126 06/28/2016  October 2017: Hemoglobin A1c 6.9  Other Studies Reviewed Today:  Echocardiogram 04/04/2013: Study Conclusions  - Left ventricle: The cavity size was normal. Wall thickness was increased in a pattern of mild LVH. Systolic function was normal. The estimated ejection fraction was in the range of 50% to 55%. Wall motion was normal; there were no regional wall motion abnormalities. The study is not technically sufficient to allow evaluation of LV diastolic function, due to underlying atrial fibrillation. - Left atrium: The atrium was mildly dilated. - Pulmonic valve: Mild regurgitation.  Assessment and Plan:  1.  Persistent atrial fibrillation.  He reports no palpitations and shows adequate heart rate control on Lopressor.  Continue Coumadin with follow-up in the anticoagulation clinic.  2.  Essential hypertension, also on Norvasc and lisinopril.  No changes made today.  Current medicines were reviewed with the patient today.   Orders Placed This Encounter  Procedures  . EKG 12-Lead    Disposition: Follow-up in 6 months.  Signed, Daniel Sark, MD, [facility] 08/11/2017 2:07 PM    [place] [facility] [address] [place], [place] [address] Phone: [phone]; Fax: [phone]

## 2017-08-11 ENCOUNTER — Encounter: Payer: Self-pay | Admitting: Cardiology

## 2017-08-11 ENCOUNTER — Ambulatory Visit (INDEPENDENT_AMBULATORY_CARE_PROVIDER_SITE_OTHER): Payer: Medicare Other | Admitting: Cardiology

## 2017-08-11 VITALS — BP 140/84 | HR 63 | Ht 70.0 in | Wt 213.0 lb

## 2017-08-11 DIAGNOSIS — I481 Persistent atrial fibrillation: Secondary | ICD-10-CM

## 2017-08-11 DIAGNOSIS — I4819 Other persistent atrial fibrillation: Secondary | ICD-10-CM

## 2017-08-11 DIAGNOSIS — I10 Essential (primary) hypertension: Secondary | ICD-10-CM | POA: Diagnosis not present

## 2017-08-11 NOTE — Patient Instructions (Signed)
Your physician wants you to follow-up in: 6 months Dr McDowell You will receive a reminder letter in the mail two months in advance. If you don't receive a letter, please call our office to schedule the follow-up appointment.  Your physician recommends that you continue on your current medications as directed. Please refer to the Current Medication list given to you today.    If you need a refill on your cardiac medications before your next appointment, please call your pharmacy.       Thank you for choosing [facility] !         

## 2017-08-16 ENCOUNTER — Ambulatory Visit (INDEPENDENT_AMBULATORY_CARE_PROVIDER_SITE_OTHER): Payer: Medicare Other | Admitting: *Deleted

## 2017-08-16 ENCOUNTER — Other Ambulatory Visit: Payer: Self-pay | Admitting: Cardiology

## 2017-08-16 DIAGNOSIS — Z5181 Encounter for therapeutic drug level monitoring: Secondary | ICD-10-CM

## 2017-08-16 DIAGNOSIS — I4891 Unspecified atrial fibrillation: Secondary | ICD-10-CM | POA: Diagnosis not present

## 2017-08-16 LAB — POCT INR: INR: 2.5

## 2017-08-25 ENCOUNTER — Other Ambulatory Visit: Payer: Self-pay

## 2017-08-25 MED ORDER — INSULIN DETEMIR 100 UNIT/ML FLEXPEN: 30.0000 [IU] | PEN_INJECTOR | Freq: Every day | SUBCUTANEOUS | 2 refills | Status: DC

## 2017-09-13 ENCOUNTER — Other Ambulatory Visit: Payer: Self-pay | Admitting: Cardiology

## 2017-09-22 ENCOUNTER — Other Ambulatory Visit: Payer: Self-pay | Admitting: "Endocrinology

## 2017-09-27 ENCOUNTER — Ambulatory Visit (INDEPENDENT_AMBULATORY_CARE_PROVIDER_SITE_OTHER): Payer: Medicare Other | Admitting: *Deleted

## 2017-09-27 DIAGNOSIS — I4891 Unspecified atrial fibrillation: Secondary | ICD-10-CM | POA: Diagnosis not present

## 2017-09-27 DIAGNOSIS — Z5181 Encounter for therapeutic drug level monitoring: Secondary | ICD-10-CM | POA: Diagnosis not present

## 2017-09-27 LAB — POCT INR: INR: 2.8

## 2017-09-27 NOTE — Patient Instructions (Signed)
Continue coumadin 1 tablet daily except 1/2 tablet on Wednesdays Continue greens Recheck in 6 weeks 

## 2017-10-14 LAB — LIPID PANEL
CHOLESTEROL: 135 (ref 0–200)
HDL: 39 (ref 35–70)
LDL Cholesterol: 56
Triglycerides: 197 — AB (ref 40–160)

## 2017-10-14 LAB — BASIC METABOLIC PANEL
BUN: 14 (ref 4–21)
CREATININE: 1.1 (ref <=1.3)

## 2017-10-14 LAB — HEMOGLOBIN A1C: HEMOGLOBIN A1C: 7.6

## 2017-10-21 ENCOUNTER — Encounter: Payer: Self-pay | Admitting: "Endocrinology

## 2017-10-21 ENCOUNTER — Ambulatory Visit (INDEPENDENT_AMBULATORY_CARE_PROVIDER_SITE_OTHER): Payer: Medicare Other | Admitting: "Endocrinology

## 2017-10-21 VITALS — BP 139/88 | HR 92 | Ht 70.0 in | Wt 210.0 lb

## 2017-10-21 DIAGNOSIS — E1159 Type 2 diabetes mellitus with other circulatory complications: Secondary | ICD-10-CM | POA: Diagnosis not present

## 2017-10-21 DIAGNOSIS — E782 Mixed hyperlipidemia: Secondary | ICD-10-CM | POA: Diagnosis not present

## 2017-10-21 DIAGNOSIS — Z794 Long term (current) use of insulin: Secondary | ICD-10-CM

## 2017-10-21 DIAGNOSIS — I10 Essential (primary) hypertension: Secondary | ICD-10-CM | POA: Diagnosis not present

## 2017-10-21 NOTE — Progress Notes (Signed)
Subjective:    Patient ID: Daniel Schaefer, male    DOB: 1951-04-27,    Past Medical History:  Diagnosis Date  . Atrial fibrillation [facility])    Reportedly diagnosed November 2011  . Essential hypertension, benign   . Prostate cancer [facility])    Radiation implants  . Type 2 diabetes mellitus ([place])    Past Surgical History:  Procedure Laterality Date  . Gold seed placement    . PROSTATE BIOPSY     Social History   Socioeconomic History  . Marital status: Divorced    Spouse name: None  . Number of children: None  . Years of education: None  . Highest education level: None  Social Needs  . Financial resource strain: None  . Food insecurity - worry: None  . Food insecurity - inability: None  . Transportation needs - medical: None  . Transportation needs - non-medical: None  Occupational History  . None  Tobacco Use  . Smoking status: Never Smoker  . Smokeless tobacco: Never Used  Substance and Sexual Activity  . Alcohol use: No    Alcohol/week: 0.0 oz    Frequency: Never  . Drug use: No  . Sexual activity: None  Other Topics Concern  . None  Social History Narrative  . None   Outpatient Encounter Medications as of 10/21/2017  Medication Sig  . amLODipine (NORVASC) 5 MG tablet Take 1 tablet by mouth daily.  . cetirizine (ZYRTEC) 10 MG tablet Take 10 mg by mouth daily.  . furosemide (LASIX) 20 MG tablet TAKE 1 TABLET BY MOUTH ONCE DAILY NEEDS  APPOINTMENT  FOR  FURTHER  REFILLS  . glucose blood test strip Use as instructed  . Insulin Detemir (LEVEMIR FLEXTOUCH [place]) Inject 36 Units into the skin at bedtime.  . Insulin Pen Needle (B-D ULTRAFINE III SHORT PEN) 31G X 8 MM MISC 1 each by Does not apply route as directed.  . Insulin Pen Needle (B-D ULTRAFINE III SHORT PEN) 31G X 8 MM MISC 1 each by Does not apply route as directed.  . Lancets MISC 1 each by Does not apply route as directed.  Marland Kitchen lisinopril (PRINIVIL,ZESTRIL) 20 MG tablet Take 20 mg by mouth daily.  .  metFORMIN (GLUCOPHAGE) 1000 MG tablet TAKE ONE TABLET BY MOUTH TWICE DAILY WITH A MEAL  . metoprolol tartrate (LOPRESSOR) 25 MG tablet TAKE ONE TABLET BY MOUTH TWICE DAILY  . omeprazole (PRILOSEC) 20 MG capsule Take 20 mg by mouth daily.  . potassium chloride (K-DUR) 10 MEQ tablet TAKE ONE TABLET BY MOUTH ONCE DAILY  . pravastatin (PRAVACHOL) 40 MG tablet Take 1 tablet by mouth at bedtime.  . tamsulosin (FLOMAX) 0.4 MG CAPS capsule Take 0.4 mg by mouth.  . triamcinolone cream (KENALOG) 0.1 % Apply 1 application topically 2 (two) times daily.  Marland Kitchen warfarin (COUMADIN) 5 MG tablet Take 1 tablet daily except 1/2 tablet on Wednesdays  . [DISCONTINUED] Insulin Detemir (LEVEMIR FLEXTOUCH) 100 UNIT/ML Pen Inject 30 Units into the skin at bedtime.   No facility-administered encounter medications on file as of 10/21/2017.    ALLERGIES: Allergies  Allergen Reactions  . Other     Cannot mix warfarin and bactrim   . [name] Flextouch [Insulin Degludec] Rash   VACCINATION STATUS:  There is no immunization history on file for this patient.  Diabetes  He presents for his follow-up diabetic visit. He has type 2 diabetes mellitus. Onset time: He was diagnosed at approximate age of 16 years.  His disease course has been stable (Since his last visit, he developed Coumadin toxicity which resulted in left knee hemarthrosis which required drainage and currently using a walker while doing physical therapy.). There are no hypoglycemic associated symptoms. Pertinent negatives for hypoglycemia include no confusion, headaches, pallor or seizures. There are no diabetic associated symptoms. Pertinent negatives for diabetes include no chest pain, no fatigue, no polydipsia, no polyphagia, no polyuria and no weakness. There are no hypoglycemic complications. Symptoms are worsening. There are no diabetic complications. Risk factors for coronary artery disease include hypertension, dyslipidemia and sedentary lifestyle. Current  diabetic treatment includes oral agent (dual therapy). He is compliant with treatment most of the time. His weight is increasing steadily. He is following a generally unhealthy diet. His home blood glucose trend is increasing steadily. His breakfast blood glucose range is generally 180-200 mg/dl. His bedtime blood glucose range is generally 180-200 mg/dl. His overall blood glucose range is 180-200 mg/dl. An ACE inhibitor/angiotensin II receptor blocker is being taken.  Hypertension  This is a chronic problem. The current episode started more than 1 year ago. Pertinent negatives include no chest pain, headaches, neck pain, palpitations or shortness of breath. Risk factors for coronary artery disease include diabetes mellitus, dyslipidemia, male gender, sedentary lifestyle and obesity. Past treatments include ACE inhibitors and calcium channel blockers.  Hyperlipidemia  This is a chronic problem. The current episode started more than 1 year ago. Exacerbating diseases include diabetes. Pertinent negatives include no chest pain, myalgias or shortness of breath. Current antihyperlipidemic treatment includes statins. Risk factors for coronary artery disease include dyslipidemia, diabetes mellitus, hypertension, male sex, a sedentary lifestyle and obesity.    Review of Systems  Constitutional: Negative for fatigue and unexpected weight change.  HENT: Negative for dental problem, mouth sores and trouble swallowing.   Eyes: Negative for visual disturbance.  Respiratory: Negative for cough, choking, chest tightness, shortness of breath and wheezing.   Cardiovascular: Negative for chest pain, palpitations and leg swelling.  Gastrointestinal: Negative for abdominal distention, abdominal pain, constipation, diarrhea, nausea and vomiting.  Endocrine: Negative for polydipsia, polyphagia and polyuria.  Genitourinary: Negative for dysuria, flank pain, hematuria and urgency.  Musculoskeletal: Positive for gait  problem. Negative for back pain, myalgias and neck pain.  Skin: Negative for pallor, rash and wound.  Neurological: Negative for seizures, syncope, weakness, numbness and headaches.  Psychiatric/Behavioral: Negative.  Negative for confusion and dysphoric mood.    Objective:    BP 139/88   Pulse 92   Ht 5\' 10"  (1.778 m)   Wt 210 lb (95.3 kg)   SpO2 98%   BMI 30.13 kg/m   Wt Readings from Last 3 Encounters:  10/21/17 210 lb (95.3 kg)  08/11/17 213 lb (96.6 kg)  07/19/17 212 lb (96.2 kg)    Physical Exam  Constitutional: He is oriented to person, place, and time. He appears well-developed and well-nourished. He is cooperative. No distress.  HENT:  Head: Normocephalic and atraumatic.  Eyes: EOM are normal.  Neck: Normal range of motion. Neck supple. No tracheal deviation present. No thyromegaly present.  Cardiovascular: Normal rate, S1 normal, S2 normal and normal heart sounds. Exam reveals no gallop.  No murmur heard. Pulses:      Dorsalis pedis pulses are 1+ on the right side, and 1+ on the left side.       Posterior tibial pulses are 1+ on the right side, and 1+ on the left side.  Pulmonary/Chest: Breath sounds normal. No respiratory distress. He has  no wheezes.  Abdominal: Soft. Bowel sounds are normal. He exhibits no distension. There is no tenderness. There is no guarding and no CVA tenderness.  Musculoskeletal: He exhibits no edema.       Right shoulder: He exhibits no swelling and no deformity.  Neurological: He is alert and oriented to person, place, and time. He has normal strength and normal reflexes. No cranial nerve deficit or sensory deficit. Gait normal.  Skin: Skin is warm. No rash noted. No cyanosis. Nails show no clubbing.  Psychiatric: His speech is normal. Cognition and memory are normal.  Reluctant affect.    Results for orders placed or performed in visit on 00/93/81  Basic metabolic panel  Result Value Ref Range   BUN 14 4 - 21   Creatinine 1.1 0.6 -  1.3  Lipid panel  Result Value Ref Range   Triglycerides 197 (A) 40 - 160   Cholesterol 135 0 - 200   HDL 39 35 - 70   LDL Cholesterol 56   Hemoglobin A1c  Result Value Ref Range   Hemoglobin A1C 7.6    Complete Blood Count (Most recent): Lab Results  Component Value Date   WBC 6.6 08/31/2013   HGB 14.0 08/31/2013   HCT 40.1 08/31/2013   MCV 75.9 (L) 08/31/2013   PLT 235 08/31/2013   Chemistry (most recent): Lab Results  Component Value Date   NA 140 08/31/2013   K 4.9 08/31/2013   CL 102 08/31/2013   CO2 29 08/31/2013   BUN 14 10/14/2017   CREATININE 1.1 10/14/2017    Assessment & Plan:   1. Type 2 diabetes mellitus without complication, with long-term current use of insulin ([place])  He  came with stable blood glucose profile, A1c at 7.6%   He still admits to dietary indiscretion, consumes ice cream regularly.   Glucose log records pertaining to this visit,  to be scanned into patient's records.  -Patient remains at a high risk for more acute and chronic complications of diabetes which include CAD, CVA, CKD, retinopathy, and neuropathy. These are all discussed in detail with the patient. -I have re-counseled the patient on diet management and weight loss, by adopting a carbohydrate restricted / protein rich  Diet. Patient is advised to stick to a routine mealtimes to eat 3 meals  a day and avoid unnecessary snacks ( to snack only to correct hypoglycemia).  -  Suggestion is made for him to avoid simple carbohydrates  from his diet including Cakes, Sweet Desserts / Pastries, Ice Cream, Soda (diet and regular), Sweet Tea, Candies, Chips, Cookies, Store Bought Juices, Alcohol in Excess of  1-2 drinks a day, Artificial Sweeteners, and "Sugar-free" Products. This will help patient to have stable blood glucose profile and potentially avoid unintended weight gain. -He will be sent for diabetes education with Bolivar Haw, CDE.  - He is  willing to continue on basal insulin. -  He is now more comfortable and tolerating Levemir. I'll proceed to increase Levemir to 36 units units at bedtime associated with monitoring of blood glucose 2 times a day-before breakfast  and at bedtime. -Patient is encouraged to call clinic for blood glucose levels less than 70 or above 200 mg /dl x 3.  - I will continue metformin 1000 mg by mouth twice a day, therapeutically suitable for patient.  - He is generally hesitant and reluctant to add medications. - Patient is not a suitable candidate for incretin therapy  Nor SGLT2i.  - Patient specific  target  for A1c; LDL, HDL, Triglycerides, and  Waist Circumference were discussed in detail.  2) BP/HTN: His blood pressure is controlled to target.  I advised him to continue current medications including  ACEI. 3) Lipids/HPL:  Uncontrolled with LDL of 126, he has not been consistent taking his pravastatin. I advised him to be more consistent and continue  Statins. He will have fasting lipid panel along with his next labs.   4)  Weight/Diet: He will be sent for diabetes education.  No successful and weight loss,  exercise, and carbohydrates information provided.  5) Chronic Care/Health Maintenance:  -Patient on ACEI and Statin medications and encouraged to continue to follow up with Ophthalmology, Podiatrist at least yearly or according to recommendations, and advised to stay away from smoking. I have recommended yearly flu vaccine and pneumonia vaccination at least every 5 years; moderate intensity exercise for up to 150 minutes weekly; and  sleep for at least 7 hours a day.  - Time spent with the patient: 25 min, of which >50% was spent in reviewing his blood glucose logs , discussing his hypo- and hyper-glycemic episodes, reviewing his current and  previous labs and insulin doses and developing a plan to avoid hypo- and hyper-glycemia. Please refer to Patient Instructions for Blood Glucose Monitoring and Insulin/Medications Dosing Guide"  in media  tab for additional information.  Follow up plan: Return in about 3 months (around 01/18/2018) for follow up with pre-visit labs, meter, and logs.  Glade Lloyd, MD Phone: [phone]  Fax: [phone]  -  This note was partially dictated with voice recognition software. Similar sounding words can be transcribed inadequately or may not  be corrected upon review.  10/21/2017, 1:43 PM

## 2017-11-11 ENCOUNTER — Ambulatory Visit (INDEPENDENT_AMBULATORY_CARE_PROVIDER_SITE_OTHER): Payer: Medicare Other | Admitting: *Deleted

## 2017-11-11 DIAGNOSIS — I4891 Unspecified atrial fibrillation: Secondary | ICD-10-CM

## 2017-11-11 DIAGNOSIS — Z5181 Encounter for therapeutic drug level monitoring: Secondary | ICD-10-CM

## 2017-11-11 LAB — POCT INR: INR: 2

## 2017-11-11 NOTE — Patient Instructions (Signed)
Continue coumadin 1 tablet daily except 1/2 tablet on Wednesdays Continue greens Recheck in 6 weeks 

## 2017-11-24 ENCOUNTER — Other Ambulatory Visit: Payer: Self-pay

## 2017-12-05 ENCOUNTER — Encounter: Payer: Medicare Other | Attending: "Endocrinology | Admitting: Nutrition

## 2017-12-05 VITALS — Ht 70.0 in | Wt 211.0 lb

## 2017-12-05 DIAGNOSIS — Z794 Long term (current) use of insulin: Secondary | ICD-10-CM | POA: Diagnosis not present

## 2017-12-05 DIAGNOSIS — E1159 Type 2 diabetes mellitus with other circulatory complications: Secondary | ICD-10-CM | POA: Diagnosis not present

## 2017-12-05 DIAGNOSIS — E118 Type 2 diabetes mellitus with unspecified complications: Secondary | ICD-10-CM

## 2017-12-05 DIAGNOSIS — Z713 Dietary counseling and surveillance: Secondary | ICD-10-CM | POA: Diagnosis not present

## 2017-12-05 DIAGNOSIS — IMO0002 Reserved for concepts with insufficient information to code with codable children: Secondary | ICD-10-CM

## 2017-12-05 DIAGNOSIS — E1165 Type 2 diabetes mellitus with hyperglycemia: Secondary | ICD-10-CM

## 2017-12-05 NOTE — Patient Instructions (Addendum)
Goals 1. Follow My Plate- 2-3 carb choices per meal 2. Increase lower carb vegetables 3. Drink only water 4. Watch portions Cut out sodas

## 2017-12-05 NOTE — Progress Notes (Signed)
  Medical Nutrition Therapy:  Appt start time: 1100 end time:  1230.   Assessment:  Primary concerns today: Diabetes Type 2. Lives by himself. Eats 2-3 meals per day. .Levemir 36 units. And Metformin 1000 Mg BID.  CHF: 2 years ag0.  FBS:  170's.  Bedtime 130-180's. He notes he eats larger portions. Physical activity. ADL.  He notes his A1C use to be in the 6's and spiked up this past year or so. TG elevated. He admits to eating some sweets and snacks at times. He drinks Diet sodas mostly and not a lot of water. Engaged in making better choices. Eats out often-mostly sit down restaurants. Diet is high in fat and processed foods and low in fresh fruits and vegetables and high fiber foods contributing to elevated blood sugars, and elevated TG.   Lab Results  Component Value Date   HGBA1C 7.6 10/14/2017   Lab Results  Component Value Date   CHOL 135 10/14/2017   CHOL 197 06/28/2016   Lab Results  Component Value Date   HDL 39 10/14/2017   HDL 44 06/28/2016   Lab Results  Component Value Date   LDLCALC 56 10/14/2017   LDLCALC 126 06/28/2016   Lab Results  Component Value Date   TRIG 197 (A) 10/14/2017   TRIG 136 06/28/2016   Preferred Learning Style:  No preference indicated   Learning Readiness:  Ready  Change in progress   MEDICATIONS:See list   DIETARY INTAKE:  24-hr recall:  B ( AM): Jimmy Dean sausage bowl, or eggs and oatmeal,   Snk ( AM):  L ( PM): 2 hot dogs , onion rings or french fries, Diet sodas Snk ( PM):  D ( PM): Marie callandsd, roasted [place], potaotes, green beans, Dt Soda Snk ( PM): apple  Beverages: Diet soda, water.  Usual physical activity: ADL   Estimated energy needs: 1800  calories 200  g carbohydrates 135 g protein 50 g fat  Progress Towards Goal(s):  In progress.   Nutritional Diagnosis:  NB-1.1 Food and nutrition-related knowledge deficit As related to Diabets.  As evidenced by A1C 7.6.    Intervention:  Nutrition and  Diabetes education provided on My Plate, CHO counting, meal planning, portion sizes, timing of meals, avoiding snacks between meals unless having a low blood sugar, target ranges for A1C and blood sugars, signs/symptoms and treatment of hyper/hypoglycemia, monitoring blood sugars, taking medications as prescribed, benefits of exercising 30 minutes per day and prevention of complications of DM. Marland KitchenGoals 1. Follow My Plate- 2-3 carb choices per meal 2. Increase lower carb vegetables 3. Drink only water 4. Watch portions Cut out sodas   Teaching Method Utilized:  Visual Auditory Hands on  Handouts given during visit include:  The Plate Method    Meal Plan Card   Barriers to learning/adherence to lifestyle change:  none  Demonstrated degree of understanding via:  Teach Back   Monitoring/Evaluation:  Dietary intake, exercise, meal planning, and body weight in 3 month(s).

## 2017-12-06 ENCOUNTER — Encounter: Payer: Self-pay | Admitting: Nutrition

## 2017-12-16 ENCOUNTER — Other Ambulatory Visit: Payer: Self-pay | Admitting: "Endocrinology

## 2017-12-20 ENCOUNTER — Ambulatory Visit (INDEPENDENT_AMBULATORY_CARE_PROVIDER_SITE_OTHER): Payer: Medicare Other | Admitting: *Deleted

## 2017-12-20 DIAGNOSIS — I4891 Unspecified atrial fibrillation: Secondary | ICD-10-CM

## 2017-12-20 DIAGNOSIS — Z5181 Encounter for therapeutic drug level monitoring: Secondary | ICD-10-CM | POA: Diagnosis not present

## 2017-12-20 LAB — POCT INR: INR: 2.3

## 2017-12-20 NOTE — Patient Instructions (Signed)
Continue coumadin 1 tablet daily except 1/2 tablet on Wednesdays Continue greens Recheck in 6 weeks 

## 2017-12-21 ENCOUNTER — Other Ambulatory Visit: Payer: Self-pay | Admitting: "Endocrinology

## 2017-12-29 ENCOUNTER — Other Ambulatory Visit: Payer: Self-pay | Admitting: Urology

## 2017-12-29 ENCOUNTER — Ambulatory Visit (HOSPITAL_COMMUNITY)
Admission: RE | Admit: 2017-12-29 | Discharge: 2017-12-29 | Disposition: A | Discharge status: Home / Self Care | Payer: Medicare Other | Source: Ambulatory Visit | Attending: Urology | Admitting: Urology

## 2017-12-29 DIAGNOSIS — N200 Calculus of kidney: Secondary | ICD-10-CM

## 2018-01-06 ENCOUNTER — Ambulatory Visit (INDEPENDENT_AMBULATORY_CARE_PROVIDER_SITE_OTHER): Payer: Medicare Other | Admitting: Urology

## 2018-01-06 DIAGNOSIS — N200 Calculus of kidney: Secondary | ICD-10-CM

## 2018-01-06 DIAGNOSIS — C61 Malignant neoplasm of prostate: Secondary | ICD-10-CM

## 2018-01-12 LAB — BASIC METABOLIC PANEL
BUN: 16 (ref 4–21)
CREATININE: 1.3 (ref <=1.3)

## 2018-01-12 LAB — HEMOGLOBIN A1C

## 2018-01-20 ENCOUNTER — Encounter: Payer: Self-pay | Admitting: "Endocrinology

## 2018-01-20 ENCOUNTER — Ambulatory Visit (INDEPENDENT_AMBULATORY_CARE_PROVIDER_SITE_OTHER): Payer: Medicare Other | Admitting: "Endocrinology

## 2018-01-20 VITALS — BP 125/78 | HR 54 | Ht 70.0 in | Wt 211.0 lb

## 2018-01-20 DIAGNOSIS — Z794 Long term (current) use of insulin: Secondary | ICD-10-CM | POA: Diagnosis not present

## 2018-01-20 DIAGNOSIS — I10 Essential (primary) hypertension: Secondary | ICD-10-CM

## 2018-01-20 DIAGNOSIS — E1159 Type 2 diabetes mellitus with other circulatory complications: Secondary | ICD-10-CM | POA: Diagnosis not present

## 2018-01-20 DIAGNOSIS — E782 Mixed hyperlipidemia: Secondary | ICD-10-CM | POA: Diagnosis not present

## 2018-01-20 NOTE — Patient Instructions (Signed)

## 2018-01-20 NOTE — Progress Notes (Signed)
Subjective:    Patient ID: Daniel Schaefer, male    DOB: 1951-04-22,    Past Medical History:  Diagnosis Date  . Atrial fibrillation [facility])    Reportedly diagnosed November 2011  . Essential hypertension, benign   . Prostate cancer [facility])    Radiation implants  . Type 2 diabetes mellitus ([place])    Past Surgical History:  Procedure Laterality Date  . Gold seed placement    . PROSTATE BIOPSY     Social History   Socioeconomic History  . Marital status: Divorced    Spouse name: Not on file  . Number of children: Not on file  . Years of education: Not on file  . Highest education level: Not on file  Occupational History  . Not on file  Social Needs  . Financial resource strain: Not on file  . Food insecurity:    Worry: Not on file    Inability: Not on file  . Transportation needs:    Medical: Not on file    Non-medical: Not on file  Tobacco Use  . Smoking status: Never Smoker  . Smokeless tobacco: Never Used  Substance and Sexual Activity  . Alcohol use: No    Alcohol/week: 0.0 oz    Frequency: Never  . Drug use: No  . Sexual activity: Not on file  Lifestyle  . Physical activity:    Days per week: Not on file    Minutes per session: Not on file  . Stress: Not on file  Relationships  . Social connections:    Talks on phone: Not on file    Gets together: Not on file    Attends religious service: Not on file    Active member of club or organization: Not on file    Attends meetings of clubs or organizations: Not on file    Relationship status: Not on file  Other Topics Concern  . Not on file  Social History Narrative  . Not on file   Outpatient Encounter Medications as of 01/20/2018  Medication Sig  . amLODipine (NORVASC) 5 MG tablet Take 1 tablet by mouth daily.  . cetirizine (ZYRTEC) 10 MG tablet Take 10 mg by mouth daily.  . furosemide (LASIX) 20 MG tablet TAKE 1 TABLET BY MOUTH ONCE DAILY NEEDS  APPOINTMENT  FOR  FURTHER  REFILLS  . GLOBAL EASE  INJECT PEN NEEDLES 31G X 8 MM MISC USE 1 DAILY AS DIRECTED - RUN ON CASH $15 - PER PC.  Marland Kitchen glucose blood test strip Use as instructed  . Insulin Detemir (LEVEMIR FLEXTOUCH [place]) Inject 42 Units into the skin at bedtime.  . Insulin Pen Needle (B-D ULTRAFINE III SHORT PEN) 31G X 8 MM MISC 1 each by Does not apply route as directed.  . Lancets MISC 1 each by Does not apply route as directed.  Marland Kitchen lisinopril (PRINIVIL,ZESTRIL) 20 MG tablet Take 20 mg by mouth daily.  . metFORMIN (GLUCOPHAGE) 1000 MG tablet TAKE ONE TABLET BY MOUTH TWICE DAILY WITH A MEAL  . metoprolol tartrate (LOPRESSOR) 25 MG tablet TAKE ONE TABLET BY MOUTH TWICE DAILY  . omeprazole (PRILOSEC) 20 MG capsule Take 20 mg by mouth daily.  . potassium chloride (K-DUR) 10 MEQ tablet TAKE ONE TABLET BY MOUTH ONCE DAILY  . pravastatin (PRAVACHOL) 40 MG tablet Take 1 tablet by mouth at bedtime.  . tamsulosin (FLOMAX) 0.4 MG CAPS capsule Take 0.4 mg by mouth.  . triamcinolone cream (KENALOG) 0.1 % Apply 1  application topically 2 (two) times daily.  Marland Kitchen warfarin (COUMADIN) 5 MG tablet Take 1 tablet daily except 1/2 tablet on Wednesdays  . [DISCONTINUED] Insulin Pen Needle (B-D ULTRAFINE III SHORT PEN) 31G X 8 MM MISC 1 each by Does not apply route as directed.   No facility-administered encounter medications on file as of 01/20/2018.    ALLERGIES: Allergies  Allergen Reactions  . Bactrim [Sulfamethoxazole-Trimethoprim]   . Other     Cannot mix warfarin and bactrim   . [name] Flextouch [Insulin Degludec] Rash   VACCINATION STATUS:  There is no immunization history on file for this patient.  Diabetes  He presents for his follow-up diabetic visit. He has type 2 diabetes mellitus. Onset time: He was diagnosed at approximate age of 25 years. His disease course has been improving (Since his last visit, he developed Coumadin toxicity which resulted in left knee hemarthrosis which required drainage and currently using a walker while doing  physical therapy.). There are no hypoglycemic associated symptoms. Pertinent negatives for hypoglycemia include no confusion, headaches, pallor or seizures. There are no diabetic associated symptoms. Pertinent negatives for diabetes include no chest pain, no fatigue, no polydipsia, no polyphagia, no polyuria and no weakness. There are no hypoglycemic complications. Symptoms are improving. There are no diabetic complications. Risk factors for coronary artery disease include hypertension, dyslipidemia and sedentary lifestyle. Current diabetic treatment includes oral agent (dual therapy). He is compliant with treatment most of the time. His weight is stable. He is following a generally unhealthy diet. His home blood glucose trend is increasing steadily. His breakfast blood glucose range is generally 140-180 mg/dl. His bedtime blood glucose range is generally 140-180 mg/dl. His overall blood glucose range is 140-180 mg/dl. An ACE inhibitor/angiotensin II receptor blocker is being taken.  Hypertension  This is a chronic problem. The current episode started more than 1 year ago. Pertinent negatives include no chest pain, headaches, neck pain, palpitations or shortness of breath. Risk factors for coronary artery disease include diabetes mellitus, dyslipidemia, male gender, sedentary lifestyle and obesity. Past treatments include ACE inhibitors and calcium channel blockers.  Hyperlipidemia  This is a chronic problem. The current episode started more than 1 year ago. Exacerbating diseases include diabetes. Pertinent negatives include no chest pain, myalgias or shortness of breath. Current antihyperlipidemic treatment includes statins. Risk factors for coronary artery disease include dyslipidemia, diabetes mellitus, hypertension, male sex, a sedentary lifestyle and obesity.    Review of Systems  Constitutional: Negative for fatigue and unexpected weight change.  HENT: Negative for dental problem, mouth sores and  trouble swallowing.   Eyes: Negative for visual disturbance.  Respiratory: Negative for cough, choking, chest tightness, shortness of breath and wheezing.   Cardiovascular: Negative for chest pain, palpitations and leg swelling.  Gastrointestinal: Negative for abdominal distention, abdominal pain, constipation, diarrhea, nausea and vomiting.  Endocrine: Negative for polydipsia, polyphagia and polyuria.  Genitourinary: Negative for dysuria, flank pain, hematuria and urgency.  Musculoskeletal: Positive for gait problem. Negative for back pain, myalgias and neck pain.  Skin: Negative for pallor, rash and wound.  Neurological: Negative for seizures, syncope, weakness, numbness and headaches.  Psychiatric/Behavioral: Negative.  Negative for confusion and dysphoric mood.    Objective:    BP 125/78   Pulse (!) 54   Ht 5\' 10"  (1.778 m)   Wt 211 lb (95.7 kg)   BMI 30.28 kg/m   Wt Readings from Last 3 Encounters:  01/20/18 211 lb (95.7 kg)  12/05/17 211 lb (95.7 kg)  10/21/17 210 lb (95.3 kg)    Physical Exam  Constitutional: He is oriented to person, place, and time. He appears well-developed and well-nourished. He is cooperative. No distress.  HENT:  Head: Normocephalic and atraumatic.  Eyes: EOM are normal.  Neck: Normal range of motion. Neck supple. No tracheal deviation present. No thyromegaly present.  Cardiovascular: Normal rate, S1 normal, S2 normal and normal heart sounds. Exam reveals no gallop.  No murmur heard. Pulses:      Dorsalis pedis pulses are 1+ on the right side, and 1+ on the left side.       Posterior tibial pulses are 1+ on the right side, and 1+ on the left side.  Pulmonary/Chest: Breath sounds normal. No respiratory distress. He has no wheezes.  Abdominal: Soft. Bowel sounds are normal. He exhibits no distension. There is no tenderness. There is no guarding and no CVA tenderness.  Musculoskeletal: He exhibits no edema.       Right shoulder: He exhibits no  swelling and no deformity.  Neurological: He is alert and oriented to person, place, and time. He has normal strength and normal reflexes. No cranial nerve deficit or sensory deficit. Gait normal.  Skin: Skin is warm. No rash noted. No cyanosis. Nails show no clubbing.  Psychiatric: His speech is normal. Cognition and memory are normal.  Reluctant affect.    Results for orders placed or performed in visit on 34/19/37  Basic metabolic panel  Result Value Ref Range   BUN 16 4 - 21   Creatinine 1.3 0.6 - 1.3  Hemoglobin A1c  Result Value Ref Range   Hemoglobin A1C 7 5 came to see on Monday.4 daycare    Complete Blood Count (Most recent): Lab Results  Component Value Date   WBC 6.6 08/31/2013   HGB 14.0 08/31/2013   HCT 40.1 08/31/2013   MCV 75.9 (L) 08/31/2013   PLT 235 08/31/2013   Chemistry (most recent): Lab Results  Component Value Date   NA 140 08/31/2013   K 4.9 08/31/2013   CL 102 08/31/2013   CO2 29 08/31/2013   BUN 16 01/12/2018   CREATININE 1.3 01/12/2018   Lipid Panel     Component Value Date/Time   CHOL 135 10/14/2017   TRIG 197 (A) 10/14/2017   HDL 39 10/14/2017   LDLCALC 56 10/14/2017    Assessment & Plan:   1. Type 2 diabetes mellitus without complication, with long-term current use of insulin ([place])  He  came with stable blood glucose profile, A1c improving to 7.4% .   He still admits to dietary indiscretion, consumes ice cream regularly.   Glucose log records pertaining to this visit,  to be scanned into patient's records.  -Patient remains at a high risk for more acute and chronic complications of diabetes which include CAD, CVA, CKD, retinopathy, and neuropathy. These are all discussed in detail with the patient. -I have re-counseled the patient on diet management and weight loss, by adopting a carbohydrate restricted / protein rich  Diet. Patient is advised to stick to a routine mealtimes to eat 3 meals  a day and avoid unnecessary snacks ( to  snack only to correct hypoglycemia).  -  Suggestion is made for him to avoid simple carbohydrates  from his diet including Cakes, Sweet Desserts / Pastries, Ice Cream, Soda (diet and regular), Sweet Tea, Candies, Chips, Cookies, Store Bought Juices, Alcohol in Excess of  1-2 drinks a day, Artificial Sweeteners, and "Sugar-free" Products. This will help patient  to have stable blood glucose profile and potentially avoid unintended weight gain.  -He is following with Jearld Fenton, CDE for diabetes education.  - He is  willing to continue on basal insulin. - He is now more comfortable and tolerating Levemir. I'll proceed to increase Levemir to 42 units units at bedtime associated with monitoring of blood glucose 2 times a day-before breakfast  and at bedtime. -Patient is encouraged to call clinic for blood glucose levels less than 70 or above 200 mg /dl x 3.  - I will lower his metformin to 500 mg p.o. twice daily , therapeutically suitable for patient.  - He is generally hesitant and reluctant to add medications. - Patient is not a suitable candidate for incretin therapy  Nor SGLT2i.  - Patient specific target  for A1c; LDL, HDL, Triglycerides, and  Waist Circumference were discussed in detail.  2) BP/HTN: His blood pressure is controlled to target.  I advised him to continue current medications including lisinopril 20 mg p.o. daily.   3) Lipids/HPL: His recent lipid panel showed improved LDL of 56.  He is advised to continue pravastatin 40 mg p.o. nightly.   4)  Weight/Diet: He following with Jearld Fenton, CD for diabetes education.  No successful in weight loss,  exercise, and carbohydrates information provided.  5) Chronic Care/Health Maintenance:  -Patient on ACEI and Statin medications and encouraged to continue to follow up with Ophthalmology, Podiatrist at least yearly or according to recommendations, and advised to stay away from smoking. I have recommended yearly flu vaccine and  pneumonia vaccination at least every 5 years; moderate intensity exercise for up to 150 minutes weekly; and  sleep for at least 7 hours a day.  - Time spent with the patient: 25 min, of which >50% was spent in reviewing his blood glucose logs , discussing his hypo- and hyper-glycemic episodes, reviewing his current and  previous labs and insulin doses and developing a plan to avoid hypo- and hyper-glycemia. Please refer to Patient Instructions for Blood Glucose Monitoring and Insulin/Medications Dosing Guide"  in media tab for additional information. Daniel Schaefer participated in the discussions, expressed understanding, and voiced agreement with the above plans.  All questions were answered to his satisfaction. he is encouraged to contact clinic should he have any questions or concerns prior to his return visit.  Follow up plan: Return in about 4 months (around 05/23/2018) for meter, and logs.  Glade Lloyd, MD Phone: [phone]  Fax: [phone]  -  This note was partially dictated with voice recognition software. Similar sounding words can be transcribed inadequately or may not  be corrected upon review.  01/20/2018, 12:02 PM

## 2018-01-31 ENCOUNTER — Encounter: Payer: Medicare Other | Attending: Family Medicine | Admitting: Nutrition

## 2018-01-31 ENCOUNTER — Encounter: Payer: Self-pay | Admitting: Nutrition

## 2018-01-31 VITALS — Ht 70.5 in | Wt 210.4 lb

## 2018-01-31 DIAGNOSIS — E669 Obesity, unspecified: Secondary | ICD-10-CM

## 2018-01-31 DIAGNOSIS — IMO0002 Reserved for concepts with insufficient information to code with codable children: Secondary | ICD-10-CM

## 2018-01-31 DIAGNOSIS — E1159 Type 2 diabetes mellitus with other circulatory complications: Secondary | ICD-10-CM | POA: Insufficient documentation

## 2018-01-31 DIAGNOSIS — E1165 Type 2 diabetes mellitus with hyperglycemia: Secondary | ICD-10-CM

## 2018-01-31 DIAGNOSIS — Z713 Dietary counseling and surveillance: Secondary | ICD-10-CM | POA: Diagnosis not present

## 2018-01-31 DIAGNOSIS — Z794 Long term (current) use of insulin: Secondary | ICD-10-CM | POA: Diagnosis not present

## 2018-01-31 DIAGNOSIS — E118 Type 2 diabetes mellitus with unspecified complications: Secondary | ICD-10-CM

## 2018-01-31 NOTE — Patient Instructions (Addendum)
Goals 1. Continue up the great job 2. Start back walking and using exericse equipment. 3. Increase high fiber foods. 4. Use more herbs and spices. Keep A1C down to 7%

## 2018-01-31 NOTE — Progress Notes (Signed)
  Medical Nutrition Therapy:  Appt start time: 1030 end time:  1100  Assessment:  Primary concerns today: Diabetes Type 2.  A1C 7.5 down from 8.3%.  Wt 210 lbs-dwn 2-3 lbs. . Levemir 42 units a day and 500 mg BID of Metformin. Has cut down on all diet sodas, and eating a lot more fresh fruits and vegetables.  His cares for his grandkids.Mowing yard twice a week. Making slow and steady progress.  Lab Results  Component Value Date   HGBA1C 7 5 came to see on Monday.4 daycare 01/12/2018   Lab Results  Component Value Date   CHOL 135 10/14/2017   CHOL 197 06/28/2016   Lab Results  Component Value Date   HDL 39 10/14/2017   HDL 44 06/28/2016   Lab Results  Component Value Date   LDLCALC 56 10/14/2017   LDLCALC 126 06/28/2016   Lab Results  Component Value Date   TRIG 197 (A) 10/14/2017   TRIG 136 06/28/2016   Preferred Learning Style:  No preference indicated   Learning Readiness:  Ready  Change in progress   MEDICATIONS:See list   DIETARY INTAKE:  24-hr recall:  B ( AM): Eggs and oatmeal  Snk ( AM):  L ( PM): [place] sandwich and some fruit, water Snk ( PM):  D ( PM): Meat and vegetables, water, fruit Snk ( PM): apple  Beverages: water Usual physical activity: ADL   Estimated energy needs: 1800  calories 200  g carbohydrates 135 g protein 50 g fat  Progress Towards Goal(s):  In progress.   Nutritional Diagnosis:  NB-1.1 Food and nutrition-related knowledge deficit As related to Diabets.  As evidenced by A1C 7.6.    Intervention:  Nutrition and Diabetes education provided on My Plate, CHO counting, meal planning, portion sizes, timing of meals, avoiding snacks between meals unless having a low blood sugar, target ranges for A1C and blood sugars, signs/symptoms and treatment of hyper/hypoglycemia, monitoring blood sugars, taking medications as prescribed, benefits of exercising 30 minutes per day and prevention of complications of DM. Marland Kitchen  Goals 1.  Continue up the great job 2. Start back walking and using exericse equipment. 3. Increase high fiber foods. 4. Use more herbs and spices. Keep A1C down to 7%   Teaching Method Utilized:  Visual Auditory Hands on  Handouts given during visit include:  The Plate Method    Meal Plan Card   Barriers to learning/adherence to lifestyle change:  none  Demonstrated degree of understanding via:  Teach Back   Monitoring/Evaluation:  Dietary intake, exercise, meal planning, and body weight in 3 month(s).

## 2018-02-02 ENCOUNTER — Ambulatory Visit (INDEPENDENT_AMBULATORY_CARE_PROVIDER_SITE_OTHER): Payer: Medicare Other | Admitting: *Deleted

## 2018-02-02 DIAGNOSIS — I4891 Unspecified atrial fibrillation: Secondary | ICD-10-CM | POA: Diagnosis not present

## 2018-02-02 DIAGNOSIS — Z5181 Encounter for therapeutic drug level monitoring: Secondary | ICD-10-CM | POA: Diagnosis not present

## 2018-02-02 LAB — POCT INR: INR: 2.3 (ref 2.0–3.0)

## 2018-02-02 NOTE — Patient Instructions (Signed)
Continue coumadin 1 tablet daily except 1/2 tablet on Wednesdays Continue greens Recheck in 6 weeks 

## 2018-02-09 ENCOUNTER — Other Ambulatory Visit: Payer: Self-pay | Admitting: Cardiology

## 2018-02-15 ENCOUNTER — Other Ambulatory Visit: Payer: Self-pay

## 2018-02-15 MED ORDER — INSULIN DETEMIR 100 UNIT/ML FLEXPEN: 42.0000 [IU] | PEN_INJECTOR | Freq: Every day | SUBCUTANEOUS | 3 refills | Status: DC

## 2018-03-17 ENCOUNTER — Ambulatory Visit (INDEPENDENT_AMBULATORY_CARE_PROVIDER_SITE_OTHER): Payer: Medicare Other | Admitting: *Deleted

## 2018-03-17 DIAGNOSIS — Z5181 Encounter for therapeutic drug level monitoring: Secondary | ICD-10-CM

## 2018-03-17 DIAGNOSIS — I4891 Unspecified atrial fibrillation: Secondary | ICD-10-CM | POA: Diagnosis not present

## 2018-03-17 LAB — POCT INR: INR: 2.4 (ref 2.0–3.0)

## 2018-03-17 NOTE — Patient Instructions (Signed)
Continue coumadin 1 tablet daily except 1/2 tablet on Wednesdays Continue greens Recheck in 6 weeks 

## 2018-03-21 NOTE — Progress Notes (Signed)
Cardiology Office Note  Date: 03/23/2018   ID: Daniel Schaefer, DOB 1951-02-24, MRN [id number]  PCP: Sandi Mealy, MD  Primary Cardiologist: Rozann Lesches, MD   Chief Complaint  Patient presents with  . Atrial Fibrillation    History of Present Illness: Daniel Schaefer is a 67 y.o. male last seen in November 2018.  He is here for a routine follow-up visit.  Reports only occasional palpitations that are limited, no exertional chest pain or unusual shortness of breath.  He continues on Coumadin with follow-up in the anticoagulation clinic.  Recent INR was 2.4.  He reports no spontaneous bleeding problems.  I reviewed his current medications.  Heart rate control regimen includes Lopressor at 25 mg twice daily.  He is following lipid status and type 2 diabetes mellitus with PCP.  Past Medical History:  Diagnosis Date  . Atrial fibrillation [facility])    Reportedly diagnosed November 2011  . Essential hypertension, benign   . Prostate cancer [facility])    Radiation implants  . Type 2 diabetes mellitus ([place])     Past Surgical History:  Procedure Laterality Date  . Gold seed placement    . PROSTATE BIOPSY      Current Outpatient Medications  Medication Sig Dispense Refill  . amLODipine (NORVASC) 5 MG tablet Take 1 tablet by mouth daily.    . cetirizine (ZYRTEC) 10 MG tablet Take 10 mg by mouth daily.    . furosemide (LASIX) 20 MG tablet TAKE ONE TABLET BY MOUTH DAILY 60 tablet 0  . GLOBAL EASE INJECT PEN NEEDLES 31G X 8 MM MISC USE 1 DAILY AS DIRECTED - RUN ON CASH $15 - PER PC. 100 each 5  . glucose blood test strip Use as instructed 100 each 12  . Insulin Detemir (LEVEMIR FLEXPEN [place]) Inject 46 Units into the skin daily.    . Insulin Pen Needle (B-D ULTRAFINE III SHORT PEN) 31G X 8 MM MISC 1 each by Does not apply route as directed. 50 each 2  . Lancets MISC 1 each by Does not apply route as directed. 100 each 3  . lisinopril (PRINIVIL,ZESTRIL) 20 MG tablet Take 20 mg by  mouth daily.    . metFORMIN (GLUCOPHAGE) 1000 MG tablet Take 500 mg by mouth 2 (two) times daily.    . metoprolol tartrate (LOPRESSOR) 25 MG tablet TAKE ONE TABLET BY MOUTH TWICE DAILY 60 tablet 6  . omeprazole (PRILOSEC) 20 MG capsule Take 20 mg by mouth daily.    . potassium chloride (K-DUR) 10 MEQ tablet TAKE ONE TABLET BY MOUTH ONCE DAILY 90 tablet 3  . pravastatin (PRAVACHOL) 40 MG tablet Take 1 tablet by mouth at bedtime.    . tamsulosin (FLOMAX) 0.4 MG CAPS capsule Take 0.4 mg by mouth.    . triamcinolone cream (KENALOG) 0.1 % Apply 1 application topically 2 (two) times daily.    Marland Kitchen warfarin (COUMADIN) 5 MG tablet Take 1 tablet daily except 1/2 tablet on Wednesdays 35 tablet 6   No current facility-administered medications for this visit.    Allergies:  Bactrim [sulfamethoxazole-trimethoprim]; Other; and Tresiba flextouch [insulin degludec]   Social History: The patient  reports that he has never smoked. He has never used smokeless tobacco. He reports that he does not drink alcohol or use drugs.   ROS:  Please see the history of present illness. Otherwise, complete review of systems is positive for none.  All other systems are reviewed and negative.   Physical Exam:  VS:  BP 125/85   Pulse 66   Ht 5\' 10"  (1.778 m)   Wt 211 lb 9.6 oz (96 kg)   SpO2 97%   BMI 30.36 kg/m , BMI Body mass index is 30.36 kg/m.  Wt Readings from Last 3 Encounters:  03/23/18 211 lb 9.6 oz (96 kg)  01/31/18 210 lb 6.4 oz (95.4 kg)  01/20/18 211 lb (95.7 kg)    General: Patient appears comfortable at rest. HEENT: Conjunctiva and lids normal, oropharynx clear. Neck: Supple, no elevated JVP or carotid bruits, no thyromegaly. Lungs: Clear to auscultation, nonlabored breathing at rest. Cardiac: Irregularly irregular, no S3 or significant systolic murmur. Abdomen: Soft, nontender, bowel sounds present. Extremities: Trace ankle edema, distal pulses 2+.  ECG: I personally reviewed the tracing from  08/11/2017 which showed rate controlled atrial fibrillation with nonspecific ST changes and decreased R wave progression.  Recent Labwork: 01/12/2018: BUN 16; Creatinine 1.3     Component Value Date/Time   CHOL 135 10/14/2017   TRIG 197 (A) 10/14/2017   HDL 39 10/14/2017   LDLCALC 56 10/14/2017    Other Studies Reviewed Today:  Echocardiogram 04/04/2013: Study Conclusions  - Left ventricle: The cavity size was normal. Wall thickness was increased in a pattern of mild LVH. Systolic function was normal. The estimated ejection fraction was in the range of 50% to 55%. Wall motion was normal; there were no regional wall motion abnormalities. The study is not technically sufficient to allow evaluation of LV diastolic function, due to underlying atrial fibrillation. - Left atrium: The atrium was mildly dilated. - Pulmonic valve: Mild regurgitation.  Assessment and Plan:  1.  Permanent atrial fibrillation.  Continue strategy of heart rate control and anticoagulation.  He reports no bleeding problems on Coumadin.  2.  Essential hypertension, blood pressure is well controlled today on Norvasc and lisinopril.  Current medicines were reviewed with the patient today.  Disposition: Follow-up in 6 months.  Signed, Satira Sark, MD, [facility] 03/23/2018 11:32 AM    [place] [facility] [place], [place],  [address] Phone: [phone]; Fax: [phone]

## 2018-03-23 ENCOUNTER — Ambulatory Visit (INDEPENDENT_AMBULATORY_CARE_PROVIDER_SITE_OTHER): Payer: Medicare Other | Admitting: Cardiology

## 2018-03-23 ENCOUNTER — Encounter: Payer: Self-pay | Admitting: Cardiology

## 2018-03-23 ENCOUNTER — Encounter

## 2018-03-23 VITALS — BP 125/85 | HR 66 | Ht 70.0 in | Wt 211.6 lb

## 2018-03-23 DIAGNOSIS — I10 Essential (primary) hypertension: Secondary | ICD-10-CM | POA: Diagnosis not present

## 2018-03-23 DIAGNOSIS — I482 Chronic atrial fibrillation: Secondary | ICD-10-CM

## 2018-03-23 DIAGNOSIS — I4821 Permanent atrial fibrillation: Secondary | ICD-10-CM

## 2018-03-23 NOTE — Patient Instructions (Signed)

## 2018-04-03 ENCOUNTER — Other Ambulatory Visit: Payer: Self-pay | Admitting: Cardiology

## 2018-04-11 ENCOUNTER — Other Ambulatory Visit: Payer: Self-pay | Admitting: Cardiology

## 2018-05-02 ENCOUNTER — Ambulatory Visit (INDEPENDENT_AMBULATORY_CARE_PROVIDER_SITE_OTHER): Payer: Medicare Other | Admitting: Pharmacist

## 2018-05-02 DIAGNOSIS — I4891 Unspecified atrial fibrillation: Secondary | ICD-10-CM | POA: Diagnosis not present

## 2018-05-02 DIAGNOSIS — Z5181 Encounter for therapeutic drug level monitoring: Secondary | ICD-10-CM

## 2018-05-02 LAB — POCT INR: INR: 2.3 (ref 2.0–3.0)

## 2018-05-02 NOTE — Patient Instructions (Signed)
Description   Continue coumadin 1 tablet daily except 1/2 tablet on Wednesdays Continue greens Recheck in 6 weeks     

## 2018-05-03 ENCOUNTER — Other Ambulatory Visit: Payer: Self-pay

## 2018-05-03 MED ORDER — METFORMIN HCL 500 MG PO TABS: 500.0000 mg | ORAL_TABLET | Freq: Two times a day (BID) | ORAL | 1 refills | Status: DC

## 2018-05-16 LAB — MICROALBUMIN, URINE: Microalb, Ur: 4.1

## 2018-05-16 LAB — HEMOGLOBIN A1C: Hemoglobin A1C: 7.8

## 2018-05-16 LAB — BASIC METABOLIC PANEL
BUN: 24 — AB (ref 4–21)
Creatinine: 1.2 (ref 0.6–1.3)

## 2018-05-23 ENCOUNTER — Encounter: Payer: Self-pay | Admitting: "Endocrinology

## 2018-05-23 ENCOUNTER — Encounter: Payer: Medicare Other | Attending: "Endocrinology | Admitting: Nutrition

## 2018-05-23 ENCOUNTER — Ambulatory Visit (INDEPENDENT_AMBULATORY_CARE_PROVIDER_SITE_OTHER): Payer: Medicare Other | Admitting: "Endocrinology

## 2018-05-23 VITALS — BP 126/70 | HR 68 | Ht 70.0 in | Wt 211.0 lb

## 2018-05-23 DIAGNOSIS — IMO0002 Reserved for concepts with insufficient information to code with codable children: Secondary | ICD-10-CM

## 2018-05-23 DIAGNOSIS — E1159 Type 2 diabetes mellitus with other circulatory complications: Secondary | ICD-10-CM | POA: Insufficient documentation

## 2018-05-23 DIAGNOSIS — E782 Mixed hyperlipidemia: Secondary | ICD-10-CM | POA: Diagnosis not present

## 2018-05-23 DIAGNOSIS — Z713 Dietary counseling and surveillance: Secondary | ICD-10-CM | POA: Diagnosis not present

## 2018-05-23 DIAGNOSIS — I10 Essential (primary) hypertension: Secondary | ICD-10-CM

## 2018-05-23 DIAGNOSIS — E1165 Type 2 diabetes mellitus with hyperglycemia: Secondary | ICD-10-CM

## 2018-05-23 DIAGNOSIS — Z794 Long term (current) use of insulin: Secondary | ICD-10-CM | POA: Diagnosis not present

## 2018-05-23 DIAGNOSIS — E118 Type 2 diabetes mellitus with unspecified complications: Secondary | ICD-10-CM

## 2018-05-23 DIAGNOSIS — E669 Obesity, unspecified: Secondary | ICD-10-CM

## 2018-05-23 NOTE — Patient Instructions (Addendum)
Goals 1. Work on portion sizes 2. Start exercising   Lose 1/2- 1 lb per week

## 2018-05-23 NOTE — Patient Instructions (Signed)

## 2018-05-23 NOTE — Progress Notes (Signed)
  Medical Nutrition Therapy:  Appt start time: 1100  end time:  1130 Assessment:  Primary concerns today: Diabetes Type 2.  Saw Dr. Dorris Fetch going to start with 50 units and is going to try [place]. Can't tolerate [name]- got hive and blisters . [employer] won't cover - too expensive. Still on Metfmormin 500 mg BID.  Admits to over eating at times.Tends to snack at night. Had a binging episode on dessert last week. Willing to get back to exercising and doing [place] [place] at the [facility] in [place]. BS in am 130-180's. Before super 170-200's and bedtime 150-250's. Labs brought in. 05/16/18 Cret 1.24 mg/dl eGFR 58. MIcroalbumin/cret  57.3- H  Microalbumin urine 4.1-H A1C 7.8%, UP FROM 7.5%.     Lab Results  Component Value Date   HGBA1C 7 5 came to see on Monday.4 daycare 01/12/2018   Lab Results  Component Value Date   CHOL 135 10/14/2017   CHOL 197 06/28/2016   Lab Results  Component Value Date   HDL 39 10/14/2017   HDL 44 06/28/2016   Lab Results  Component Value Date   LDLCALC 56 10/14/2017   LDLCALC 126 06/28/2016   Lab Results  Component Value Date   TRIG 197 (A) 10/14/2017   TRIG 136 06/28/2016   Preferred Learning Style:  No preference indicated   Learning Readiness:  Ready  Change in progress   MEDICATIONS:See list   DIETARY INTAKE:  24-hr recall:  B ( AM): Eggs and oatmeal  Snk ( AM):  L ( PM): [place] sandwich and some fruit, water Snk ( PM):  D ( PM): Meat and vegetables, water, fruit Snk ( PM): apple  Beverages: water Usual physical activity: ADL   Estimated energy needs: 1800  calories 200  g carbohydrates 135 g protein 50 g fat  Progress Towards Goal(s):  In progress.   Nutritional Diagnosis:  NB-1.1 Food and nutrition-related knowledge deficit As related to Diabets.  As evidenced by A1C 7.6.    Intervention:  Nutrition and Diabetes education provided on My Plate, CHO counting, meal planning, portion sizes, timing of meals, avoiding  snacks between meals unless having a low blood sugar, target ranges for A1C and blood sugars, signs/symptoms and treatment of hyper/hypoglycemia, monitoring blood sugars, taking medications as prescribed, benefits of exercising 30 minutes per day and prevention of complications of DM. Marland Kitchen  Goals 1. Work on portion sizes 2. Start exercising     Teaching Method Utilized:  Visual Auditory Hands on  Handouts given during visit include:  The Plate Method    Meal Plan Card   Barriers to learning/adherence to lifestyle change:  none  Demonstrated degree of understanding via:  Teach Back   Monitoring/Evaluation:  Dietary intake, exercise, meal planning, and body weight  4 months.Marland Kitchen

## 2018-05-23 NOTE — Progress Notes (Signed)
Endocrinology follow-up note   Subjective:    Patient ID: Daniel Schaefer, male    DOB: 04/30/1951,    Past Medical History:  Diagnosis Date  . Atrial fibrillation [facility])    Reportedly diagnosed November 2011  . Essential hypertension, benign   . Prostate cancer [facility])    Radiation implants  . Type 2 diabetes mellitus ([place])    Past Surgical History:  Procedure Laterality Date  . Gold seed placement    . PROSTATE BIOPSY     Social History   Socioeconomic History  . Marital status: Divorced    Spouse name: Not on file  . Number of children: Not on file  . Years of education: Not on file  . Highest education level: Not on file  Occupational History  . Not on file  Social Needs  . Financial resource strain: Not on file  . Food insecurity:    Worry: Not on file    Inability: Not on file  . Transportation needs:    Medical: Not on file    Non-medical: Not on file  Tobacco Use  . Smoking status: Never Smoker  . Smokeless tobacco: Never Used  Substance and Sexual Activity  . Alcohol use: No    Alcohol/week: 0.0 standard drinks    Frequency: Never  . Drug use: No  . Sexual activity: Not on file  Lifestyle  . Physical activity:    Days per week: Not on file    Minutes per session: Not on file  . Stress: Not on file  Relationships  . Social connections:    Talks on phone: Not on file    Gets together: Not on file    Attends religious service: Not on file    Active member of club or organization: Not on file    Attends meetings of clubs or organizations: Not on file    Relationship status: Not on file  Other Topics Concern  . Not on file  Social History Narrative  . Not on file   Outpatient Encounter Medications as of 05/23/2018  Medication Sig  . amLODipine (NORVASC) 5 MG tablet Take 1 tablet by mouth daily.  . cetirizine (ZYRTEC) 10 MG tablet Take 10 mg by mouth daily.  . furosemide (LASIX) 20 MG tablet TAKE 1 TABLET BY MOUTH DAILY  . GLOBAL EASE INJECT  PEN NEEDLES 31G X 8 MM MISC USE 1 DAILY AS DIRECTED - RUN ON CASH $15 - PER PC.  Marland Kitchen glucose blood test strip Use as instructed  . Insulin Detemir (LEVEMIR FLEXPEN [place]) Inject 50 Units into the skin at bedtime.  . Insulin Pen Needle (B-D ULTRAFINE III SHORT PEN) 31G X 8 MM MISC 1 each by Does not apply route as directed.  . Lancets MISC 1 each by Does not apply route as directed.  Marland Kitchen lisinopril (PRINIVIL,ZESTRIL) 20 MG tablet Take 20 mg by mouth daily.  . metFORMIN (GLUCOPHAGE) 500 MG tablet Take 1 tablet (500 mg total) by mouth 2 (two) times daily with a meal.  . metoprolol tartrate (LOPRESSOR) 25 MG tablet TAKE ONE TABLET BY MOUTH TWICE DAILY  . omeprazole (PRILOSEC) 20 MG capsule Take 20 mg by mouth daily.  . potassium chloride (K-DUR) 10 MEQ tablet TAKE ONE TABLET BY MOUTH ONCE DAILY  . potassium chloride (K-DUR,KLOR-CON) 10 MEQ tablet TAKE ONE TABLET BY MOUTH EVERY DAY  . pravastatin (PRAVACHOL) 40 MG tablet Take 1 tablet by mouth at bedtime.  . tamsulosin (FLOMAX) 0.4 MG CAPS  capsule Take 0.4 mg by mouth.  . triamcinolone cream (KENALOG) 0.1 % Apply 1 application topically 2 (two) times daily.  Marland Kitchen warfarin (COUMADIN) 5 MG tablet Take 1 tablet daily except 1/2 tablet on Wednesdays   No facility-administered encounter medications on file as of 05/23/2018.    ALLERGIES: Allergies  Allergen Reactions  . Bactrim [Sulfamethoxazole-Trimethoprim]   . Other     Cannot mix warfarin and bactrim   . [name] Flextouch [Insulin Degludec] Rash   VACCINATION STATUS:  There is no immunization history on file for this patient.  Diabetes  He presents for his follow-up diabetic visit. He has type 2 diabetes mellitus. Onset time: He was diagnosed at approximate age of 7 years. His disease course has been worsening (Since his last visit, he developed Coumadin toxicity which resulted in left knee hemarthrosis which required drainage and currently using a walker while doing physical therapy.). There are no  hypoglycemic associated symptoms. Pertinent negatives for hypoglycemia include no confusion, headaches, pallor or seizures. There are no diabetic associated symptoms. Pertinent negatives for diabetes include no chest pain, no fatigue, no polydipsia, no polyphagia, no polyuria and no weakness. There are no hypoglycemic complications. Symptoms are worsening. There are no diabetic complications. Risk factors for coronary artery disease include hypertension, dyslipidemia and sedentary lifestyle. Current diabetic treatment includes oral agent (dual therapy). He is compliant with treatment most of the time. His weight is stable. He is following a generally unhealthy diet. His home blood glucose trend is increasing steadily. His breakfast blood glucose range is generally 140-180 mg/dl. His bedtime blood glucose range is generally 140-180 mg/dl. His overall blood glucose range is 140-180 mg/dl. An ACE inhibitor/angiotensin II receptor blocker is being taken.  Hypertension  This is a chronic problem. The current episode started more than 1 year ago. Pertinent negatives include no chest pain, headaches, neck pain, palpitations or shortness of breath. Risk factors for coronary artery disease include diabetes mellitus, dyslipidemia, male gender, sedentary lifestyle and obesity. Past treatments include ACE inhibitors and calcium channel blockers.  Hyperlipidemia  This is a chronic problem. The current episode started more than 1 year ago. Exacerbating diseases include diabetes. Pertinent negatives include no chest pain, myalgias or shortness of breath. Current antihyperlipidemic treatment includes statins. Risk factors for coronary artery disease include dyslipidemia, diabetes mellitus, hypertension, male sex, a sedentary lifestyle and obesity.    Review of Systems  Constitutional: Negative for fatigue and unexpected weight change.  HENT: Negative for dental problem, mouth sores and trouble swallowing.   Eyes: Negative  for visual disturbance.  Respiratory: Negative for cough, choking, chest tightness, shortness of breath and wheezing.   Cardiovascular: Negative for chest pain, palpitations and leg swelling.  Gastrointestinal: Negative for abdominal distention, abdominal pain, constipation, diarrhea, nausea and vomiting.  Endocrine: Negative for polydipsia, polyphagia and polyuria.  Genitourinary: Negative for dysuria, flank pain, hematuria and urgency.  Musculoskeletal: Negative for back pain, gait problem, myalgias and neck pain.  Skin: Negative for pallor, rash and wound.  Neurological: Negative for seizures, syncope, weakness, numbness and headaches.  Psychiatric/Behavioral: Negative for confusion and dysphoric mood.    Objective:    BP 126/70   Pulse 68   Ht 5\' 10"  (1.778 m)   Wt 211 lb (95.7 kg)   BMI 30.28 kg/m   Wt Readings from Last 3 Encounters:  05/23/18 211 lb (95.7 kg)  03/23/18 211 lb 9.6 oz (96 kg)  01/31/18 210 lb 6.4 oz (95.4 kg)    Physical Exam  Constitutional: He is oriented to person, place, and time. He appears well-developed and well-nourished. He is cooperative. No distress.  HENT:  Head: Normocephalic and atraumatic.  Eyes: EOM are normal.  Neck: Normal range of motion. Neck supple. No tracheal deviation present. No thyromegaly present.  Cardiovascular: Normal rate, S1 normal, S2 normal and normal heart sounds. Exam reveals no gallop.  No murmur heard. Pulses:      Dorsalis pedis pulses are 1+ on the right side, and 1+ on the left side.       Posterior tibial pulses are 1+ on the right side, and 1+ on the left side.  Pulmonary/Chest: Breath sounds normal. No respiratory distress. He has no wheezes.  Abdominal: Soft. Bowel sounds are normal. He exhibits no distension. There is no tenderness. There is no guarding and no CVA tenderness.  Musculoskeletal: He exhibits no edema.       Right shoulder: He exhibits no swelling and no deformity.  Neurological: He is alert and  oriented to person, place, and time. He has normal strength and normal reflexes. No cranial nerve deficit or sensory deficit. Gait normal.  Skin: Skin is warm. No rash noted. No cyanosis. Nails show no clubbing.  Psychiatric: His speech is normal. Cognition and memory are normal.    Results for orders placed or performed in visit on 05/23/18  Microalbumin, urine  Result Value Ref Range   Microalb, Ur 4.1   Basic metabolic panel  Result Value Ref Range   BUN 24 (A) 4 - 21   Creatinine 1.2 0.6 - 1.3  Hemoglobin A1c  Result Value Ref Range   Hemoglobin A1C 7.8    Complete Blood Count (Most recent): Lab Results  Component Value Date   WBC 6.6 08/31/2013   HGB 14.0 08/31/2013   HCT 40.1 08/31/2013   MCV 75.9 (L) 08/31/2013   PLT 235 08/31/2013   Chemistry (most recent): Lab Results  Component Value Date   NA 140 08/31/2013   K 4.9 08/31/2013   CL 102 08/31/2013   CO2 29 08/31/2013   BUN 24 (A) 05/16/2018   CREATININE 1.2 05/16/2018   Lipid Panel     Component Value Date/Time   CHOL 135 10/14/2017   TRIG 197 (A) 10/14/2017   HDL 39 10/14/2017   LDLCALC 56 10/14/2017    Assessment & Plan:   1. Type 2 diabetes mellitus without complication, with long-term current use of insulin ([place])  He  came with higher than target glycemic profile and A1c of 7.8% increasing from 7.4% during his last visit.    He still admits to dietary indiscretion, consumes larger portions, consumes ice cream regularly.   Glucose log records pertaining to this visit,  to be scanned into patient's records.  -Patient remains at a high risk for more acute and chronic complications of diabetes which include CAD, CVA, CKD, retinopathy, and neuropathy. These are all discussed in detail with the patient. -I have re-counseled the patient on diet management and weight loss, by adopting a carbohydrate restricted / protein rich  Diet. Patient is advised to stick to a routine mealtimes to eat 3 meals  a day  and avoid unnecessary snacks ( to snack only to correct hypoglycemia).  -  Suggestion is made for him to avoid simple carbohydrates  from his diet including Cakes, Sweet Desserts / Pastries, Ice Cream, Soda (diet and regular), Sweet Tea, Candies, Chips, Cookies, Store Bought Juices, Alcohol in Excess of  1-2 drinks a day, Artificial Sweeteners, and "  Sugar-free" Products. This will help patient to have stable blood glucose profile and potentially avoid unintended weight gain.  -He is following with Jearld Fenton, CDE for diabetes education.  - He is  willing to continue on basal insulin. - He is getting more comfortable injecting insulin-Levemir.  I advised him to increase his Levemir to 50 units nightly associated with strict monitoring of blood glucose 2 times a day-daily before breakfast and at bedtime.  -Patient is encouraged to call clinic for blood glucose levels less than 70 or above 150 mg /dl x 3. -He expresses concern of cost of medications including insulin.  I gave him a sample of Basaglar to try and will report if he tolerates it for prescription.  - I advised him to continue  metformin  500 mg p.o. twice daily , therapeutically suitable for patient.  - He is generally hesitant and reluctant to add medications. - Patient is not a suitable candidate for incretin therapy  Nor SGLT2i.  - Patient specific target  for A1c; LDL, HDL, Triglycerides, and  Waist Circumference were discussed in detail.  2) BP/HTN: His blood pressure is controlled to target.  He is advised to continue his blood pressure medications including lisinopril 20 mg p.o. daily.   3) Lipids/HPL: His recent lipid panel showed improved LDL of 56.  He is advised to continue his pravastatin 40 mg p.o. nightly.   4)  Weight/Diet: He is following with Jearld Fenton, CD for diabetes education.  No success in weight loss,  exercise, and carbohydrates information provided.  5) Chronic Care/Health Maintenance:  -Patient on  ACEI and Statin medications and encouraged to continue to follow up with Ophthalmology, Podiatrist at least yearly or according to recommendations, and advised to stay away from smoking. I have recommended yearly flu vaccine and pneumonia vaccination at least every 5 years; moderate intensity exercise for up to 150 minutes weekly; and  sleep for at least 7 hours a day.  - Time spent with the patient: 25 min, of which >50% was spent in reviewing his blood glucose logs , discussing his hypo- and hyper-glycemic episodes, reviewing his current and  previous labs and insulin doses and developing a plan to avoid hypo- and hyper-glycemia. Please refer to Patient Instructions for Blood Glucose Monitoring and Insulin/Medications Dosing Guide"  in media tab for additional information. Daniel Schaefer participated in the discussions, expressed understanding, and voiced agreement with the above plans.  All questions were answered to his satisfaction. he is encouraged to contact clinic should he have any questions or concerns prior to his return visit.   Follow up plan: Return in about 4 months (around 09/22/2018) for Follow up with Pre-visit Labs, Meter, and Logs.  Glade Lloyd, MD Phone: [phone]  Fax: [phone]  -  This note was partially dictated with voice recognition software. Similar sounding words can be transcribed inadequately or may not  be corrected upon review.  05/23/2018, 11:40 AM

## 2018-06-01 ENCOUNTER — Other Ambulatory Visit: Payer: Self-pay

## 2018-06-01 MED ORDER — BASAGLAR KWIKPEN 100 UNIT/ML ~~LOC~~ SOPN: 50.0000 [IU] | PEN_INJECTOR | Freq: Every day | SUBCUTANEOUS | 2 refills | Status: DC

## 2018-06-01 MED ORDER — INSULIN PEN NEEDLE 31G X 8 MM MISC: 1.0000 | 2 refills | Status: AC

## 2018-06-01 MED ORDER — INSULIN PEN NEEDLE 31G X 8 MM MISC: 1.0000 | 2 refills | Status: DC

## 2018-06-12 ENCOUNTER — Encounter: Payer: Self-pay | Admitting: Nutrition

## 2018-06-13 ENCOUNTER — Ambulatory Visit (INDEPENDENT_AMBULATORY_CARE_PROVIDER_SITE_OTHER): Payer: Medicare Other | Admitting: *Deleted

## 2018-06-13 DIAGNOSIS — I4891 Unspecified atrial fibrillation: Secondary | ICD-10-CM | POA: Diagnosis not present

## 2018-06-13 DIAGNOSIS — Z5181 Encounter for therapeutic drug level monitoring: Secondary | ICD-10-CM

## 2018-06-13 LAB — POCT INR: INR: 1.9 — AB (ref 2.0–3.0)

## 2018-06-13 NOTE — Patient Instructions (Signed)
Take 1 1/2 tablets tonight then resume 1 tablet daily except 1/2 tablet on Wednesdays Continue greens Recheck in 6 weeks

## 2018-07-10 ENCOUNTER — Other Ambulatory Visit: Payer: Self-pay | Admitting: Cardiology

## 2018-07-25 ENCOUNTER — Ambulatory Visit (INDEPENDENT_AMBULATORY_CARE_PROVIDER_SITE_OTHER): Payer: Medicare Other | Admitting: *Deleted

## 2018-07-25 DIAGNOSIS — I4891 Unspecified atrial fibrillation: Secondary | ICD-10-CM | POA: Diagnosis not present

## 2018-07-25 DIAGNOSIS — Z5181 Encounter for therapeutic drug level monitoring: Secondary | ICD-10-CM

## 2018-07-25 LAB — POCT INR: INR: 2.8 (ref 2.0–3.0)

## 2018-07-25 NOTE — Patient Instructions (Signed)
Continue coumadin 1 tablet daily except 1/2 tablet on Wednesdays Continue greens Recheck in 6 weeks 

## 2018-08-23 ENCOUNTER — Other Ambulatory Visit: Payer: Self-pay | Admitting: "Endocrinology

## 2018-09-05 ENCOUNTER — Ambulatory Visit (INDEPENDENT_AMBULATORY_CARE_PROVIDER_SITE_OTHER): Payer: Medicare Other | Admitting: *Deleted

## 2018-09-05 DIAGNOSIS — I4891 Unspecified atrial fibrillation: Secondary | ICD-10-CM

## 2018-09-05 DIAGNOSIS — Z5181 Encounter for therapeutic drug level monitoring: Secondary | ICD-10-CM

## 2018-09-05 LAB — POCT INR: INR: 2.3 (ref 2.0–3.0)

## 2018-09-05 NOTE — Patient Instructions (Signed)
Continue coumadin 1 tablet daily except 1/2 tablet on Wednesdays Continue greens Recheck in 6 weeks 

## 2018-09-18 LAB — BASIC METABOLIC PANEL
BUN: 20 (ref 4–21)
Creatinine: 1.4 — AB (ref 0.6–1.3)

## 2018-09-18 LAB — HEMOGLOBIN A1C: Hemoglobin A1C: 7.5

## 2018-09-18 LAB — LIPID PANEL
CHOLESTEROL: 152 (ref 0–200)
HDL: 40 (ref 35–70)
LDL Cholesterol: 72
Triglycerides: 204 — AB (ref 40–160)

## 2018-09-25 ENCOUNTER — Ambulatory Visit (INDEPENDENT_AMBULATORY_CARE_PROVIDER_SITE_OTHER): Payer: Medicare Other | Admitting: "Endocrinology

## 2018-09-25 ENCOUNTER — Encounter: Payer: Self-pay | Admitting: "Endocrinology

## 2018-09-25 ENCOUNTER — Encounter: Payer: Medicare Other | Attending: Family Medicine | Admitting: Nutrition

## 2018-09-25 ENCOUNTER — Encounter: Payer: Self-pay | Admitting: Nutrition

## 2018-09-25 VITALS — BP 140/85 | HR 71 | Ht 70.0 in | Wt 219.0 lb

## 2018-09-25 DIAGNOSIS — E1165 Type 2 diabetes mellitus with hyperglycemia: Secondary | ICD-10-CM | POA: Insufficient documentation

## 2018-09-25 DIAGNOSIS — I10 Essential (primary) hypertension: Secondary | ICD-10-CM

## 2018-09-25 DIAGNOSIS — N183 Chronic kidney disease, stage 3 unspecified: Secondary | ICD-10-CM

## 2018-09-25 DIAGNOSIS — E118 Type 2 diabetes mellitus with unspecified complications: Secondary | ICD-10-CM | POA: Diagnosis not present

## 2018-09-25 DIAGNOSIS — E782 Mixed hyperlipidemia: Secondary | ICD-10-CM | POA: Diagnosis not present

## 2018-09-25 DIAGNOSIS — Z794 Long term (current) use of insulin: Secondary | ICD-10-CM

## 2018-09-25 DIAGNOSIS — E1159 Type 2 diabetes mellitus with other circulatory complications: Secondary | ICD-10-CM | POA: Diagnosis not present

## 2018-09-25 DIAGNOSIS — IMO0002 Reserved for concepts with insufficient information to code with codable children: Secondary | ICD-10-CM

## 2018-09-25 DIAGNOSIS — E1122 Type 2 diabetes mellitus with diabetic chronic kidney disease: Secondary | ICD-10-CM

## 2018-09-25 DIAGNOSIS — E669 Obesity, unspecified: Secondary | ICD-10-CM | POA: Insufficient documentation

## 2018-09-25 MED ORDER — BASAGLAR KWIKPEN 100 UNIT/ML ~~LOC~~ SOPN: PEN_INJECTOR | SUBCUTANEOUS | 2 refills | Status: DC

## 2018-09-25 NOTE — Patient Instructions (Signed)

## 2018-09-25 NOTE — Progress Notes (Signed)
Endocrinology follow-up note   Subjective:    Patient ID: Daniel Schaefer, male    DOB: [birth year],    Past Medical History:  Diagnosis Date  . Atrial fibrillation [facility])    Reportedly diagnosed November 2011  . Essential hypertension, benign   . Prostate cancer [facility])    Radiation implants  . Type 2 diabetes mellitus ([place])    Past Surgical History:  Procedure Laterality Date  . Gold seed placement    . PROSTATE BIOPSY     Social History   Socioeconomic History  . Marital status: Divorced    Spouse name: Not on file  . Number of children: Not on file  . Years of education: Not on file  . Highest education level: Not on file  Occupational History  . Not on file  Social Needs  . Financial resource strain: Not on file  . Food insecurity:    Worry: Not on file    Inability: Not on file  . Transportation needs:    Medical: Not on file    Non-medical: Not on file  Tobacco Use  . Smoking status: Never Smoker  . Smokeless tobacco: Never Used  Substance and Sexual Activity  . Alcohol use: No    Alcohol/week: 0.0 standard drinks    Frequency: Never  . Drug use: No  . Sexual activity: Not on file  Lifestyle  . Physical activity:    Days per week: Not on file    Minutes per session: Not on file  . Stress: Not on file  Relationships  . Social connections:    Talks on phone: Not on file    Gets together: Not on file    Attends religious service: Not on file    Active member of club or organization: Not on file    Attends meetings of clubs or organizations: Not on file    Relationship status: Not on file  Other Topics Concern  . Not on file  Social History Narrative  . Not on file   Outpatient Encounter Medications as of 09/25/2018  Medication Sig  . amLODipine (NORVASC) 5 MG tablet Take 1 tablet by mouth daily.  . cetirizine (ZYRTEC) 10 MG tablet Take 10 mg by mouth daily.  . furosemide (LASIX) 20 MG tablet TAKE 1 TABLET BY MOUTH DAILY  . GLOBAL EASE INJECT  PEN NEEDLES 31G X 8 MM MISC USE 1 DAILY AS DIRECTED - RUN ON CASH $15 - PER PC.  Marland Kitchen glucose blood test strip Use as instructed  . Insulin Glargine (BASAGLAR KWIKPEN) 100 UNIT/ML SOPN INJECT 60 UNITS INTO THE SKIN AT BEDTIME  . Insulin Pen Needle (B-D ULTRAFINE III SHORT PEN) 31G X 8 MM MISC 1 each by Does not apply route as directed.  . Lancets MISC 1 each by Does not apply route as directed.  Marland Kitchen lisinopril (PRINIVIL,ZESTRIL) 20 MG tablet Take 20 mg by mouth daily.  . metFORMIN (GLUCOPHAGE) 500 MG tablet Take 1 tablet (500 mg total) by mouth 2 (two) times daily with a meal.  . metoprolol tartrate (LOPRESSOR) 25 MG tablet TAKE ONE TABLET BY MOUTH TWICE DAILY  . omeprazole (PRILOSEC) 20 MG capsule Take 20 mg by mouth daily.  . potassium chloride (K-DUR) 10 MEQ tablet TAKE ONE TABLET BY MOUTH ONCE DAILY  . potassium chloride (K-DUR,KLOR-CON) 10 MEQ tablet TAKE ONE TABLET BY MOUTH EVERY DAY  . pravastatin (PRAVACHOL) 40 MG tablet Take 1 tablet by mouth at bedtime.  . tamsulosin (FLOMAX) 0.4  MG CAPS capsule Take 0.4 mg by mouth.  . triamcinolone cream (KENALOG) 0.1 % Apply 1 application topically 2 (two) times daily.  Marland Kitchen warfarin (COUMADIN) 5 MG tablet TAKE 1 TABLET BY MOUTH EVERY DAY THURSDAY-TUESDAY, TAKE 1/2 TABLET ON WEDNESDAY  . [DISCONTINUED] Insulin Glargine (BASAGLAR KWIKPEN) 100 UNIT/ML SOPN INJECT 50 UNITS INTO THE SKIN AT BEDTIME   No facility-administered encounter medications on file as of 09/25/2018.    ALLERGIES: Allergies  Allergen Reactions  . Bactrim [Sulfamethoxazole-Trimethoprim]   . Other     Cannot mix warfarin and bactrim   . [name] Flextouch [Insulin Degludec] Rash   VACCINATION STATUS:  There is no immunization history on file for this patient.  Diabetes  He presents for his follow-up diabetic visit. He has type 2 diabetes mellitus. Onset time: He was diagnosed at approximate age of 77 years. His disease course has been worsening. There are no hypoglycemic associated  symptoms. Pertinent negatives for hypoglycemia include no confusion, headaches, pallor or seizures. There are no diabetic associated symptoms. Pertinent negatives for diabetes include no chest pain, no fatigue, no polydipsia, no polyphagia, no polyuria and no weakness. There are no hypoglycemic complications. Symptoms are improving. There are no diabetic complications. Risk factors for coronary artery disease include hypertension, dyslipidemia and sedentary lifestyle. Current diabetic treatment includes oral agent (dual therapy). He is compliant with treatment most of the time. His weight is increasing steadily. He is following a generally unhealthy diet. His home blood glucose trend is increasing steadily. His breakfast blood glucose range is generally 140-180 mg/dl. His bedtime blood glucose range is generally 140-180 mg/dl. His overall blood glucose range is 140-180 mg/dl. An ACE inhibitor/angiotensin II receptor blocker is being taken.  Hypertension  This is a chronic problem. The current episode started more than 1 year ago. Pertinent negatives include no chest pain, headaches, neck pain, palpitations or shortness of breath. Risk factors for coronary artery disease include diabetes mellitus, dyslipidemia, male gender, sedentary lifestyle and obesity. Past treatments include ACE inhibitors and calcium channel blockers.  Hyperlipidemia  This is a chronic problem. The current episode started more than 1 year ago. Exacerbating diseases include diabetes. Pertinent negatives include no chest pain, myalgias or shortness of breath. Current antihyperlipidemic treatment includes statins. Risk factors for coronary artery disease include dyslipidemia, diabetes mellitus, hypertension, male sex, a sedentary lifestyle and obesity.    Review of Systems  Constitutional: Negative for fatigue and unexpected weight change.  HENT: Negative for dental problem, mouth sores and trouble swallowing.   Eyes: Negative for visual  disturbance.  Respiratory: Negative for cough, choking, chest tightness, shortness of breath and wheezing.   Cardiovascular: Negative for chest pain, palpitations and leg swelling.  Gastrointestinal: Negative for abdominal distention, abdominal pain, constipation, diarrhea, nausea and vomiting.  Endocrine: Negative for polydipsia, polyphagia and polyuria.  Genitourinary: Negative for dysuria, flank pain, hematuria and urgency.  Musculoskeletal: Negative for back pain, gait problem, myalgias and neck pain.  Skin: Negative for pallor, rash and wound.  Neurological: Negative for seizures, syncope, weakness, numbness and headaches.  Psychiatric/Behavioral: Negative for confusion and dysphoric mood.    Objective:    BP 140/85   Pulse 71   Ht 5\' 10"  (1.778 m)   Wt 219 lb (99.3 kg)   BMI 31.42 kg/m   Wt Readings from Last 3 Encounters:  09/25/18 219 lb (99.3 kg)  05/23/18 211 lb (95.7 kg)  03/23/18 211 lb 9.6 oz (96 kg)    Physical Exam  Constitutional: He is  oriented to person, place, and time. He appears well-developed and well-nourished. He is cooperative. No distress.  HENT:  Head: Normocephalic and atraumatic.  Eyes: EOM are normal.  Neck: Normal range of motion. Neck supple. No tracheal deviation present. No thyromegaly present.  Cardiovascular: Normal rate, S1 normal, S2 normal and normal heart sounds. Exam reveals no gallop.  No murmur heard. Pulses:      Dorsalis pedis pulses are 1+ on the right side and 1+ on the left side.       Posterior tibial pulses are 1+ on the right side and 1+ on the left side.  Pulmonary/Chest: Breath sounds normal. No respiratory distress. He has no wheezes.  Abdominal: Soft. Bowel sounds are normal. He exhibits no distension. There is no abdominal tenderness. There is no guarding and no CVA tenderness.  Musculoskeletal:        General: No edema.     Right shoulder: He exhibits no swelling and no deformity.  Neurological: He is alert and oriented  to person, place, and time. He has normal strength and normal reflexes. No cranial nerve deficit or sensory deficit. Gait normal.  Skin: Skin is warm. No rash noted. No cyanosis. Nails show no clubbing.  Psychiatric: His speech is normal. Cognition and memory are normal.    Results for orders placed or performed in visit on 63/84/66  Basic metabolic panel  Result Value Ref Range   BUN 20 4 - 21   Creatinine 1.4 (A) 0.6 - 1.3  Lipid panel  Result Value Ref Range   Triglycerides 204 (A) 40 - 160   Cholesterol 152 0 - 200   HDL 40 35 - 70   LDL Cholesterol 72   Hemoglobin A1c  Result Value Ref Range   Hemoglobin A1C 7.5    Complete Blood Count (Most recent): Lab Results  Component Value Date   WBC 6.6 08/31/2013   HGB 14.0 08/31/2013   HCT 40.1 08/31/2013   MCV 75.9 (L) 08/31/2013   PLT 235 08/31/2013   Chemistry (most recent): Lab Results  Component Value Date   NA 140 08/31/2013   K 4.9 08/31/2013   CL 102 08/31/2013   CO2 29 08/31/2013   BUN 20 09/18/2018   CREATININE 1.4 (A) 09/18/2018   Lipid Panel     Component Value Date/Time   CHOL 152 09/18/2018   TRIG 204 (A) 09/18/2018   HDL 40 09/18/2018   LDLCALC 72 09/18/2018    Assessment & Plan:   1. Type 2 diabetes mellitus with stage 3 renal insufficiency, with long-term current use of insulin ([place])  He  came with higher than target glycemic profile, A1c slightly improving to 7.5% from 7.8%.   He still admits to dietary indiscretion, consumes larger portions, consumes ice cream regularly.   Glucose log records pertaining to this visit,  to be scanned into patient's records.  -Patient remains at a high risk for more acute and chronic complications of diabetes which include CAD, CVA, CKD, retinopathy, and neuropathy. These are all discussed in detail with the patient. -I have re-counseled the patient on diet management and weight loss, by adopting a carbohydrate restricted / protein rich  Diet. Patient is  advised to stick to a routine mealtimes to eat 3 meals  a day and avoid unnecessary snacks ( to snack only to correct hypoglycemia).  -  Suggestion is made for him to avoid simple carbohydrates  from his diet including Cakes, Sweet Desserts / Pastries, Ice Cream, Soda (diet  and regular), Sweet Tea, Candies, Chips, Cookies, Store Bought Juices, Alcohol in Excess of  1-2 drinks a day, Artificial Sweeteners, and "Sugar-free" Products. This will help patient to have stable blood glucose profile and potentially avoid unintended weight gain.  -He is following with Jearld Fenton, CDE for diabetes education.  - He is  willing to continue on basal insulin. - He is getting more comfortable injecting insulin, currently Basaglar.  I discussed and increase his Basaglar to 60 units nightly associated with strict monitoring of blood glucose 2 times a day-daily before breakfast and at bedtime.  -Patient is encouraged to call clinic for blood glucose levels less than 70 or above 150 mg /dl x 3. -He expresses concern of cost of medications including insulin.  -He is advised to continue  metformin  500 mg p.o. twice daily , therapeutically suitable for patient.  - He is generally hesitant and reluctant to add medications. - Patient is not a suitable candidate for incretin therapy  Nor SGLT2i.  - Patient specific target  for A1c; LDL, HDL, Triglycerides, and  Waist Circumference were discussed in detail.  2) BP/HTN: His blood pressure is controlled to target.  He is advised to continue his blood pressure medications including lisinopril 20 mg p.o. daily.   3) Lipids/HPL: His recent lipid panel showed improved LDL of 56.  He is advised to continue pravastatin 40 mg p.o. nightly.    4)  Weight/Diet: He is following with Jearld Fenton, CD for diabetes education.  No success in weight loss,  exercise, and carbohydrates information provided.  5) Chronic Care/Health Maintenance:  -Patient on ACEI and Statin  medications and encouraged to continue to follow up with Ophthalmology, Podiatrist at least yearly or according to recommendations, and advised to stay away from smoking. I have recommended yearly flu vaccine and pneumonia vaccination at least every 5 years; moderate intensity exercise for up to 150 minutes weekly; and  sleep for at least 7 hours a day.  - Time spent with the patient: 25 min, of which >50% was spent in reviewing his blood glucose logs , discussing his hypo- and hyper-glycemic episodes, reviewing his current and  previous labs and insulin doses and developing a plan to avoid hypo- and hyper-glycemia. Please refer to Patient Instructions for Blood Glucose Monitoring and Insulin/Medications Dosing Guide"  in media tab for additional information. Daniel Schaefer participated in the discussions, expressed understanding, and voiced agreement with the above plans.  All questions were answered to his satisfaction. he is encouraged to contact clinic should he have any questions or concerns prior to his return visit.    Follow up plan: Return in about 6 months (around 03/26/2019) for Follow up with Pre-visit Labs, Meter, and Logs.  Glade Lloyd, MD Phone: [phone]  Fax: [phone]  -  This note was partially dictated with voice recognition software. Similar sounding words can be transcribed inadequately or may not  be corrected upon review.  09/25/2018, 1:33 PM

## 2018-09-25 NOTE — Patient Instructions (Addendum)
Goals  Work on portions  Increase lower carb vegetables Increase water intake Start exercising  30 minutes 3-4 times per week.

## 2018-09-25 NOTE — Progress Notes (Signed)
  Medical Nutrition Therapy:  Appt start time: 1100  end time:  1130 Assessment:  Primary concerns today: Diabetes Type 2.    Saw Dr. Dorris Fetch  Taking  50 units of Basaglar and going up to 60 units now.  . Still on Metfmormin 500 mg BID. He notes he is picky eater and doesn't like foods without salt or good flavor.. Eats most meals at home. Admits to portion sizes most of his problem . He is limited on lower carb vegetables due to coumadin therapy.   .A1C 7.5%. Down from 7.8%.    Lab Results  Component Value Date   HGBA1C 7.8 05/16/2018   Lab Results  Component Value Date   CHOL 135 10/14/2017   CHOL 197 06/28/2016   Lab Results  Component Value Date   HDL 39 10/14/2017   HDL 44 06/28/2016   Lab Results  Component Value Date   LDLCALC 56 10/14/2017   LDLCALC 126 06/28/2016   Lab Results  Component Value Date   TRIG 197 (A) 10/14/2017   TRIG 136 06/28/2016   Preferred Learning Style:  No preference indicated   Learning Readiness:  Ready  Change in progress   MEDICATIONS:See list   DIETARY INTAKE:  24-hr recall:  B ( AM): Eggs and oatmeal  or Snk ( AM):  L ( PM): [place] sandwich and some fruit, water Snk ( PM):  D ( PM): Meat and vegetables, water, fruit Snk ( PM): apple  Beverages: water Usual physical activity: ADL   Estimated energy needs: 1800  calories 200  g carbohydrates 135 g protein 50 g fat  Progress Towards Goal(s):  In progress.   Nutritional Diagnosis:  NB-1.1 Food and nutrition-related knowledge deficit As related to Diabets.  As evidenced by A1C 7.6.    Intervention:  Nutrition and Diabetes education provided on My Plate, CHO counting, meal planning, portion sizes, timing of meals, avoiding snacks between meals unless having a low blood sugar, target ranges for A1C and blood sugars, signs/symptoms and treatment of hyper/hypoglycemia, monitoring blood sugars, taking medications as prescribed, benefits of exercising 30 minutes per day and  prevention of complications of DM. Marland Kitchen  Goals  Work on portions  Increase lower carb vegetables Increase water intake Start exercising  30 minutes 3-4 times per week.  Teaching Method Utilized:  Visual Auditory Hands on  Handouts given during visit include:  The Plate Method    Meal Plan Card   Barriers to learning/adherence to lifestyle change:  none  Demonstrated degree of understanding via:  Teach Back   Monitoring/Evaluation:  Dietary intake, exercise, meal planning, and body weight  6 months.Marland Kitchen

## 2018-10-03 ENCOUNTER — Other Ambulatory Visit: Payer: Self-pay | Admitting: Cardiology

## 2018-10-09 ENCOUNTER — Ambulatory Visit (INDEPENDENT_AMBULATORY_CARE_PROVIDER_SITE_OTHER): Payer: Medicare Other | Admitting: Cardiology

## 2018-10-09 ENCOUNTER — Encounter: Payer: Self-pay | Admitting: Cardiology

## 2018-10-09 VITALS — BP 137/83 | HR 66 | Ht 70.0 in | Wt 220.8 lb

## 2018-10-09 DIAGNOSIS — I10 Essential (primary) hypertension: Secondary | ICD-10-CM

## 2018-10-09 DIAGNOSIS — I4821 Permanent atrial fibrillation: Secondary | ICD-10-CM | POA: Diagnosis not present

## 2018-10-09 NOTE — Progress Notes (Signed)
Cardiology Office Note  Date: 10/09/2018   ID: [name], [name] 1951/03/16, MRN [id number]  PCP: Daniel Mealy, MD  Primary Cardiologist: Daniel Lesches, MD   Chief Complaint  Patient presents with  . Atrial Fibrillation    History of Present Illness: Daniel Schaefer is a 68 y.o. male last seen in July 2019.  He is here for a follow-up visit.  He reports no palpitations or chest pain.  Continues to follow with endocrinology for management of diabetes mellitus.  He has not been walking as much, we discussed an exercise plan and also weight loss through diet.  He is on Coumadin with follow-up in the anticoagulation clinic.  He does not report any spontaneous bleeding episodes.  Last INR was 2.3.  I personally reviewed his ECG today which shows rate controlled atrial fibrillation with low voltage.  He remains on stable dose of Lopressor.  Past Medical History:  Diagnosis Date  . Atrial fibrillation Daniel Schaefer)    Reportedly diagnosed November 2011  . Essential hypertension, benign   . Prostate cancer [facility])    Radiation implants  . Type 2 diabetes mellitus (Daniel Schaefer)     Past Surgical History:  Procedure Laterality Date  . Gold seed placement    . PROSTATE BIOPSY      Current Outpatient Medications  Medication Sig Dispense Refill  . amLODipine (NORVASC) 5 MG tablet Take 1 tablet by mouth daily.    . cetirizine (ZYRTEC) 10 MG tablet Take 10 mg by mouth daily.    . furosemide (LASIX) 20 MG tablet TAKE 1 TABLET BY MOUTH DAILY 90 tablet 1  . GLOBAL EASE INJECT PEN NEEDLES 31G X 8 MM MISC USE 1 DAILY AS DIRECTED - RUN ON CASH $15 - PER PC. 100 each 5  . glucose blood test strip Use as instructed 100 each 12  . Insulin Glargine (BASAGLAR KWIKPEN) 100 UNIT/ML SOPN INJECT 60 UNITS INTO THE SKIN AT BEDTIME 10 pen 2  . Insulin Pen Needle (B-D ULTRAFINE III SHORT PEN) 31G X 8 MM MISC 1 each by Does not apply route as directed. 50 each 2  . Lancets MISC 1 each by Does not apply  route as directed. 100 each 3  . lisinopril (PRINIVIL,ZESTRIL) 20 MG tablet Take 20 mg by mouth daily.    . metFORMIN (GLUCOPHAGE) 500 MG tablet Take 1 tablet (500 mg total) by mouth 2 (two) times daily with a meal. 180 tablet 1  . metoprolol tartrate (LOPRESSOR) 25 MG tablet TAKE ONE TABLET BY MOUTH TWICE DAILY 60 tablet 6  . omeprazole (PRILOSEC) 20 MG capsule Take 20 mg by mouth daily.    . potassium chloride (K-DUR) 10 MEQ tablet TAKE ONE TABLET BY MOUTH ONCE DAILY 90 tablet 3  . pravastatin (PRAVACHOL) 40 MG tablet Take 1 tablet by mouth at bedtime.    . tamsulosin (FLOMAX) 0.4 MG CAPS capsule Take 0.4 mg by mouth.    . triamcinolone cream (KENALOG) 0.1 % Apply 1 application topically 2 (two) times daily.    Marland Kitchen warfarin (COUMADIN) 5 MG tablet TAKE 1 TABLET BY MOUTH EVERY DAY THURSDAY-TUESDAY, TAKE 1/2 TABLET ON WEDNESDAY 35 tablet 6   No current facility-administered medications for this visit.    Allergies:  Bactrim [sulfamethoxazole-trimethoprim]; Other; and Tresiba flextouch [insulin degludec]   Social History: The patient  reports that he has never smoked. He has never used smokeless tobacco. He reports that he does not drink alcohol or use drugs.  ROS:  Please see the history of present illness. Otherwise, complete review of systems is positive for none.  All other systems are reviewed and negative.   Physical Exam: VS:  BP 137/83   Pulse 66   Ht 5\' 10"  (1.778 m)   Wt 220 lb 12.8 oz (100.2 kg)   SpO2 98%   BMI 31.68 kg/m , BMI Body mass index is 31.68 kg/m.  Wt Readings from Last 3 Encounters:  10/09/18 220 lb 12.8 oz (100.2 kg)  09/25/18 219 lb (99.3 kg)  05/23/18 211 lb (95.7 kg)    General: Patient appears comfortable at rest. HEENT: Conjunctiva and lids normal, oropharynx clear. Neck: Supple, no elevated JVP or carotid bruits, no thyromegaly. Lungs: Clear to auscultation, nonlabored breathing at rest. Cardiac: Irregularly irregular, no S3 or significant systolic  murmur. Abdomen: Soft, nontender, bowel sounds present. Extremities: No pitting edema, distal pulses 2+.  ECG: I personally reviewed the tracing from 08/11/2017 which showed rate controlled atrial fibrillation with nonspecific ST changes and decreased R wave progression.  Recent Labwork: 09/18/2018: BUN 20; Creatinine 1.4     Component Value Date/Time   CHOL 152 09/18/2018   TRIG 204 (A) 09/18/2018   HDL 40 09/18/2018   Daniel Schaefer 72 09/18/2018    Other Studies Reviewed Today:  Echocardiogram 04/04/2013: Study Conclusions  - Left ventricle: The cavity size was normal. Wall thickness was increased in a pattern of mild LVH. Systolic function was normal. The estimated ejection fraction was in the range of 50% to 55%. Wall motion was normal; there were no regional wall motion abnormalities. The study is not technically sufficient to allow evaluation of LV diastolic function, due to underlying atrial fibrillation. - Left atrium: The atrium was mildly dilated. - Pulmonic valve: Mild regurgitation.  Assessment and Plan:  1.  Permanent atrial fibrillation.  CHADSVASC score is 3. Continue Coumadin for stroke prophylaxis and Lopressor for heart rate control.  2.  Essential hypertension, continues on Norvasc and lisinopril.  Current medicines were reviewed with the patient today.   Orders Placed This Encounter  Procedures  . EKG 12-Lead    Disposition: Follow-up in 6 months.  Signed, Satira Sark, MD, [facility] 10/09/2018 2:01 PM    [place] [facility] [place], [place], [place] [address] Phone: [phone]; Fax: [phone]

## 2018-10-09 NOTE — Patient Instructions (Addendum)

## 2018-10-12 ENCOUNTER — Ambulatory Visit (INDEPENDENT_AMBULATORY_CARE_PROVIDER_SITE_OTHER): Payer: Medicare Other | Admitting: Pharmacist

## 2018-10-12 DIAGNOSIS — I4891 Unspecified atrial fibrillation: Secondary | ICD-10-CM

## 2018-10-12 DIAGNOSIS — Z5181 Encounter for therapeutic drug level monitoring: Secondary | ICD-10-CM | POA: Diagnosis not present

## 2018-10-12 LAB — POCT INR: INR: 2.3 (ref 2.0–3.0)

## 2018-10-12 NOTE — Patient Instructions (Signed)
Description   Continue coumadin 1 tablet daily except 1/2 tablet on Wednesdays Continue greens Recheck in 6 weeks     

## 2018-10-14 ENCOUNTER — Other Ambulatory Visit: Payer: Self-pay | Admitting: "Endocrinology

## 2018-11-24 ENCOUNTER — Telehealth: Payer: Self-pay | Admitting: Cardiology

## 2018-11-24 NOTE — Telephone Encounter (Signed)
Patient has been told to start taking Levaquin 500mg  10 days  He needs to know what he needs to do about warfin dosage.

## 2018-11-24 NOTE — Telephone Encounter (Signed)
Spoke with patient and he will start Levaquin today. Advised to remain on same dose and moved appt to Tuesday to check INR on therapy.

## 2018-11-24 NOTE — Telephone Encounter (Signed)
LMOM

## 2018-11-28 ENCOUNTER — Other Ambulatory Visit: Payer: Self-pay

## 2018-11-28 ENCOUNTER — Ambulatory Visit (INDEPENDENT_AMBULATORY_CARE_PROVIDER_SITE_OTHER): Payer: Medicare Other | Admitting: *Deleted

## 2018-11-28 DIAGNOSIS — I4891 Unspecified atrial fibrillation: Secondary | ICD-10-CM

## 2018-11-28 DIAGNOSIS — Z5181 Encounter for therapeutic drug level monitoring: Secondary | ICD-10-CM | POA: Diagnosis not present

## 2018-11-28 LAB — POCT INR: INR: 2.5 (ref 2.0–3.0)

## 2018-11-28 NOTE — Patient Instructions (Signed)
Continue coumadin 1 tablet daily except 1/2 tablet on Wednesdays Continue greens Recheck in 4 weeks

## 2018-12-13 ENCOUNTER — Other Ambulatory Visit: Payer: Self-pay | Admitting: Cardiology

## 2018-12-13 MED ORDER — POTASSIUM CHLORIDE ER 10 MEQ PO TBCR: 10.0000 meq | EXTENDED_RELEASE_TABLET | Freq: Every day | ORAL | 1 refills | Status: DC

## 2018-12-13 NOTE — Telephone Encounter (Signed)
°*  STAT* If patient is at the pharmacy, call can be transferred to refill team.   1. Which medications need to be refilled? potassium chloride (K-DUR) 10 MEQ tablet    2. Which pharmacy/location (including street and city if local pharmacy) is medication to be sent to? [facility]  3. Do they need a 30 day or 90 day supply? [place]

## 2018-12-13 NOTE — Telephone Encounter (Signed)
Medication sent to pharmacy  

## 2018-12-25 ENCOUNTER — Telehealth: Payer: Self-pay | Admitting: *Deleted

## 2018-12-25 NOTE — Telephone Encounter (Signed)
° °  COVID-19 Pre-Screening Questions: ° °• Do you currently have a fever?NO ° ° °• Have you recently travelled on a cruise, internationally, or to [place], [place], [place], [place], [place], or [place], [place] ([employer]) ? NO °•  °• Have you been in contact with someone that is currently pending confirmation of Covid19 testing or has been confirmed to have the Covid19 virus?  NO °•  °Are you currently experiencing fatigue or cough? NO ° ° °   ° ° ° ° °

## 2018-12-26 ENCOUNTER — Other Ambulatory Visit: Payer: Self-pay

## 2018-12-26 ENCOUNTER — Ambulatory Visit (INDEPENDENT_AMBULATORY_CARE_PROVIDER_SITE_OTHER): Payer: Medicare Other | Admitting: *Deleted

## 2018-12-26 DIAGNOSIS — Z5181 Encounter for therapeutic drug level monitoring: Secondary | ICD-10-CM | POA: Diagnosis not present

## 2018-12-26 DIAGNOSIS — I4891 Unspecified atrial fibrillation: Secondary | ICD-10-CM | POA: Diagnosis not present

## 2018-12-26 LAB — POCT INR: INR: 2.5 (ref 2.0–3.0)

## 2018-12-26 NOTE — Patient Instructions (Signed)
Continue coumadin 1 tablet daily except 1/2 tablet on Wednesdays Continue greens Recheck in 6 weeks

## 2019-01-11 IMAGING — DX DG ABDOMEN 1V
2 series · 2 of 2 positions shown · non-contrast
Comparison: 12/31/2016 and prior radiographs.  02/04/2016 CT

CLINICAL DATA: Follow-up urinary calculi.

EXAM:
ABDOMEN - 1 VIEW

[abdomen kub (1 of 2)]
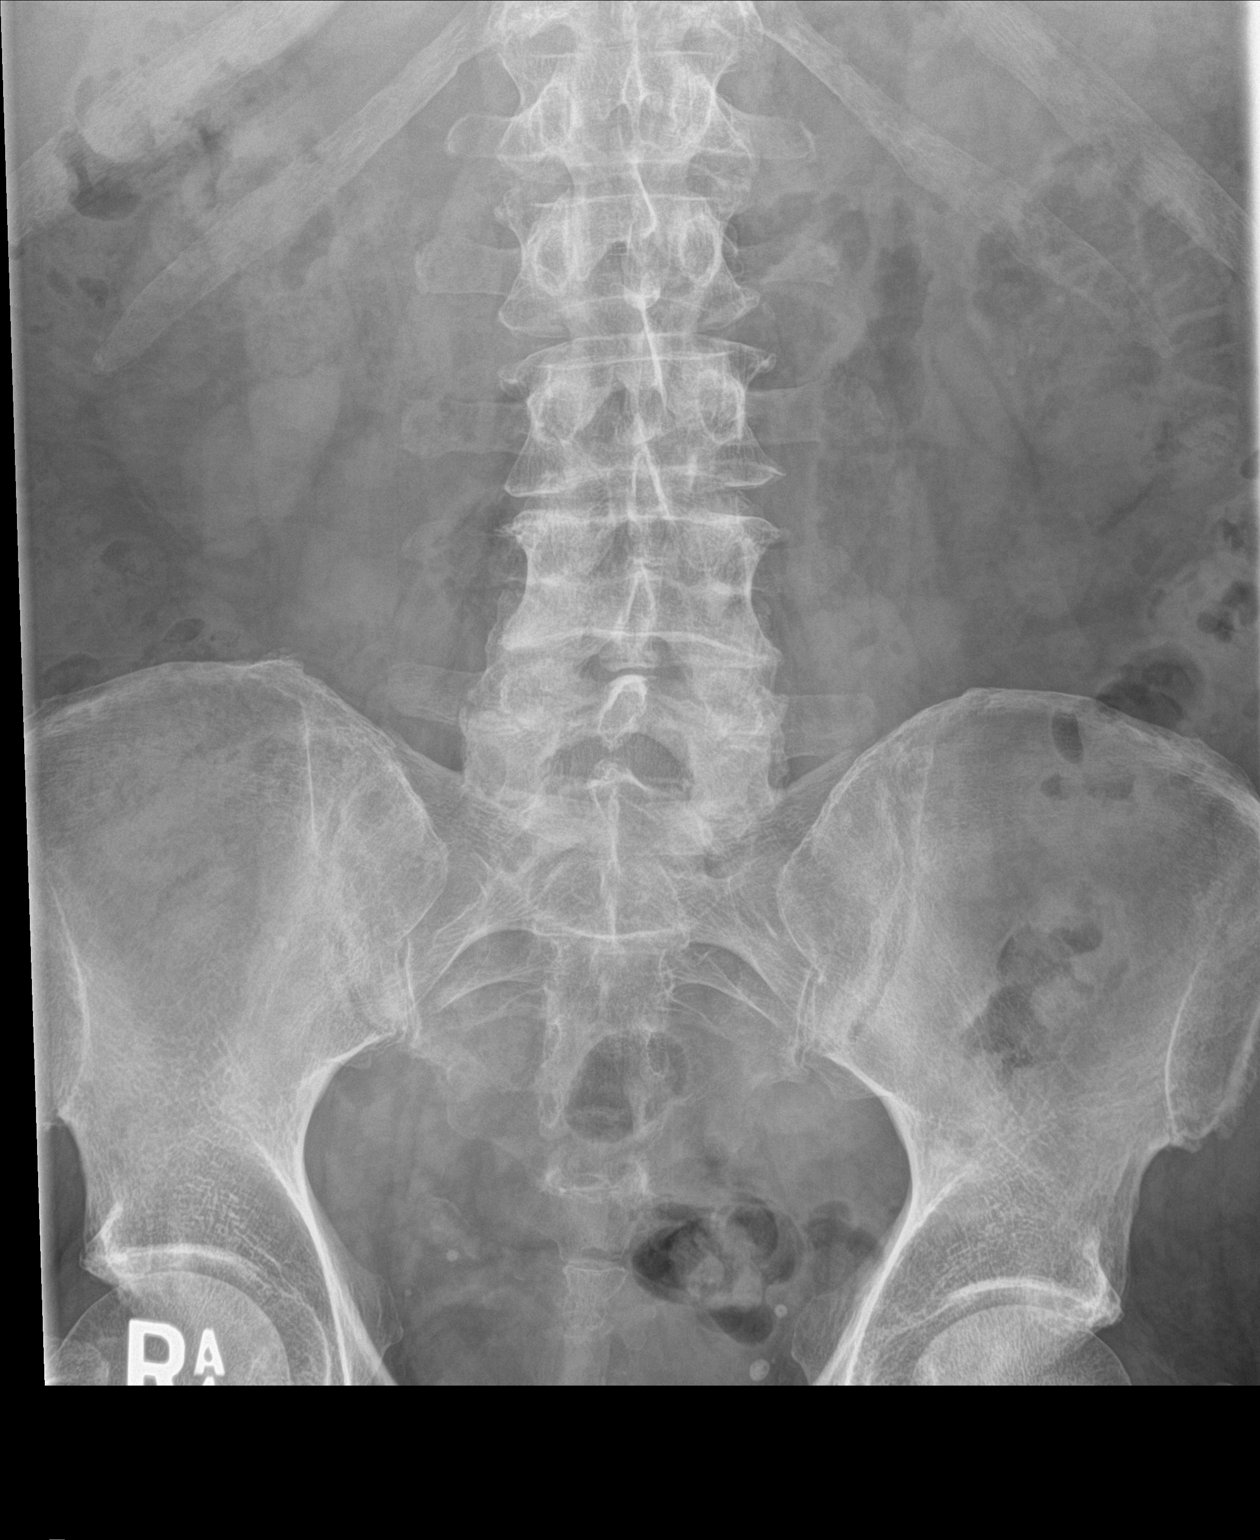

[abdomen kub (2 of 2)]
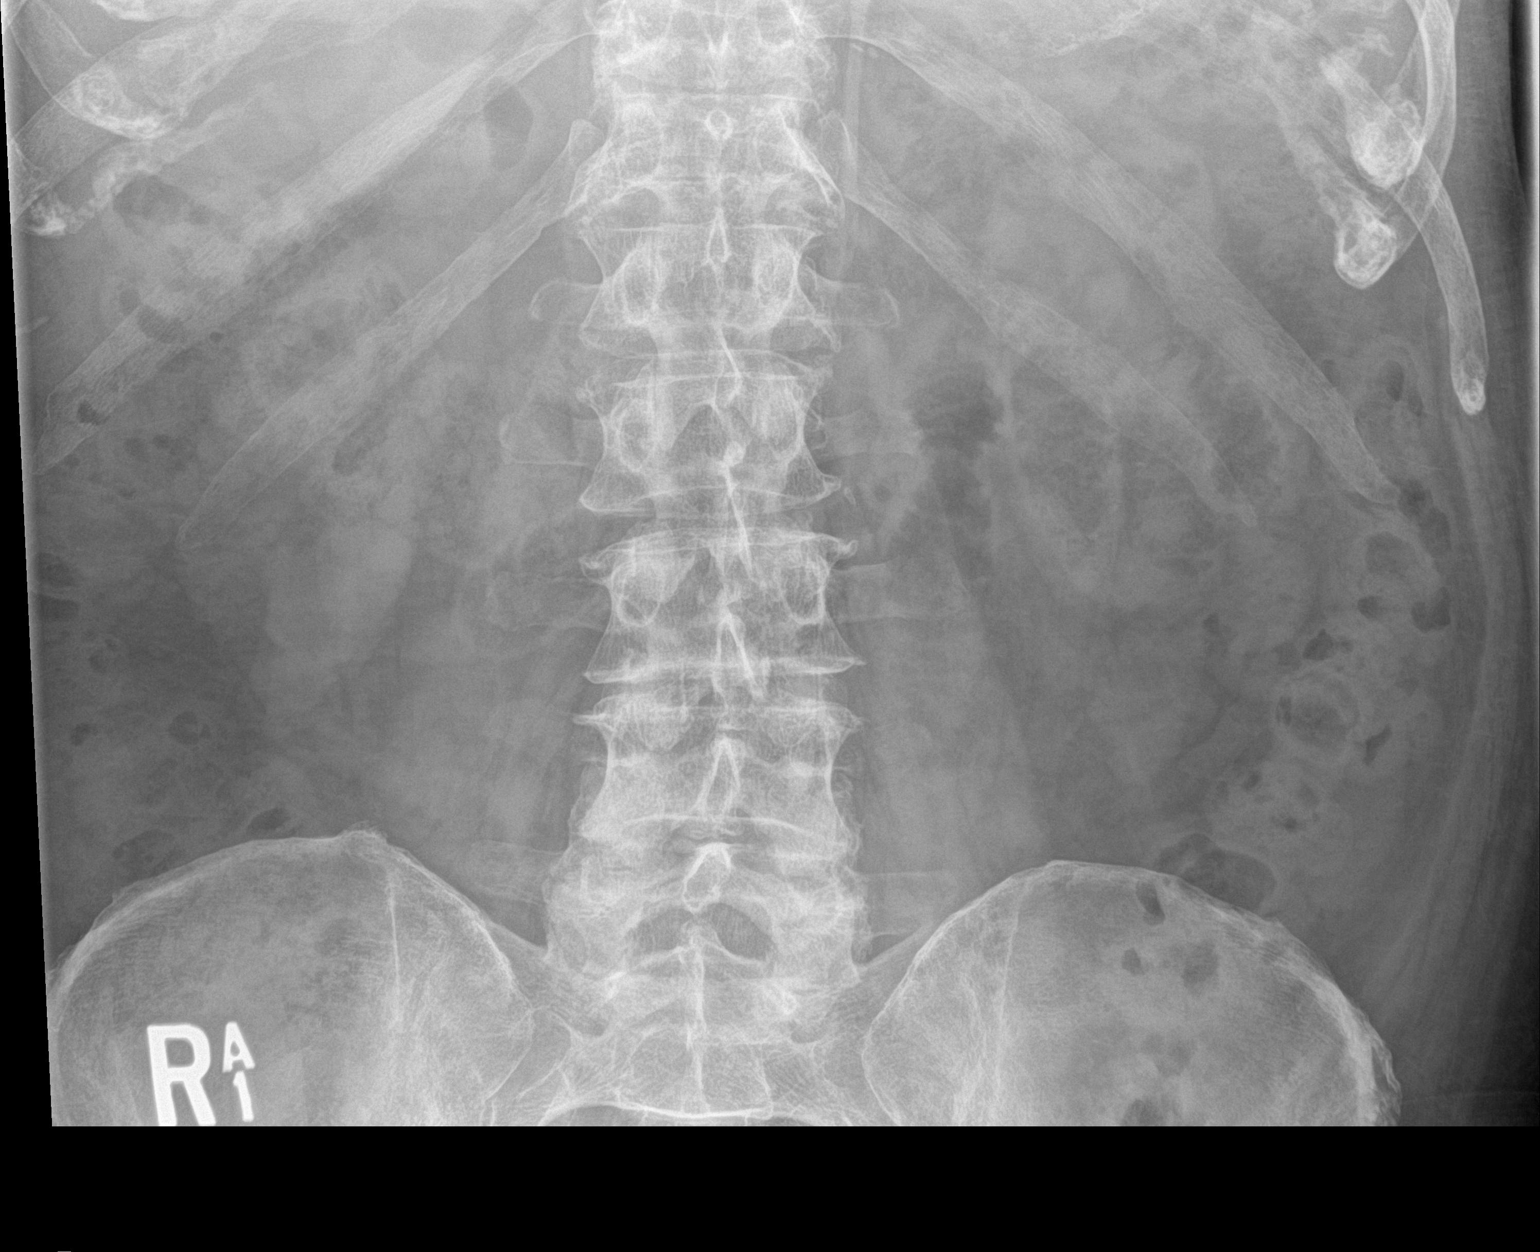

[2 of 2 positions shown; findings below may reference images not displayed]

FINDINGS: Three separate 2 mm calcifications overlying the mid LEFT renal
shadow likely represent nonobstructing renal calculi.

No other calcifications are identified overlying the renal shadows
or expected course of the ureters.

Prostate seeds are present.

The bowel gas pattern is unremarkable.

No acute bony abnormalities are identified.
IMPRESSION: LEFT renal calculi.  No other radiopaque urinary calculi identified.

## 2019-02-06 ENCOUNTER — Other Ambulatory Visit: Payer: Self-pay

## 2019-02-06 ENCOUNTER — Ambulatory Visit (INDEPENDENT_AMBULATORY_CARE_PROVIDER_SITE_OTHER): Payer: Medicare Other | Admitting: *Deleted

## 2019-02-06 DIAGNOSIS — Z5181 Encounter for therapeutic drug level monitoring: Secondary | ICD-10-CM

## 2019-02-06 DIAGNOSIS — I4891 Unspecified atrial fibrillation: Secondary | ICD-10-CM | POA: Diagnosis not present

## 2019-02-06 LAB — POCT INR: INR: 2.1 (ref 2.0–3.0)

## 2019-02-06 NOTE — Patient Instructions (Signed)
Continue coumadin 1 tablet daily except 1/2 tablet on Wednesdays Continue greens Recheck in 6 weeks

## 2019-02-12 ENCOUNTER — Other Ambulatory Visit: Payer: Self-pay | Admitting: Cardiology

## 2019-03-12 ENCOUNTER — Other Ambulatory Visit: Payer: Self-pay | Admitting: Cardiology

## 2019-03-15 ENCOUNTER — Other Ambulatory Visit (HOSPITAL_COMMUNITY)
Admission: RE | Admit: 2019-03-15 | Discharge: 2019-03-15 | Disposition: A | Discharge status: Home / Self Care | Payer: Medicare Other | Source: Ambulatory Visit | Attending: Urology | Admitting: Urology

## 2019-03-15 ENCOUNTER — Other Ambulatory Visit (HOSPITAL_COMMUNITY): Payer: Self-pay | Admitting: Urology

## 2019-03-15 ENCOUNTER — Other Ambulatory Visit: Payer: Self-pay

## 2019-03-15 ENCOUNTER — Ambulatory Visit (HOSPITAL_COMMUNITY)
Admission: RE | Admit: 2019-03-15 | Discharge: 2019-03-15 | Disposition: A | Discharge status: Home / Self Care | Payer: Medicare Other | Source: Ambulatory Visit | Attending: Urology | Admitting: Urology

## 2019-03-15 DIAGNOSIS — N200 Calculus of kidney: Secondary | ICD-10-CM

## 2019-03-15 DIAGNOSIS — C61 Malignant neoplasm of prostate: Secondary | ICD-10-CM | POA: Insufficient documentation

## 2019-03-15 LAB — PSA: Prostatic Specific Antigen: 0.18 ng/mL (ref 0.00–4.00)

## 2019-03-20 ENCOUNTER — Ambulatory Visit (INDEPENDENT_AMBULATORY_CARE_PROVIDER_SITE_OTHER): Payer: Medicare Other | Admitting: *Deleted

## 2019-03-20 DIAGNOSIS — I4891 Unspecified atrial fibrillation: Secondary | ICD-10-CM

## 2019-03-20 DIAGNOSIS — Z5181 Encounter for therapeutic drug level monitoring: Secondary | ICD-10-CM

## 2019-03-20 LAB — POCT INR: INR: 3 (ref 2.0–3.0)

## 2019-03-20 NOTE — Patient Instructions (Signed)
Continue coumadin 1 tablet daily except 1/2 tablet on Wednesdays Continue greens Recheck in 6 weeks

## 2019-03-21 ENCOUNTER — Other Ambulatory Visit: Payer: Self-pay | Admitting: "Endocrinology

## 2019-03-22 LAB — BASIC METABOLIC PANEL
BUN: 20 (ref 4–21)
Creatinine: 1.4 — AB (ref 0.6–1.3)

## 2019-03-22 LAB — LIPID PANEL
Cholesterol: 146 (ref 0–200)
HDL: 42 (ref 35–70)
LDL Cholesterol: 81
Triglycerides: 115 (ref 40–160)

## 2019-03-22 LAB — HEMOGLOBIN A1C: Hemoglobin A1C: 6.5

## 2019-03-22 LAB — VITAMIN D 25 HYDROXY (VIT D DEFICIENCY, FRACTURES): Vit D, 25-Hydroxy: 59.7

## 2019-03-23 ENCOUNTER — Ambulatory Visit (INDEPENDENT_AMBULATORY_CARE_PROVIDER_SITE_OTHER): Payer: Medicare Other | Admitting: Urology

## 2019-03-23 ENCOUNTER — Other Ambulatory Visit: Payer: Self-pay

## 2019-03-23 DIAGNOSIS — N5201 Erectile dysfunction due to arterial insufficiency: Secondary | ICD-10-CM

## 2019-03-23 DIAGNOSIS — Z8546 Personal history of malignant neoplasm of prostate: Secondary | ICD-10-CM | POA: Diagnosis not present

## 2019-03-23 DIAGNOSIS — K8080 Other cholelithiasis without obstruction: Secondary | ICD-10-CM

## 2019-03-23 DIAGNOSIS — N200 Calculus of kidney: Secondary | ICD-10-CM

## 2019-03-29 ENCOUNTER — Ambulatory Visit: Payer: Medicare Other | Admitting: Nutrition

## 2019-03-29 ENCOUNTER — Other Ambulatory Visit: Payer: Self-pay

## 2019-03-29 ENCOUNTER — Encounter: Payer: Self-pay | Admitting: "Endocrinology

## 2019-03-29 ENCOUNTER — Ambulatory Visit (INDEPENDENT_AMBULATORY_CARE_PROVIDER_SITE_OTHER): Payer: Medicare Other | Admitting: "Endocrinology

## 2019-03-29 VITALS — BP 132/84 | HR 75 | Temp 98.1°F | Ht 70.0 in | Wt 228.0 lb

## 2019-03-29 DIAGNOSIS — E782 Mixed hyperlipidemia: Secondary | ICD-10-CM | POA: Diagnosis not present

## 2019-03-29 DIAGNOSIS — I10 Essential (primary) hypertension: Secondary | ICD-10-CM

## 2019-03-29 DIAGNOSIS — Z794 Long term (current) use of insulin: Secondary | ICD-10-CM | POA: Diagnosis not present

## 2019-03-29 DIAGNOSIS — E1159 Type 2 diabetes mellitus with other circulatory complications: Secondary | ICD-10-CM

## 2019-03-29 NOTE — Patient Instructions (Signed)

## 2019-03-29 NOTE — Progress Notes (Signed)
03/29/2019  Endocrinology follow-up note  Subjective:    Patient ID: Daniel Schaefer, male    DOB: [birth year],    Past Medical History:  Diagnosis Date  . Atrial fibrillation [facility])    Reportedly diagnosed November 2011  . Essential hypertension, benign   . Prostate cancer [facility])    Radiation implants  . Type 2 diabetes mellitus ([place])    Past Surgical History:  Procedure Laterality Date  . Gold seed placement    . PROSTATE BIOPSY     Social History   Socioeconomic History  . Marital status: Divorced    Spouse name: Not on file  . Number of children: Not on file  . Years of education: Not on file  . Highest education level: Not on file  Occupational History  . Not on file  Social Needs  . Financial resource strain: Not on file  . Food insecurity    Worry: Not on file    Inability: Not on file  . Transportation needs    Medical: Not on file    Non-medical: Not on file  Tobacco Use  . Smoking status: Never Smoker  . Smokeless tobacco: Never Used  Substance and Sexual Activity  . Alcohol use: No    Alcohol/week: 0.0 standard drinks    Frequency: Never  . Drug use: No  . Sexual activity: Not on file  Lifestyle  . Physical activity    Days per week: Not on file    Minutes per session: Not on file  . Stress: Not on file  Relationships  . Social Herbalist on phone: Not on file    Gets together: Not on file    Attends religious service: Not on file    Active member of club or organization: Not on file    Attends meetings of clubs or organizations: Not on file    Relationship status: Not on file  Other Topics Concern  . Not on file  Social History Narrative  . Not on file   Outpatient Encounter Medications as of 03/29/2019  Medication Sig  . amLODipine (NORVASC) 5 MG tablet Take 1 tablet by mouth daily.  . cetirizine (ZYRTEC) 10 MG tablet Take 10 mg by mouth daily.  . furosemide (LASIX) 20 MG tablet TAKE 1 TABLET BY MOUTH DAILY  . GLOBAL EASE  INJECT PEN NEEDLES 31G X 8 MM MISC USE 1 DAILY AS DIRECTED - RUN ON CASH $15 - PER PC.  Marland Kitchen glucose blood test strip Use as instructed  . Insulin Glargine (BASAGLAR KWIKPEN) 100 UNIT/ML SOPN INJECT 60 UNITS INTO THE SKIN AT BEDTIME  . Insulin Pen Needle (B-D ULTRAFINE III SHORT PEN) 31G X 8 MM MISC 1 each by Does not apply route as directed.  . Lancets MISC 1 each by Does not apply route as directed.  Marland Kitchen lisinopril (PRINIVIL,ZESTRIL) 20 MG tablet Take 20 mg by mouth daily.  . metFORMIN (GLUCOPHAGE) 500 MG tablet TAKE 1 TABLET BY MOUTH TWICE DAILY WITH A MEAL  . metoprolol tartrate (LOPRESSOR) 25 MG tablet TAKE ONE TABLET BY MOUTH TWICE DAILY  . omeprazole (PRILOSEC) 20 MG capsule Take 20 mg by mouth daily.  . potassium chloride (K-DUR) 10 MEQ tablet Take 1 tablet (10 mEq total) by mouth daily.  . pravastatin (PRAVACHOL) 40 MG tablet Take 1 tablet by mouth at bedtime.  . tamsulosin (FLOMAX) 0.4 MG CAPS capsule Take 0.4 mg by mouth.  . triamcinolone cream (KENALOG) 0.1 % Apply 1 application  topically 2 (two) times daily.  Marland Kitchen warfarin (COUMADIN) 5 MG tablet TAKE 1 TABLET BY MOUTH EVERY DAY THURSDAY-TUESDAY, TAKE 1/2 TABLET ON WEDNESDAY   No facility-administered encounter medications on file as of 03/29/2019.    ALLERGIES: Allergies  Allergen Reactions  . Bactrim [Sulfamethoxazole-Trimethoprim]   . Other     Cannot mix warfarin and bactrim   . [name] Flextouch [Insulin Degludec] Rash   VACCINATION STATUS:  There is no immunization history on file for this patient.  Diabetes He presents for his follow-up diabetic visit. He has type 2 diabetes mellitus. Onset time: He was diagnosed at approximate age of 84 years. His disease course has been worsening. There are no hypoglycemic associated symptoms. Pertinent negatives for hypoglycemia include no confusion, headaches, pallor or seizures. There are no diabetic associated symptoms. Pertinent negatives for diabetes include no chest pain, no fatigue,  no polydipsia, no polyphagia, no polyuria and no weakness. There are no hypoglycemic complications. Symptoms are improving. There are no diabetic complications. Risk factors for coronary artery disease include hypertension, dyslipidemia and sedentary lifestyle. Current diabetic treatment includes oral agent (dual therapy). He is compliant with treatment most of the time. His weight is increasing steadily. He is following a generally unhealthy diet. His home blood glucose trend is increasing steadily. His breakfast blood glucose range is generally 140-180 mg/dl. His bedtime blood glucose range is generally 140-180 mg/dl. His overall blood glucose range is 140-180 mg/dl. An ACE inhibitor/angiotensin II receptor blocker is being taken.  Hypertension This is a chronic problem. The current episode started more than 1 year ago. Pertinent negatives include no chest pain, headaches, neck pain, palpitations or shortness of breath. Risk factors for coronary artery disease include diabetes mellitus, dyslipidemia, male gender, sedentary lifestyle and obesity. Past treatments include ACE inhibitors and calcium channel blockers.  Hyperlipidemia This is a chronic problem. The current episode started more than 1 year ago. Exacerbating diseases include diabetes. Pertinent negatives include no chest pain, myalgias or shortness of breath. Current antihyperlipidemic treatment includes statins. Risk factors for coronary artery disease include dyslipidemia, diabetes mellitus, hypertension, male sex, a sedentary lifestyle and obesity.    Review of Systems  Constitutional: Negative for fatigue and unexpected weight change.  HENT: Negative for dental problem, mouth sores and trouble swallowing.   Eyes: Negative for visual disturbance.  Respiratory: Negative for cough, choking, chest tightness, shortness of breath and wheezing.   Cardiovascular: Negative for chest pain, palpitations and leg swelling.  Gastrointestinal: Negative  for abdominal distention, abdominal pain, constipation, diarrhea, nausea and vomiting.  Endocrine: Negative for polydipsia, polyphagia and polyuria.  Genitourinary: Negative for dysuria, flank pain, hematuria and urgency.  Musculoskeletal: Negative for back pain, gait problem, myalgias and neck pain.  Skin: Negative for pallor, rash and wound.  Neurological: Negative for seizures, syncope, weakness, numbness and headaches.  Psychiatric/Behavioral: Negative for confusion and dysphoric mood.    Objective:    BP 132/84 (BP Location: Right Arm, Patient Position: Sitting, Cuff Size: Normal)   Pulse 75   Temp 98.1 F (36.7 C) (Oral)   Ht 5\' 10"  (1.778 m)   Wt 228 lb (103.4 kg)   SpO2 97%   BMI 32.71 kg/m   Wt Readings from Last 3 Encounters:  03/29/19 228 lb 6.3 oz (103.6 kg)  03/29/19 228 lb (103.4 kg)  10/09/18 220 lb 12.8 oz (100.2 kg)    Physical Exam  Constitutional: He is oriented to person, place, and time. He appears well-developed and well-nourished. He is cooperative. No  distress.  HENT:  Head: Normocephalic and atraumatic.  Eyes: EOM are normal.  Neck: Normal range of motion. Neck supple. No tracheal deviation present. No thyromegaly present.  Cardiovascular: Normal rate, S1 normal, S2 normal and normal heart sounds. Exam reveals no gallop.  No murmur heard. Pulses:      Dorsalis pedis pulses are 1+ on the right side and 1+ on the left side.       Posterior tibial pulses are 1+ on the right side and 1+ on the left side.  Pulmonary/Chest: Breath sounds normal. No respiratory distress. He has no wheezes.  Abdominal: Soft. Bowel sounds are normal. He exhibits no distension. There is no abdominal tenderness. There is no guarding and no CVA tenderness.  Musculoskeletal:        General: No edema.     Right shoulder: He exhibits no swelling and no deformity.  Neurological: He is alert and oriented to person, place, and time. He has normal strength and normal reflexes. No  cranial nerve deficit or sensory deficit. Gait normal.  Skin: Skin is warm. No rash noted. No cyanosis. Nails show no clubbing.  Psychiatric: His speech is normal. Cognition and memory are normal.    Results for orders placed or performed in visit on 03/29/19  VITAMIN D 25 Hydroxy (Vit-D Deficiency, Fractures)  Result Value Ref Range   Vit D, 25-Hydroxy 74.0   Basic metabolic panel  Result Value Ref Range   BUN 20 4 - 21   Creatinine 1.4 (A) 0.6 - 1.3  Lipid panel  Result Value Ref Range   Triglycerides 115 40 - 160   Cholesterol 146 0 - 200   HDL 42 35 - 70   LDL Cholesterol 81   Hemoglobin A1c  Result Value Ref Range   Hemoglobin A1C 6.5    Complete Blood Count (Most recent): Lab Results  Component Value Date   WBC 6.6 08/31/2013   HGB 14.0 08/31/2013   HCT 40.1 08/31/2013   MCV 75.9 (L) 08/31/2013   PLT 235 08/31/2013   Chemistry (most recent): Lab Results  Component Value Date   NA 140 08/31/2013   K 4.9 08/31/2013   CL 102 08/31/2013   CO2 29 08/31/2013   BUN 20 03/22/2019   CREATININE 1.4 (A) 03/22/2019   Lipid Panel     Component Value Date/Time   CHOL 146 03/22/2019   TRIG 115 03/22/2019   HDL 42 03/22/2019   LDLCALC 81 03/22/2019    Assessment & Plan:   1. Type 2 diabetes mellitus with stage 3 renal insufficiency, with long-term current use of insulin ([place])  He  came with significantly improved glycemic profile both fasting and postprandial.  His previsit labs show A1c of 6.5%, improving from 7.8%.      Glucose log records pertaining to this visit,  to be scanned into patient's records.  -Patient remains at a high risk for more acute and chronic complications of diabetes which include CAD, CVA, CKD, retinopathy, and neuropathy. These are all discussed in detail with the patient. -I have re-counseled the patient on diet management and weight loss, by adopting a carbohydrate restricted / protein rich  Diet. Patient is advised to stick to a routine  mealtimes to eat 3 meals  a day and avoid unnecessary snacks ( to snack only to correct hypoglycemia).  - he  admits there is a room for improvement in his diet and drink choices, consumes ice cream regularly. -  Suggestion is made for him  to avoid simple carbohydrates  from his diet including Cakes, Sweet Desserts / Pastries, Ice Cream, Soda (diet and regular), Sweet Tea, Candies, Chips, Cookies, Sweet Pastries,  Store Bought Juices, Alcohol in Excess of  1-2 drinks a day, Artificial Sweeteners, Coffee Creamer, and "Sugar-free" Products. This will help patient to have stable blood glucose profile and potentially avoid unintended weight gain.   -He is following with Jearld Fenton, CDE for diabetes education.  - He is  willing to continue on basal insulin. - He is getting more comfortable injecting insulin, currently Basaglar.  He is advised to continue Basaglar  60 units nightly associated with strict monitoring of blood glucose 2 times a day-daily before breakfast and at bedtime.  -Patient is encouraged to call clinic for blood glucose levels less than 70 or above 150 mg /dl x 3. -He expresses concern of cost of medications including insulin.  -He is advised to continue  metformin  500 mg p.o. twice daily , therapeutically suitable for patient.  - He is generally hesitant and reluctant to add medications. - Patient is not a suitable candidate for incretin therapy  Nor SGLT2i.  - Patient specific target  for A1c; LDL, HDL, Triglycerides, and  Waist Circumference were discussed in detail.  2) BP/HTN: His blood pressure is controlled to target.   He is advised to continue his blood pressure medications including lisinopril 20 mg p.o. daily.   3) Lipids/HPL: His recent lipid panel showed elevated LDL of 81 from 56.  He is advised to be consistent with his pravastatin 40 mg p.o. daily at bedtime.   4)  Weight/Diet: He is following with Jearld Fenton, CD for diabetes education.  No success in  weight loss,  exercise, and carbohydrates information provided.  5) Chronic Care/Health Maintenance:  -Patient on ACEI and Statin medications and encouraged to continue to follow up with Ophthalmology, Podiatrist at least yearly or according to recommendations, and advised to stay away from smoking. I have recommended yearly flu vaccine and pneumonia vaccination at least every 5 years; moderate intensity exercise for up to 150 minutes weekly; and  sleep for at least 7 hours a day.  - Time spent with the patient: 25 min, of which >50% was spent in reviewing his  current and  previous labs/studies, previous treatments, and medications doses and developing a plan for long-term care based on the latest recommendations for standards of care. Please refer to " Patient Self Inventory" in the Media  tab for reviewed elements of pertinent patient history. Daniel Schaefer participated in the discussions, expressed understanding, and voiced agreement with the above plans.  All questions were answered to his satisfaction. he is encouraged to contact clinic should he have any questions or concerns prior to his return visit.   Follow up plan: Return in about 6 months (around 09/29/2019) for Follow up with Pre-visit Labs, Meter, and Logs.  Glade Lloyd, MD Phone: [phone]  Fax: [phone]  -  This note was partially dictated with voice recognition software. Similar sounding words can be transcribed inadequately or may not  be corrected upon review.  03/29/2019, 1:19 PM

## 2019-04-02 ENCOUNTER — Other Ambulatory Visit (HOSPITAL_COMMUNITY): Payer: Self-pay | Admitting: Urology

## 2019-04-02 DIAGNOSIS — N200 Calculus of kidney: Secondary | ICD-10-CM

## 2019-04-03 ENCOUNTER — Other Ambulatory Visit (HOSPITAL_COMMUNITY): Payer: Self-pay | Admitting: Urology

## 2019-04-03 DIAGNOSIS — N200 Calculus of kidney: Secondary | ICD-10-CM

## 2019-04-06 ENCOUNTER — Ambulatory Visit (HOSPITAL_COMMUNITY)
Admission: RE | Admit: 2019-04-06 | Discharge: 2019-04-06 | Disposition: A | Discharge status: Home / Self Care | Payer: Medicare Other | Source: Ambulatory Visit | Attending: Urology | Admitting: Urology

## 2019-04-06 ENCOUNTER — Other Ambulatory Visit: Payer: Self-pay

## 2019-04-06 DIAGNOSIS — N200 Calculus of kidney: Secondary | ICD-10-CM | POA: Diagnosis present

## 2019-04-24 NOTE — Progress Notes (Signed)
Pt. Canceled appt. Will reschedule.

## 2019-05-01 ENCOUNTER — Ambulatory Visit (INDEPENDENT_AMBULATORY_CARE_PROVIDER_SITE_OTHER): Payer: Medicare Other

## 2019-05-01 ENCOUNTER — Other Ambulatory Visit: Payer: Self-pay

## 2019-05-01 DIAGNOSIS — I4821 Permanent atrial fibrillation: Secondary | ICD-10-CM | POA: Diagnosis not present

## 2019-05-01 DIAGNOSIS — Z5181 Encounter for therapeutic drug level monitoring: Secondary | ICD-10-CM | POA: Diagnosis not present

## 2019-05-01 LAB — POCT INR: INR: 2.3 (ref 2.0–3.0)

## 2019-05-01 NOTE — Patient Instructions (Signed)
Description   Continue coumadin 1 tablet daily except 1/2 tablet on Wednesdays Continue greens Recheck in 6 weeks

## 2019-06-07 ENCOUNTER — Telehealth: Payer: Self-pay | Admitting: Cardiology

## 2019-06-07 NOTE — Telephone Encounter (Signed)
Virtual Visit Pre-Appointment Phone Call  "(Name), I am calling you today to discuss your upcoming appointment. We are currently trying to limit exposure to the virus that causes COVID-19 by seeing patients at home rather than in the office."  1. "What is the BEST phone number to call the day of the visit?" - include this in appointment notes  2. Do you have or have access to (through a family member/friend) a smartphone with video capability that we can use for your visit?" a. If yes - list this number in appt notes as cell (if different from BEST phone #) and list the appointment type as a VIDEO visit in appointment notes b. If no - list the appointment type as a PHONE visit in appointment notes  3. Confirm consent - "In the setting of the current Covid19 crisis, you are scheduled for a (phone or video) visit with your provider on (date) at (time).  Just as we do with many in-office visits, in order for you to participate in this visit, we must obtain consent.  If you'd like, I can send this to your mychart (if signed up) or email for you to review.  Otherwise, I can obtain your verbal consent now.  All virtual visits are billed to your insurance company just like a normal visit would be.  By agreeing to a virtual visit, we'd like you to understand that the technology does not allow for your provider to perform an examination, and thus may limit your provider's ability to fully assess your condition. If your provider identifies any concerns that need to be evaluated in person, we will make arrangements to do so.  Finally, though the technology is pretty good, we cannot assure that it will always work on either your or our end, and in the setting of a video visit, we may have to convert it to a phone-only visit.  In either situation, we cannot ensure that we have a secure connection.  Are you willing to proceed?" STAFF: Did the patient verbally acknowledge consent to telehealth visit? Document  YES/NO here: yes  4. Advise patient to be prepared - "Two hours prior to your appointment, go ahead and check your blood pressure, pulse, oxygen saturation, and your weight (if you have the equipment to check those) and write them all down. When your visit starts, your provider will ask you for this information. If you have an Apple Watch or Kardia device, please plan to have heart rate information ready on the day of your appointment. Please have a pen and paper handy nearby the day of the visit as well."  5. Give patient instructions for MyChart download to smartphone OR [email or web address] as below if video visit (depending on what platform provider is using)  6. Inform patient they will receive a phone call 15 minutes prior to their appointment time (may be from unknown caller ID) so they should be prepared to answer    TELEPHONE CALL NOTE  Daniel Schaefer has been deemed a candidate for a follow-up tele-health visit to limit community exposure during the Covid-19 pandemic. I spoke with the patient via phone to ensure availability of phone/video source, confirm preferred email & phone number, and discuss instructions and expectations.  I reminded Daniel Schaefer to be prepared with any vital sign and/or heart rhythm information that could potentially be obtained via home monitoring, at the time of his visit. I reminded Daniel Schaefer to expect a phone call prior to  his visit.  Daniel Schaefer 06/07/2019 10:47 AM   INSTRUCTIONS FOR DOWNLOADING THE MYCHART APP TO SMARTPHONE  - The patient must first make sure to have activated MyChart and know their login information - If Apple, go to [employer] and type in MyChart in the search bar and download the app. If Android, ask patient to go to [employer] and type in [place] in the search bar and download the app. The app is free but as with any other app downloads, their phone may require them to verify saved payment information or  Apple/Android password.  - The patient will need to then log into the app with their MyChart username and password, and select  as their healthcare provider to link the account. When it is time for your visit, go to the MyChart app, find appointments, and click Begin Video Visit. Be sure to Select Allow for your device to access the Microphone and Camera for your visit. You will then be connected, and your provider will be with you shortly.  **If they have any issues connecting, or need assistance please contact MyChart service desk [phone])**  **If using a computer, in order to ensure the best quality for their visit they will need to use either of the following Internet Browsers: [employer], or Google Chrome**  IF USING DOXIMITY or DOXY.ME - The patient will receive a link just prior to their visit by text.     FULL LENGTH CONSENT FOR TELE-HEALTH VISIT   I hereby voluntarily request, consent and authorize [place] and its employed or contracted physicians, physician assistants, nurse practitioners or other licensed health care professionals (the Practitioner), to provide me with telemedicine health care services (the Services") as deemed necessary by the treating Practitioner. I acknowledge and consent to receive the Services by the Practitioner via telemedicine. I understand that the telemedicine visit will involve communicating with the Practitioner through live audiovisual communication technology and the disclosure of certain medical information by electronic transmission. I acknowledge that I have been given the opportunity to request an in-person assessment or other available alternative prior to the telemedicine visit and am voluntarily participating in the telemedicine visit.  I understand that I have the right to withhold or withdraw my consent to the use of telemedicine in the course of my care at any time, without affecting my right to future care  or treatment, and that the Practitioner or I may terminate the telemedicine visit at any time. I understand that I have the right to inspect all information obtained and/or recorded in the course of the telemedicine visit and may receive copies of available information for a reasonable fee.  I understand that some of the potential risks of receiving the Services via telemedicine include:   Delay or interruption in medical evaluation due to technological equipment failure or disruption;  Information transmitted may not be sufficient (e.g. poor resolution of images) to allow for appropriate medical decision making by the Practitioner; and/or   In rare instances, security protocols could fail, causing a breach of personal health information.  Furthermore, I acknowledge that it is my responsibility to provide information about my medical history, conditions and care that is complete and accurate to the best of my ability. I acknowledge that Practitioner's advice, recommendations, and/or decision may be based on factors not within their control, such as incomplete or inaccurate data provided by me or distortions of diagnostic images or specimens that may result from electronic transmissions. I  understand that the practice of medicine is not an exact science and that Practitioner makes no warranties or guarantees regarding treatment outcomes. I acknowledge that I will receive a copy of this consent concurrently upon execution via email to the email address I last provided but may also request a printed copy by calling the office of [place].    I understand that my insurance will be billed for this visit.   I have read or had this consent read to me.  I understand the contents of this consent, which adequately explains the benefits and risks of the Services being provided via telemedicine.   I have been provided ample opportunity to ask questions regarding this consent and the Services and have had  my questions answered to my satisfaction.  I give my informed consent for the services to be provided through the use of telemedicine in my medical care  By participating in this telemedicine visit I agree to the above.

## 2019-06-11 ENCOUNTER — Other Ambulatory Visit: Payer: Self-pay | Admitting: Cardiology

## 2019-06-14 ENCOUNTER — Other Ambulatory Visit: Payer: Self-pay

## 2019-06-14 ENCOUNTER — Ambulatory Visit (INDEPENDENT_AMBULATORY_CARE_PROVIDER_SITE_OTHER): Payer: Medicare Other | Admitting: *Deleted

## 2019-06-14 DIAGNOSIS — I4891 Unspecified atrial fibrillation: Secondary | ICD-10-CM

## 2019-06-14 DIAGNOSIS — I4821 Permanent atrial fibrillation: Secondary | ICD-10-CM

## 2019-06-14 DIAGNOSIS — Z5181 Encounter for therapeutic drug level monitoring: Secondary | ICD-10-CM

## 2019-06-14 LAB — POCT INR: INR: 2.5 (ref 2.0–3.0)

## 2019-06-14 NOTE — Patient Instructions (Signed)
Continue coumadin 1 tablet daily except 1/2 tablet on Wednesdays Continue greens Recheck in 6 weeks 

## 2019-06-21 ENCOUNTER — Encounter: Payer: Self-pay | Admitting: Cardiology

## 2019-06-21 ENCOUNTER — Telehealth (INDEPENDENT_AMBULATORY_CARE_PROVIDER_SITE_OTHER): Payer: Medicare Other | Admitting: Cardiology

## 2019-06-21 VITALS — BP 144/93 | HR 83 | Temp 97.1°F | Ht 70.0 in | Wt 219.0 lb

## 2019-06-21 DIAGNOSIS — I10 Essential (primary) hypertension: Secondary | ICD-10-CM

## 2019-06-21 DIAGNOSIS — I4821 Permanent atrial fibrillation: Secondary | ICD-10-CM

## 2019-06-21 NOTE — Progress Notes (Signed)
Virtual Visit via Telephone Note   This visit type was conducted due to national recommendations for restrictions regarding the COVID-19 Pandemic (e.g. social distancing) in an effort to limit this patient's exposure and mitigate transmission in our community.  Due to his co-morbid illnesses, this patient is at least at moderate risk for complications without adequate follow up.  This format is felt to be most appropriate for this patient at this time.  The patient did not have access to video technology/had technical difficulties with video requiring transitioning to audio format only (telephone).  All issues noted in this document were discussed and addressed.  No physical exam could be performed with this format.  Please refer to the patient's chart for his  consent to telehealth for [facility].   Date:  06/21/2019   ID:  Daniel Schaefer, DOB 1950/09/20, MRN [id number]  Patient Location: Home Provider Location: Office  PCP:  Sandi Mealy, MD  Cardiologist:  Rozann Lesches, MD Electrophysiologist:  None   Evaluation Performed:  Follow-Up Visit  Chief Complaint:   Cardiac follow-up  History of Present Illness:    Daniel Schaefer is a 68 y.o. male in January.  We spoke by phone today.  He does not report any problems with palpitations or chest pain, has been tolerating his atrial fibrillation well.  He continues on Coumadin with follow-up in the anticoagulation clinic.  Recent INR was 2.5.  He does not report any bleeding problems.  I reviewed interval lab work from July as outlined below.  He states that he has had been having trouble with an inguinal hernia, following with PCP.  The patient does not have symptoms concerning for COVID-19 infection (fever, chills, cough, or new shortness of breath).    Past Medical History:  Diagnosis Date  . Atrial fibrillation [facility])    Reportedly diagnosed November 2011  . Essential hypertension   . Prostate cancer [facility])    Radiation implants  . Type 2 diabetes mellitus ([place])    Past Surgical History:  Procedure Laterality Date  . Gold seed placement    . PROSTATE BIOPSY       Current Meds  Medication Sig  . amLODipine (NORVASC) 5 MG tablet Take 1 tablet by mouth daily.  . cetirizine (ZYRTEC) 10 MG tablet Take 10 mg by mouth daily.  . furosemide (LASIX) 20 MG tablet TAKE 1 TABLET BY MOUTH DAILY  . GLOBAL EASE INJECT PEN NEEDLES 31G X 8 MM MISC USE 1 DAILY AS DIRECTED - RUN ON CASH $15 - PER PC.  Marland Kitchen glucose blood test strip Use as instructed  . Insulin Glargine (BASAGLAR KWIKPEN) 100 UNIT/ML SOPN INJECT 60 UNITS INTO THE SKIN AT BEDTIME  . Insulin Pen Needle (B-D ULTRAFINE III SHORT PEN) 31G X 8 MM MISC 1 each by Does not apply route as directed.  . Lancets MISC 1 each by Does not apply route as directed.  Marland Kitchen lisinopril (PRINIVIL,ZESTRIL) 20 MG tablet Take 20 mg by mouth daily.  . metFORMIN (GLUCOPHAGE) 500 MG tablet TAKE 1 TABLET BY MOUTH TWICE DAILY WITH A MEAL  . metoprolol tartrate (LOPRESSOR) 25 MG tablet TAKE ONE TABLET BY MOUTH TWICE DAILY  . omeprazole (PRILOSEC) 20 MG capsule Take 20 mg by mouth daily.  . potassium chloride (K-DUR) 10 MEQ tablet TAKE 1 TABLET BY MOUTH DAILY  . pravastatin (PRAVACHOL) 40 MG tablet Take 1 tablet by mouth at bedtime.  . tadalafil (CIALIS) 20 MG tablet Take 20 mg by mouth  daily as needed for erectile dysfunction.  . tamsulosin (FLOMAX) 0.4 MG CAPS capsule Take 0.4 mg by mouth.  . triamcinolone cream (KENALOG) 0.1 % Apply 1 application topically 2 (two) times daily.  Marland Kitchen warfarin (COUMADIN) 5 MG tablet TAKE 1 TABLET BY MOUTH EVERY DAY THURSDAY-TUESDAY, TAKE 1/2 TABLET ON WEDNESDAY     Allergies:   Bactrim [sulfamethoxazole-trimethoprim], Other, and Tresiba flextouch [insulin degludec]   Social History   Tobacco Use  . Smoking status: Never Smoker  . Smokeless tobacco: Never Used  Substance Use Topics  . Alcohol use: No    Alcohol/week: 0.0 standard drinks     Frequency: Never  . Drug use: No     Family Hx: The patient's family history includes Cancer (age of onset: 50) in his father; Lymphoma (age of onset: 20) in his mother.  ROS:   Please see the history of present illness. All other systems reviewed and are negative.   Prior CV studies:   The following studies were reviewed today:  Echocardiogram 04/04/2013: Study Conclusions  - Left ventricle: The cavity size was normal. Wall thickness was increased in a pattern of mild LVH. Systolic function was normal. The estimated ejection fraction was in the range of 50% to 55%. Wall motion was normal; there were no regional wall motion abnormalities. The study is not technically sufficient to allow evaluation of LV diastolic function, due to underlying atrial fibrillation. - Left atrium: The atrium was mildly dilated. - Pulmonic valve: Mild regurgitation.  Labs/Other Tests and Data Reviewed:    EKG:  An ECG dated 10/09/2018 was personally reviewed today and demonstrated:  Rate controlled atrial fibrillation with low voltage.  Recent Labs: 03/22/2019: BUN 20; Creatinine 1.17 March 2019: Potassium 4.1, creatinine 1.37, hemoglobin A1c 6.5%, cholesterol 146, triglycerides 115, HDL 42, LDL 81  Recent Lipid Panel Lab Results  Component Value Date/Time   CHOL 146 03/22/2019   TRIG 115 03/22/2019   HDL 42 03/22/2019   LDLCALC 81 03/22/2019    Wt Readings from Last 3 Encounters:  06/21/19 219 lb (99.3 kg)  03/29/19 228 lb 6.3 oz (103.6 kg)  03/29/19 228 lb (103.4 kg)     Objective:    Vital Signs:  BP (!) 144/93   Pulse 83   Temp (!) 97.1 F (36.2 C) (Temporal)   Ht 5\' 10"  (1.778 m)   Wt 219 lb (99.3 kg)   SpO2 98%   BMI 31.42 kg/m    Patient spoke in full sentences, not short of breath. No audible wheezing or coughing. Speech pattern normal.  ASSESSMENT & PLAN:    1.  Permanent atrial fibrillation with CHADSVASC score of 3.  Patient is doing well without  palpitations on Lopressor and continues on Coumadin for stroke prophylaxis.  Continue observation, follow-up ECG in 6 months.  2.  Essential hypertension, blood pressure was elevated today.  He reports compliance with his current antihypertensive regimen.  Recommend that he keep follow-up with Dr. Lorra Hals.  COVID-19 Education: The signs and symptoms of COVID-19 were discussed with the patient and how to seek care for testing (follow up with PCP or arrange E-visit).  The importance of social distancing was discussed today.  Time:   Today, I have spent 8 minutes with the patient with telehealth technology discussing the above problems.     Medication Adjustments/Labs and Tests Ordered: Current medicines are reviewed at length with the patient today.  Concerns regarding medicines are outlined above.   Tests Ordered: No orders of  the defined types were placed in this encounter.   Medication Changes: No orders of the defined types were placed in this encounter.   Follow Up:  In Person 6 months in the [place] office.  Signed, Rozann Lesches, MD  06/21/2019 9:54 AM    [place]

## 2019-06-21 NOTE — Patient Instructions (Addendum)
Medication Instructions:   Your physician recommends that you continue on your current medications as directed. Please refer to the Current Medication list given to you today.  Labwork:  NONE  Testing/Procedures:  NONE  Follow-Up:  Your physician recommends that you schedule a follow-up appointment in: 6 month. You will receive a reminder letter in the mail in about 4 months reminding you to call and schedule your appointment. If you don't receive this letter, please contact our office.  Any Other Special Instructions Will Be Listed Below (If Applicable).  If you need a refill on your cardiac medications before your next appointment, please call your pharmacy. 

## 2019-07-17 ENCOUNTER — Telehealth: Payer: Self-pay | Admitting: Pharmacist

## 2019-07-17 NOTE — Telephone Encounter (Signed)
Spoke with pt who prefers to continue on warfarin - his medications are already very expensive due to his insulin (ends up in the donut hole early on in the year).

## 2019-07-17 NOTE — Telephone Encounter (Signed)
Called pt to discuss changing from warfarin to [place] therapy due to improved efficacy and safety profile, as well as decreased monitoring given COVID-19 pandemic.  Left message for pt to discuss. Will need to clarify if he has any Part D insurance (none on file).

## 2019-07-26 ENCOUNTER — Other Ambulatory Visit: Payer: Self-pay

## 2019-07-26 ENCOUNTER — Ambulatory Visit (INDEPENDENT_AMBULATORY_CARE_PROVIDER_SITE_OTHER): Payer: Medicare Other | Admitting: *Deleted

## 2019-07-26 DIAGNOSIS — I4891 Unspecified atrial fibrillation: Secondary | ICD-10-CM | POA: Diagnosis not present

## 2019-07-26 DIAGNOSIS — I4821 Permanent atrial fibrillation: Secondary | ICD-10-CM

## 2019-07-26 DIAGNOSIS — Z5181 Encounter for therapeutic drug level monitoring: Secondary | ICD-10-CM | POA: Diagnosis not present

## 2019-07-26 LAB — POCT INR: INR: 2.6 (ref 2.0–3.0)

## 2019-07-26 NOTE — Patient Instructions (Signed)
Continue coumadin 1 tablet daily except 1/2 tablet on Wednesdays Continue greens Recheck in 6 weeks 

## 2019-09-03 DIAGNOSIS — M65342 Trigger finger, left ring finger: Secondary | ICD-10-CM | POA: Insufficient documentation

## 2019-09-04 ENCOUNTER — Other Ambulatory Visit: Payer: Self-pay

## 2019-09-04 ENCOUNTER — Ambulatory Visit (INDEPENDENT_AMBULATORY_CARE_PROVIDER_SITE_OTHER): Payer: Medicare Other | Admitting: *Deleted

## 2019-09-04 DIAGNOSIS — I4821 Permanent atrial fibrillation: Secondary | ICD-10-CM

## 2019-09-04 DIAGNOSIS — Z5181 Encounter for therapeutic drug level monitoring: Secondary | ICD-10-CM

## 2019-09-04 LAB — POCT INR: INR: 2.3 (ref 2.0–3.0)

## 2019-09-04 NOTE — Patient Instructions (Signed)
Continue coumadin 1 tablet daily except 1/2 tablet on Wednesdays Continue greens Recheck in 6 weeks

## 2019-09-09 ENCOUNTER — Other Ambulatory Visit: Payer: Self-pay | Admitting: Cardiology

## 2019-09-09 ENCOUNTER — Other Ambulatory Visit: Payer: Self-pay | Admitting: "Endocrinology

## 2019-09-24 ENCOUNTER — Other Ambulatory Visit: Payer: Self-pay

## 2019-09-24 DIAGNOSIS — Z8546 Personal history of malignant neoplasm of prostate: Secondary | ICD-10-CM

## 2019-09-24 DIAGNOSIS — N200 Calculus of kidney: Secondary | ICD-10-CM

## 2019-09-25 ENCOUNTER — Other Ambulatory Visit: Payer: Self-pay | Admitting: Cardiology

## 2019-09-25 LAB — BASIC METABOLIC PANEL
BUN: 19 (ref 4–21)
Creatinine: 1.3 (ref 0.6–1.3)
Glucose: 144
Potassium: 4.5 (ref 3.4–5.3)
Sodium: 140 (ref 137–147)

## 2019-09-25 LAB — COMPREHENSIVE METABOLIC PANEL
Albumin: 4.1 (ref 3.5–5.0)
Calcium: 8.8 (ref 8.7–10.7)

## 2019-09-25 LAB — HEPATIC FUNCTION PANEL
ALT: 11 (ref 10–40)
AST: 13 — AB (ref 14–40)
Alkaline Phosphatase: 66 (ref 25–125)
Bilirubin, Total: 0.2

## 2019-09-25 LAB — HEMOGLOBIN A1C: Hemoglobin A1C: 6.6

## 2019-09-25 MED ORDER — METOPROLOL TARTRATE 25 MG PO TABS: 25.0000 mg | ORAL_TABLET | Freq: Two times a day (BID) | ORAL | 1 refills | Status: DC

## 2019-09-25 MED ORDER — FUROSEMIDE 20 MG PO TABS: 20.0000 mg | ORAL_TABLET | Freq: Every day | ORAL | 1 refills | Status: DC

## 2019-09-25 MED ORDER — LISINOPRIL 20 MG PO TABS: 20.0000 mg | ORAL_TABLET | Freq: Every day | ORAL | 1 refills | Status: DC

## 2019-09-25 MED ORDER — PRAVASTATIN SODIUM 40 MG PO TABS: 40.0000 mg | ORAL_TABLET | Freq: Every day | ORAL | 1 refills | Status: DC

## 2019-09-25 MED ORDER — POTASSIUM CHLORIDE ER 10 MEQ PO TBCR: 10.0000 meq | EXTENDED_RELEASE_TABLET | Freq: Every day | ORAL | 1 refills | Status: DC

## 2019-09-25 MED ORDER — AMLODIPINE BESYLATE 5 MG PO TABS: 5.0000 mg | ORAL_TABLET | Freq: Every day | ORAL | 1 refills | Status: DC

## 2019-09-25 NOTE — Telephone Encounter (Signed)
Medication sent to pharmacy  

## 2019-09-25 NOTE — Telephone Encounter (Signed)
Please send all Cardiac Medication to [facility] in [place]  He has switched pharmacies and they will not call former pharmacy to get information

## 2019-10-02 ENCOUNTER — Ambulatory Visit (INDEPENDENT_AMBULATORY_CARE_PROVIDER_SITE_OTHER): Payer: Medicare Other | Admitting: "Endocrinology

## 2019-10-02 ENCOUNTER — Ambulatory Visit: Payer: Medicare Other | Admitting: "Endocrinology

## 2019-10-02 ENCOUNTER — Encounter: Payer: Self-pay | Admitting: "Endocrinology

## 2019-10-02 DIAGNOSIS — E1159 Type 2 diabetes mellitus with other circulatory complications: Secondary | ICD-10-CM

## 2019-10-02 DIAGNOSIS — E782 Mixed hyperlipidemia: Secondary | ICD-10-CM | POA: Diagnosis not present

## 2019-10-02 DIAGNOSIS — Z794 Long term (current) use of insulin: Secondary | ICD-10-CM

## 2019-10-02 DIAGNOSIS — I10 Essential (primary) hypertension: Secondary | ICD-10-CM | POA: Diagnosis not present

## 2019-10-02 NOTE — Progress Notes (Signed)
10/02/2019                                                      Endocrinology Telehealth Visit Follow up Note -During COVID -19 Pandemic  This visit type was conducted due to national recommendations for restrictions regarding the COVID-19 Pandemic  in an effort to limit this patient's exposure and mitigate transmission of the corona virus.  Due to his co-morbid illnesses, Daniel Schaefer is at  moderate to high risk for complications without adequate follow up.  This format is felt to be most appropriate for him at this time.  I connected with this patient on 10/02/2019   by telephone and verified that I am speaking with the correct person using two identifiers. Daniel Schaefer, Feb 01, 1951. he has verbally consented to this visit. All issues noted in this document were discussed and addressed. The format was not optimal for physical exam.     Subjective:    Patient ID: Daniel Schaefer, male    DOB: 1950-09-25,    Past Medical History:  Diagnosis Date  . Atrial fibrillation [facility])    Reportedly diagnosed November 2011  . Essential hypertension   . Prostate cancer [facility])    Radiation implants  . Type 2 diabetes mellitus ([place])    Past Surgical History:  Procedure Laterality Date  . Gold seed placement    . PROSTATE BIOPSY     Social History   Socioeconomic History  . Marital status: Divorced    Spouse name: Not on file  . Number of children: Not on file  . Years of education: Not on file  . Highest education level: Not on file  Occupational History  . Not on file  Tobacco Use  . Smoking status: Never Smoker  . Smokeless tobacco: Never Used  Substance and Sexual Activity  . Alcohol use: No    Alcohol/week: 0.0 standard drinks  . Drug use: No  . Sexual activity: Not on file  Other Topics Concern  . Not on file  Social History Narrative  . Not on file   Social Determinants of Health   Financial Resource Strain:   . Difficulty of Paying Living Expenses: Not on file   Food Insecurity:   . Worried About Charity fundraiser in the Last Year: Not on file  . Ran Out of Food in the Last Year: Not on file  Transportation Needs:   . Lack of Transportation (Medical): Not on file  . Lack of Transportation (Non-Medical): Not on file  Physical Activity:   . Days of Exercise per Week: Not on file  . Minutes of Exercise per Session: Not on file  Stress:   . Feeling of Stress : Not on file  Social Connections:   . Frequency of Communication with Friends and Family: Not on file  . Frequency of Social Gatherings with Friends and Family: Not on file  . Attends Religious Services: Not on file  . Active Member of Clubs or Organizations: Not on file  . Attends Archivist Meetings: Not on file  . Marital Status: Not on file   Outpatient Encounter Medications as of 10/02/2019  Medication Sig  . amLODipine (NORVASC) 5 MG tablet Take 1 tablet (5 mg total) by mouth daily.  . cetirizine (ZYRTEC) 10 MG tablet Take 10  mg by mouth daily.  . furosemide (LASIX) 20 MG tablet Take 1 tablet (20 mg total) by mouth daily.  Marland Kitchen GLOBAL EASE INJECT PEN NEEDLES 31G X 8 MM MISC USE 1 DAILY AS DIRECTED - RUN ON CASH $15 - PER PC.  Marland Kitchen glucose blood test strip Use as instructed  . Insulin Glargine (BASAGLAR KWIKPEN) 100 UNIT/ML SOPN INJECT 60 UNITS INTO THE SKIN AT BEDTIME  . Insulin Pen Needle (B-D ULTRAFINE III SHORT PEN) 31G X 8 MM MISC 1 each by Does not apply route as directed.  . Lancets MISC 1 each by Does not apply route as directed.  Marland Kitchen lisinopril (ZESTRIL) 20 MG tablet Take 1 tablet (20 mg total) by mouth daily.  . metFORMIN (GLUCOPHAGE) 500 MG tablet TAKE 1 TABLET BY MOUTH TWICE DAILY WITH A MEAL  . metoprolol tartrate (LOPRESSOR) 25 MG tablet Take 1 tablet (25 mg total) by mouth 2 (two) times daily.  Marland Kitchen omeprazole (PRILOSEC) 20 MG capsule Take 20 mg by mouth daily.  . potassium chloride (KLOR-CON) 10 MEQ tablet Take 1 tablet (10 mEq total) by mouth daily.  . pravastatin  (PRAVACHOL) 40 MG tablet Take 1 tablet (40 mg total) by mouth at bedtime.  . tadalafil (CIALIS) 20 MG tablet Take 20 mg by mouth daily as needed for erectile dysfunction.  . tamsulosin (FLOMAX) 0.4 MG CAPS capsule Take 0.4 mg by mouth.  . triamcinolone cream (KENALOG) 0.1 % Apply 1 application topically 2 (two) times daily.  Marland Kitchen warfarin (COUMADIN) 5 MG tablet TAKE 1 TABLET BY MOUTH EVERY DAY THURSDAY-TUESDAY, TAKE 1/2 TABLET ON WEDNESDAY   No facility-administered encounter medications on file as of 10/02/2019.   ALLERGIES: Allergies  Allergen Reactions  . Bactrim [Sulfamethoxazole-Trimethoprim]   . Other     Cannot mix warfarin and bactrim   . [name] Flextouch [Insulin Degludec] Rash   VACCINATION STATUS:  There is no immunization history on file for this patient.  Diabetes He presents for his follow-up diabetic visit. He has type 2 diabetes mellitus. Onset time: He was diagnosed at approximate age of 1 years. His disease course has been stable. There are no hypoglycemic associated symptoms. There are no diabetic associated symptoms. There are no hypoglycemic complications. Symptoms are stable. There are no diabetic complications. Risk factors for coronary artery disease include hypertension, dyslipidemia and sedentary lifestyle. Current diabetic treatment includes oral agent (dual therapy). He is compliant with treatment most of the time. His weight is increasing steadily. He is following a generally unhealthy diet. His home blood glucose trend is increasing steadily. His breakfast blood glucose range is generally 140-180 mg/dl. His bedtime blood glucose range is generally 140-180 mg/dl. His overall blood glucose range is 140-180 mg/dl. An ACE inhibitor/angiotensin II receptor blocker is being taken.  Hypertension This is a chronic problem. The current episode started more than 1 year ago. Risk factors for coronary artery disease include diabetes mellitus, dyslipidemia, male gender,  sedentary lifestyle and obesity. Past treatments include ACE inhibitors and calcium channel blockers.  Hyperlipidemia This is a chronic problem. The current episode started more than 1 year ago. Exacerbating diseases include diabetes. Current antihyperlipidemic treatment includes statins. Risk factors for coronary artery disease include dyslipidemia, diabetes mellitus, hypertension, male sex, a sedentary lifestyle and obesity.   Review of systems: Limited as above   Objective:    There were no vitals taken for this visit.  Wt Readings from Last 3 Encounters:  06/21/19 219 lb (99.3 kg)  03/29/19 228 lb 6.3 oz (  103.6 kg)  03/29/19 228 lb (103.4 kg)      Results for orders placed or performed in visit on [id number]  Basic metabolic panel  Result Value Ref Range   Glucose 144    BUN 19 4 - 21   Creatinine 1.3 0.6 - 1.3   Potassium 4.5 3.4 - 5.3   Sodium 140 137 - 147  Comprehensive metabolic panel  Result Value Ref Range   Calcium 8.8 8.7 - 10.7   Albumin 4.1 3.5 - 5.0  Hepatic function panel  Result Value Ref Range   Alkaline Phosphatase 66 25 - 125   ALT 11 10 - 40   AST 13 (A) 14 - 40   Bilirubin, Total 0.2   Hemoglobin A1c  Result Value Ref Range   Hemoglobin A1C 6.6    Complete Blood Count (Most recent): Lab Results  Component Value Date   WBC 6.6 08/31/2013   HGB 14.0 08/31/2013   HCT 40.1 08/31/2013   MCV 75.9 (L) 08/31/2013   PLT 235 08/31/2013   Chemistry (most recent): Lab Results  Component Value Date   NA 140 09/25/2019   K 4.5 09/25/2019   CL 102 08/31/2013   CO2 29 08/31/2013   BUN 19 09/25/2019   CREATININE 1.3 09/25/2019   Lipid Panel     Component Value Date/Time   CHOL 146 03/22/2019 0000   TRIG 115 03/22/2019 0000   HDL 42 03/22/2019 0000   LDLCALC 81 03/22/2019 0000    Assessment & Plan:   1. Type 2 diabetes mellitus with stage 3 renal insufficiency, with long-term current use of insulin (HCC)  He reports controlled glycemic profile  to near target levels.  His previsit A1c was 6.6%, remaining stable.   -Patient remains at a high risk for more acute and chronic complications of diabetes which include CAD, CVA, CKD, retinopathy, and neuropathy. These are all discussed in detail with the patient. -I have re-counseled the patient on diet management and weight loss, by adopting a carbohydrate restricted / protein rich  Diet. Patient is advised to stick to a routine mealtimes to eat 3 meals  a day and avoid unnecessary snacks ( to snack only to correct hypoglycemia).  - he  admits there is a room for improvement in his diet and drink choices. -  Suggestion is made for him to avoid simple carbohydrates  from his diet including Cakes, Sweet Desserts / Pastries, Ice Cream, Soda (diet and regular), Sweet Tea, Candies, Chips, Cookies, Sweet Pastries,  Store Bought Juices, Alcohol in Excess of  1-2 drinks a day, Artificial Sweeteners, Coffee Creamer, and "Sugar-free" Products. This will help patient to have stable blood glucose profile and potentially avoid unintended weight gain.  -He is following with Jearld Fenton, CDE for diabetes education.  - He is  willing to continue on basal insulin. - He is getting more comfortable injecting insulin, currently Basaglar.  He is advised to continue Basaglar 60 units nightly, associated with strict monitoring of blood glucose 2 times a day-daily before breakfast and, and at bedtime.    -Patient is encouraged to call clinic for blood glucose levels less than 70 or above 150 mg /dl x 3. -He expresses concern of cost of medications including insulin.  -He is advised to continue  metformin  500 mg p.o. twice daily , therapeutically suitable for patient.  - He is generally hesitant and reluctant to add medications. - Patient is not a suitable candidate for incretin therapy  Nor  SGLT2i.  - Patient specific target  for A1c; LDL, HDL, Triglycerides, and  Waist Circumference were discussed in  detail.  2) BP/HTN: he is advised to home monitor blood pressure and report if > 140/90 on 2 separate readings.    He is advised to continue his blood pressure medications including lisinopril 20 mg p.o. daily.   3) Lipids/HPL: His recent lipid panel showed elevated LDL of 81 from 56.  He is advised to be consistent with his pravastatin 40 mg p.o. daily at bedtime.    4)  Weight/Diet: He is following with Jearld Fenton, CD for diabetes education.  No success in weight loss,  exercise, and carbohydrates information provided.  5) Chronic Care/Health Maintenance:  -Patient on ACEI and Statin medications and encouraged to continue to follow up with Ophthalmology, Podiatrist at least yearly or according to recommendations, and advised to stay away from smoking. I have recommended yearly flu vaccine and pneumonia vaccination at least every 5 years; moderate intensity exercise for up to 150 minutes weekly; and  sleep for at least 7 hours a day.  - Time spent on this patient care encounter:  35 min, of which >50% was spent in  counseling and the rest reviewing his  current and  previous labs/studies ( including abstraction from other facilities),  previous treatments, his blood glucose readings, and medications' doses and developing a plan for long-term care based on the latest recommendations for standards of care; and documenting his care.  Daniel Schaefer participated in the discussions, expressed understanding, and voiced agreement with the above plans.  All questions were answered to his satisfaction. he is encouraged to contact clinic should he have any questions or concerns prior to his return visit.   Follow up plan: Return in about 6 months (around 03/31/2020) for Include 8 log sheets.  Glade Lloyd, MD Phone: [phone]  Fax: [phone]  -  This note was partially dictated with voice recognition software. Similar sounding words can be transcribed inadequately or may not  be corrected  upon review.  10/02/2019, 12:54 PM

## 2019-10-02 NOTE — Patient Instructions (Signed)
COVID-19 Vaccine Information can be found at: [email or web address] For questions related to vaccine distribution or appointments, please email [email or web address] or call [phone]    

## 2019-10-16 ENCOUNTER — Other Ambulatory Visit: Payer: Self-pay

## 2019-10-16 ENCOUNTER — Ambulatory Visit (INDEPENDENT_AMBULATORY_CARE_PROVIDER_SITE_OTHER): Payer: Medicare Other | Admitting: *Deleted

## 2019-10-16 DIAGNOSIS — I4821 Permanent atrial fibrillation: Secondary | ICD-10-CM

## 2019-10-16 DIAGNOSIS — Z5181 Encounter for therapeutic drug level monitoring: Secondary | ICD-10-CM

## 2019-10-16 DIAGNOSIS — I4891 Unspecified atrial fibrillation: Secondary | ICD-10-CM

## 2019-10-16 LAB — POCT INR: INR: 2.1 (ref 2.0–3.0)

## 2019-10-16 NOTE — Patient Instructions (Signed)
Continue coumadin 1 tablet daily except 1/2 tablet on Wednesdays Continue greens Recheck in 6 weeks

## 2019-11-27 ENCOUNTER — Ambulatory Visit (INDEPENDENT_AMBULATORY_CARE_PROVIDER_SITE_OTHER): Payer: Medicare Other | Admitting: *Deleted

## 2019-11-27 ENCOUNTER — Other Ambulatory Visit: Payer: Self-pay

## 2019-11-27 DIAGNOSIS — I4821 Permanent atrial fibrillation: Secondary | ICD-10-CM | POA: Diagnosis not present

## 2019-11-27 DIAGNOSIS — Z5181 Encounter for therapeutic drug level monitoring: Secondary | ICD-10-CM | POA: Diagnosis not present

## 2019-11-27 LAB — POCT INR: INR: 2.1 (ref 2.0–3.0)

## 2019-11-27 NOTE — Patient Instructions (Signed)
Continue coumadin 1 tablet daily except 1/2 tablet on Wednesdays Continue greens Recheck in 6 weeks

## 2019-12-17 ENCOUNTER — Other Ambulatory Visit: Payer: Self-pay | Admitting: *Deleted

## 2019-12-17 ENCOUNTER — Other Ambulatory Visit: Payer: Self-pay

## 2019-12-17 MED ORDER — METFORMIN HCL 500 MG PO TABS: ORAL_TABLET | ORAL | 0 refills | Status: DC

## 2019-12-17 MED ORDER — WARFARIN SODIUM 5 MG PO TABS: ORAL_TABLET | ORAL | 6 refills | Status: DC

## 2020-01-08 ENCOUNTER — Other Ambulatory Visit: Payer: Self-pay

## 2020-01-08 ENCOUNTER — Ambulatory Visit (INDEPENDENT_AMBULATORY_CARE_PROVIDER_SITE_OTHER): Payer: Medicare Other | Admitting: *Deleted

## 2020-01-08 DIAGNOSIS — I4821 Permanent atrial fibrillation: Secondary | ICD-10-CM | POA: Diagnosis not present

## 2020-01-08 DIAGNOSIS — I4891 Unspecified atrial fibrillation: Secondary | ICD-10-CM

## 2020-01-08 DIAGNOSIS — Z5181 Encounter for therapeutic drug level monitoring: Secondary | ICD-10-CM | POA: Diagnosis not present

## 2020-01-08 LAB — POCT INR: INR: 2.4 (ref 2.0–3.0)

## 2020-01-08 NOTE — Patient Instructions (Signed)
Continue coumadin 1 tablet daily except 1/2 tablet on Wednesdays Continue greens Recheck in 6 weeks

## 2020-01-09 ENCOUNTER — Encounter: Payer: Self-pay | Admitting: Family Medicine

## 2020-01-09 ENCOUNTER — Other Ambulatory Visit: Payer: Self-pay

## 2020-01-09 ENCOUNTER — Ambulatory Visit (INDEPENDENT_AMBULATORY_CARE_PROVIDER_SITE_OTHER): Payer: Medicare Other | Admitting: Family Medicine

## 2020-01-09 VITALS — BP 126/82 | HR 70 | Ht 70.5 in | Wt 228.6 lb

## 2020-01-09 DIAGNOSIS — I10 Essential (primary) hypertension: Secondary | ICD-10-CM | POA: Diagnosis not present

## 2020-01-09 DIAGNOSIS — I4821 Permanent atrial fibrillation: Secondary | ICD-10-CM

## 2020-01-09 DIAGNOSIS — E782 Mixed hyperlipidemia: Secondary | ICD-10-CM | POA: Diagnosis not present

## 2020-01-09 NOTE — Progress Notes (Addendum)
Cardiology Office Note  Date: 01/10/2020   ID: Daniel, Schaefer 02-Mar-1951, MRN [id number]  PCP:  Leeanne Rio, MD  Cardiologist:  Rozann Lesches, MD Electrophysiologist:  None   Chief Complaint: Follow-up atrial fibrillation and hypertension  History of Present Illness: Daniel Schaefer is a 69 y.o. male with a history of atrial fibrillation, hypertension, type 2 diabetes.  Last encounter was Dr. Domenic Polite on 06/21/2019 via telemedicine.  At that time patient did not report any problems with palpitations or chest pain.  He was tolerating his atrial fibrillation well and continuing on Coumadin without bleeding problems.  At that time he was having trouble with an inguinal hernia following with his PCP.  Regarding his PAF he had a CHA2DS2-VASc score 3.  He was doing well with Lopressor and continuing Coumadin for stroke prophylaxis.  Blood pressure was elevated on that visit but he was compliant with his current antihypertensive regimen.  He was advised to follow-up with his PCP.  Today patient states he has been doing well in the interim since last visit.  He denies any recent acute illnesses, hospitalizations.  Denies any progressive anginal or exertional symptoms.  He remains in atrial fibrillation via EKG today with a rate of 68.  Recent INR with Edrick Oh was 2.4.  Patient denies any orthostatic symptoms, presyncopal or syncopal episodes, stroke or TIA-like symptoms, bleeding issues, PND or orthopnea, lower extremity edema, or significant weight gain.  States she has not been very active over the last year due to Covid restrictions and staying at home.  States he was recently started walking some approximately 1/2 mile a few times a week.  He states it can take him time to build up some endurance and activity tolerance.  States his hemoglobin A1c is doing better since starting Basaglar insulin injections.  He states he is trying to eat better but sometimes slips and eats the wrong  things.  States he been compliant with all of his cardiac and cholesterol medications.  Past Medical History:  Diagnosis Date  . Atrial fibrillation [facility])    Reportedly diagnosed November 2011  . Essential hypertension   . Prostate cancer [facility])    Radiation implants  . Type 2 diabetes mellitus ([place])     Past Surgical History:  Procedure Laterality Date  . Gold seed placement    . PROSTATE BIOPSY      Current Outpatient Medications  Medication Sig Dispense Refill  . acetaminophen (TYLENOL) 325 MG tablet Take 650 mg by mouth as needed.    Marland Kitchen amLODipine (NORVASC) 5 MG tablet Take 1 tablet (5 mg total) by mouth daily. 90 tablet 1  . cetirizine (ZYRTEC) 10 MG tablet Take 10 mg by mouth daily.    . Cholecalciferol (VITAMIN D3 PO) Take 1 tablet by mouth daily.    . furosemide (LASIX) 20 MG tablet Take 1 tablet (20 mg total) by mouth daily. 90 tablet 1  . GLOBAL EASE INJECT PEN NEEDLES 31G X 8 MM MISC USE 1 DAILY AS DIRECTED - RUN ON CASH $15 - PER PC. 100 each 5  . glucose blood test strip Use as instructed 100 each 12  . Insulin Glargine (BASAGLAR KWIKPEN) 100 UNIT/ML SOPN INJECT 60 UNITS INTO THE SKIN AT BEDTIME 10 pen 2  . Insulin Pen Needle (B-D ULTRAFINE III SHORT PEN) 31G X 8 MM MISC 1 each by Does not apply route as directed. 50 each 2  . Lancets MISC 1 each by Does  not apply route as directed. 100 each 3  . lisinopril (ZESTRIL) 20 MG tablet Take 1 tablet (20 mg total) by mouth daily. 90 tablet 1  . metFORMIN (GLUCOPHAGE) 500 MG tablet TAKE 1 TABLET BY MOUTH TWICE DAILY WITH A MEAL 182 tablet 0  . metoprolol tartrate (LOPRESSOR) 25 MG tablet Take 1 tablet (25 mg total) by mouth 2 (two) times daily. 180 tablet 1  . omeprazole (PRILOSEC) 20 MG capsule Take 20 mg by mouth daily.    . potassium chloride (KLOR-CON) 10 MEQ tablet Take 1 tablet (10 mEq total) by mouth daily. 90 tablet 1  . pravastatin (PRAVACHOL) 40 MG tablet Take 1 tablet (40 mg total) by mouth at bedtime. 90 tablet 1  .  tadalafil (CIALIS) 20 MG tablet Take 20 mg by mouth daily as needed for erectile dysfunction.    . tamsulosin (FLOMAX) 0.4 MG CAPS capsule Take 0.4 mg by mouth daily.     Marland Kitchen warfarin (COUMADIN) 5 MG tablet Take 1 tablet daily except 1/2 tablet on Wednesdays or as directed (Patient taking differently: Take 1 tablet daily except 1/2 tablet on Wednesdays or as directed - managed by coumadin clinic) 35 tablet 6   No current facility-administered medications for this visit.   Allergies:  Bactrim [sulfamethoxazole-trimethoprim], Other, and Tresiba flextouch [insulin degludec]   Social History: The patient  reports that he has never smoked. He has never used smokeless tobacco. He reports that he does not drink alcohol or use drugs.   Family History: The patient's family history includes Cancer (age of onset: 34) in his father; Lymphoma (age of onset: 63) in his mother.   ROS:  Please see the history of present illness. Otherwise, complete review of systems is positive for none.  All other systems are reviewed and negative.   Physical Exam: VS:  BP 126/82   Pulse 70   Ht 5' 10.5" (1.791 m)   Wt 228 lb 9.6 oz (103.7 kg)   SpO2 97%   BMI 32.34 kg/m , BMI Body mass index is 32.34 kg/m.  Wt Readings from Last 3 Encounters:  01/09/20 228 lb 9.6 oz (103.7 kg)  06/21/19 219 lb (99.3 kg)  03/29/19 228 lb 6.3 oz (103.6 kg)    General: Patient appears comfortable at rest. Neck: Supple, no elevated JVP or carotid bruits, no thyromegaly. Lungs: Clear to auscultation, nonlabored breathing at rest. Cardiac: Irregularly irregular rate and rhythm, no S3 or significant systolic murmur, no pericardial rub. Extremities: No pitting edema, distal pulses 2+. Skin: Warm and dry. Musculoskeletal: No kyphosis. Neuropsychiatric: Alert and oriented x3, affect grossly appropriate.  ECG:  An ECG dated 01/09/2020 was personally reviewed today and demonstrated:  Atrial fibrillation with a rate of 68 bpm  Recent  Labwork: 09/25/2019: ALT 11; AST 13; BUN 19; Creatinine 1.3; Potassium 4.5; Sodium 140     Component Value Date/Time   CHOL 146 03/22/2019 0000   TRIG 115 03/22/2019 0000   HDL 42 03/22/2019 0000   [place] 81 03/22/2019 0000    Other Studies Reviewed Today:  Echocardiogram 04/04/2013: Study Conclusions  - Left ventricle: The cavity size was normal. Wall thickness was increased in a pattern of mild LVH. Systolic function was normal. The estimated ejection fraction was in the range of 50% to 55%. Wall motion was normal; there were no regional wall motion abnormalities. The study is not technically sufficient to allow evaluation of LV diastolic function, due to underlying atrial fibrillation. - Left atrium: The atrium was mildly  dilated. - Pulmonic valve: Mild regurgitation.  Assessment and Plan:  1. Permanent atrial fibrillation ([place])   2. Essential hypertension   3. Mixed hyperlipidemia    1. Permanent atrial fibrillation (HCC) EKG today shows atrial fibrillation with a rate of 68.  Patient states she has been compliant with his metoprolol and Coumadin.  Recent INR 2.4.  Continue Coumadin as directed 1 tablet daily except 1/2 tablet on Wednesdays.  Continue metoprolol 25 mg p.o. twice daily.  2. Essential hypertension Blood pressure is well controlled on current medications.  Blood pressure today is 126/82.  Continue lisinopril 20 mg daily, Lasix 20 mg daily.  3. HLD Last lipid levels on March 22, 2019; TG 115, TC 146, HDL 42, LDL 81.  Continue pravastatin 40 mg p.o. daily.  Medication Adjustments/Labs and Tests Ordered: Current medicines are reviewed at length with the patient today.  Concerns regarding medicines are outlined above.   Disposition: Follow-up with Dr. Domenic Polite or APP 6 months  Signed, Levell July, NP 01/10/2020 2:34 PM    [facility] [address], [place], [place] [address] Phone: [phone]; Fax: [phone]

## 2020-01-09 NOTE — Patient Instructions (Signed)
Medication Instructions:    Your physician recommends that you continue on your current medications as directed. Please refer to the Current Medication list given to you today.  Labwork:  NONE  Testing/Procedures:  NONE  Follow-Up:  Your physician recommends that you schedule a follow-up appointment in: 6 months (office)  Any Other Special Instructions Will Be Listed Below (If Applicable).  If you need a refill on your cardiac medications before your next appointment, please call your pharmacy. 

## 2020-02-19 ENCOUNTER — Ambulatory Visit (INDEPENDENT_AMBULATORY_CARE_PROVIDER_SITE_OTHER): Payer: Medicare Other | Admitting: *Deleted

## 2020-02-19 ENCOUNTER — Other Ambulatory Visit: Payer: Self-pay

## 2020-02-19 DIAGNOSIS — Z5181 Encounter for therapeutic drug level monitoring: Secondary | ICD-10-CM

## 2020-02-19 DIAGNOSIS — I4891 Unspecified atrial fibrillation: Secondary | ICD-10-CM | POA: Diagnosis not present

## 2020-02-19 DIAGNOSIS — I4821 Permanent atrial fibrillation: Secondary | ICD-10-CM

## 2020-02-19 LAB — POCT INR: INR: 2 (ref 2.0–3.0)

## 2020-02-19 NOTE — Patient Instructions (Signed)
Continue coumadin 1 tablet daily except 1/2 tablet on Wednesdays.  Scheduled for hand surgery on 6/17.  Will hold warfarin 4 days before procedure per Dr Case.  Take last dose of warfarin on 6/12.  Restart warfarin day after procedure or per Dr Case. Continue greens Recheck in 2 weeks after procedure

## 2020-03-10 ENCOUNTER — Other Ambulatory Visit: Payer: Self-pay | Admitting: "Endocrinology

## 2020-03-11 ENCOUNTER — Ambulatory Visit (INDEPENDENT_AMBULATORY_CARE_PROVIDER_SITE_OTHER): Payer: Medicare Other | Admitting: *Deleted

## 2020-03-11 DIAGNOSIS — I4891 Unspecified atrial fibrillation: Secondary | ICD-10-CM | POA: Diagnosis not present

## 2020-03-11 DIAGNOSIS — Z5181 Encounter for therapeutic drug level monitoring: Secondary | ICD-10-CM

## 2020-03-11 DIAGNOSIS — I4821 Permanent atrial fibrillation: Secondary | ICD-10-CM | POA: Diagnosis not present

## 2020-03-11 LAB — POCT INR: INR: 1.8 — AB (ref 2.0–3.0)

## 2020-03-11 NOTE — Patient Instructions (Signed)
Take warfarin 1 1/2 tablets tonight then resume 1 tablet daily except 1/2 tablet on Wednesdays.   Continue greens Recheck in 3 weeks

## 2020-03-24 ENCOUNTER — Other Ambulatory Visit: Payer: Self-pay | Admitting: Cardiology

## 2020-03-24 LAB — COMPREHENSIVE METABOLIC PANEL
Albumin: 3.5 (ref 3.5–5.0)
Calcium: 8.2 — AB (ref 8.7–10.7)
GFR calc Af Amer: 55
GFR calc non Af Amer: 47

## 2020-03-24 LAB — VITAMIN D 25 HYDROXY (VIT D DEFICIENCY, FRACTURES): Vit D, 25-Hydroxy: 55.9

## 2020-03-24 LAB — BASIC METABOLIC PANEL
BUN: 23 — AB (ref 4–21)
CO2: 28 — AB (ref 13–22)
Chloride: 108 (ref 99–108)
Creatinine: 1.5 — AB (ref 0.6–1.3)
Glucose: 85
Potassium: 4 (ref 3.4–5.3)
Sodium: 141 (ref 137–147)

## 2020-03-24 LAB — MICROALBUMIN, URINE: Microalb, Ur: 2.8

## 2020-03-24 LAB — LIPID PANEL
Cholesterol: 142 (ref 0–200)
HDL: 40 (ref 35–70)
LDL Cholesterol: 81
Triglycerides: 107 (ref 40–160)

## 2020-03-24 LAB — HEPATIC FUNCTION PANEL
ALT: 15 (ref 10–40)
AST: 14 (ref 14–40)

## 2020-03-31 ENCOUNTER — Ambulatory Visit (INDEPENDENT_AMBULATORY_CARE_PROVIDER_SITE_OTHER): Payer: Medicare Other | Admitting: "Endocrinology

## 2020-03-31 ENCOUNTER — Other Ambulatory Visit: Payer: Self-pay | Admitting: Cardiology

## 2020-03-31 ENCOUNTER — Other Ambulatory Visit: Payer: Self-pay

## 2020-03-31 ENCOUNTER — Encounter: Payer: Self-pay | Admitting: "Endocrinology

## 2020-03-31 VITALS — BP 134/90 | HR 71 | Ht 70.5 in | Wt 228.8 lb

## 2020-03-31 DIAGNOSIS — Z794 Long term (current) use of insulin: Secondary | ICD-10-CM | POA: Diagnosis not present

## 2020-03-31 DIAGNOSIS — I10 Essential (primary) hypertension: Secondary | ICD-10-CM

## 2020-03-31 DIAGNOSIS — E1159 Type 2 diabetes mellitus with other circulatory complications: Secondary | ICD-10-CM

## 2020-03-31 DIAGNOSIS — E782 Mixed hyperlipidemia: Secondary | ICD-10-CM

## 2020-03-31 LAB — POCT GLYCOSYLATED HEMOGLOBIN (HGB A1C): Hemoglobin A1C: 6.9 % — AB (ref 4.0–5.6)

## 2020-03-31 MED ORDER — BASAGLAR KWIKPEN 100 UNIT/ML ~~LOC~~ SOPN: PEN_INJECTOR | SUBCUTANEOUS | 2 refills | Status: DC

## 2020-03-31 MED ORDER — METFORMIN HCL 500 MG PO TABS: ORAL_TABLET | ORAL | 1 refills | Status: DC

## 2020-03-31 NOTE — Patient Instructions (Signed)

## 2020-03-31 NOTE — Progress Notes (Signed)
03/31/2020  Endocrinology follow-up note   Subjective:    Patient ID: Daniel Schaefer, male    DOB: 1951-03-11,    Past Medical History:  Diagnosis Date  . Atrial fibrillation [facility])    Reportedly diagnosed November 2011  . Essential hypertension   . Prostate cancer [facility])    Radiation implants  . Type 2 diabetes mellitus ([place])    Past Surgical History:  Procedure Laterality Date  . Gold seed placement    . PROSTATE BIOPSY     Social History   Socioeconomic History  . Marital status: Divorced    Spouse name: Not on file  . Number of children: Not on file  . Years of education: Not on file  . Highest education level: Not on file  Occupational History  . Not on file  Tobacco Use  . Smoking status: Never Smoker  . Smokeless tobacco: Never Used  Vaping Use  . Vaping Use: Never used  Substance and Sexual Activity  . Alcohol use: No    Alcohol/week: 0.0 standard drinks  . Drug use: No  . Sexual activity: Not on file  Other Topics Concern  . Not on file  Social History Narrative  . Not on file   Social Determinants of Health   Financial Resource Strain:   . Difficulty of Paying Living Expenses:   Food Insecurity:   . Worried About Charity fundraiser in the Last Year:   . Arboriculturist in the Last Year:   Transportation Needs:   . Film/video editor (Medical):   Marland Kitchen Lack of Transportation (Non-Medical):   Physical Activity:   . Days of Exercise per Week:   . Minutes of Exercise per Session:   Stress:   . Feeling of Stress :   Social Connections:   . Frequency of Communication with Friends and Family:   . Frequency of Social Gatherings with Friends and Family:   . Attends Religious Services:   . Active Member of Clubs or Organizations:   . Attends Archivist Meetings:   Marland Kitchen Marital Status:    Outpatient Encounter Medications as of 03/31/2020  Medication Sig  . acetaminophen (TYLENOL) 325 MG tablet Take 650 mg by mouth as needed.  Marland Kitchen  amLODipine (NORVASC) 5 MG tablet Take 1 tablet by mouth once daily  . cetirizine (ZYRTEC) 10 MG tablet Take 10 mg by mouth daily.  . Cholecalciferol (VITAMIN D3 PO) Take 1 tablet by mouth daily.  . furosemide (LASIX) 20 MG tablet Take 1 tablet by mouth once daily  . GLOBAL EASE INJECT PEN NEEDLES 31G X 8 MM MISC USE 1 DAILY AS DIRECTED - RUN ON CASH $15 - PER PC.  Marland Kitchen glucose blood test strip Use as instructed  . Insulin Glargine (BASAGLAR KWIKPEN) 100 UNIT/ML INJECT 64 UNITS INTO THE SKIN AT BEDTIME  . Insulin Pen Needle (B-D ULTRAFINE III SHORT PEN) 31G X 8 MM MISC 1 each by Does not apply route as directed.  . Lancets MISC 1 each by Does not apply route as directed.  Marland Kitchen lisinopril (ZESTRIL) 20 MG tablet Take 1 tablet (20 mg total) by mouth daily.  . metFORMIN (GLUCOPHAGE) 500 MG tablet Daily after supper  . metoprolol tartrate (LOPRESSOR) 25 MG tablet Take 1 tablet (25 mg total) by mouth 2 (two) times daily.  Marland Kitchen omeprazole (PRILOSEC) 20 MG capsule Take 20 mg by mouth daily.  . potassium chloride (KLOR-CON) 10 MEQ tablet Take 1 tablet by mouth once  daily  . pravastatin (PRAVACHOL) 40 MG tablet Take 1 tablet (40 mg total) by mouth at bedtime.  . tadalafil (CIALIS) 20 MG tablet Take 20 mg by mouth daily as needed for erectile dysfunction.  . tamsulosin (FLOMAX) 0.4 MG CAPS capsule Take 0.4 mg by mouth daily.   Marland Kitchen warfarin (COUMADIN) 5 MG tablet Take 1 tablet daily except 1/2 tablet on Wednesdays or as directed (Patient taking differently: Take 1 tablet daily except 1/2 tablet on Wednesdays or as directed - managed by coumadin clinic)  . [DISCONTINUED] Insulin Glargine (BASAGLAR KWIKPEN) 100 UNIT/ML SOPN INJECT 60 UNITS INTO THE SKIN AT BEDTIME  . [DISCONTINUED] Insulin Glargine (BASAGLAR KWIKPEN) 100 UNIT/ML INJECT 64 UNITS INTO THE SKIN AT BEDTIME  . [DISCONTINUED] metFORMIN (GLUCOPHAGE) 500 MG tablet TAKE 1 TABLET BY MOUTH TWICE DAILY WITH  A  MEAL   No facility-administered encounter medications  on file as of 03/31/2020.   ALLERGIES: Allergies  Allergen Reactions  . Bactrim [Sulfamethoxazole-Trimethoprim]   . Other     Cannot mix warfarin and bactrim   . [name] Flextouch [Insulin Degludec] Rash   VACCINATION STATUS:  There is no immunization history on file for this patient.  Diabetes He presents for his follow-up diabetic visit. He has type 2 diabetes mellitus. Onset time: He was diagnosed at approximate age of 59 years. His disease course has been stable. There are no hypoglycemic associated symptoms. There are no diabetic associated symptoms. There are no hypoglycemic complications. Symptoms are stable. There are no diabetic complications. Risk factors for coronary artery disease include hypertension, dyslipidemia and sedentary lifestyle. Current diabetic treatment includes oral agent (dual therapy). He is compliant with treatment most of the time. His weight is increasing steadily. He is following a generally unhealthy diet. His home blood glucose trend is fluctuating minimally. His breakfast blood glucose range is generally 140-180 mg/dl. His bedtime blood glucose range is generally 140-180 mg/dl. His overall blood glucose range is 140-180 mg/dl. (He presents with stable glycemic profile at fasting, point-of-care A1c is 6.9%.  Did not document or report hypoglycemia.) An ACE inhibitor/angiotensin II receptor blocker is being taken.  Hypertension This is a chronic problem. The current episode started more than 1 year ago. Risk factors for coronary artery disease include diabetes mellitus, dyslipidemia, male gender, sedentary lifestyle and obesity. Past treatments include ACE inhibitors and calcium channel blockers.  Hyperlipidemia This is a chronic problem. The current episode started more than 1 year ago. Exacerbating diseases include diabetes. Current antihyperlipidemic treatment includes statins. Risk factors for coronary artery disease include dyslipidemia, diabetes mellitus,  hypertension, male sex, a sedentary lifestyle and obesity.   Review of systems: Limited as above   Objective:    BP 134/90   Pulse 71   Ht 5' 10.5" (1.791 m)   Wt 228 lb 12.8 oz (103.8 kg)   BMI 32.37 kg/m   Wt Readings from Last 3 Encounters:  03/31/20 228 lb 12.8 oz (103.8 kg)  01/09/20 228 lb 9.6 oz (103.7 kg)  06/21/19 219 lb (99.3 kg)     Physical Exam- Limited  Constitutional:  Body mass index is 32.37 kg/m. , not in acute distress, normal state of mind Eyes:  EOMI, no exophthalmos Neck: Supple Thyroid: No gross goiter Respiratory: Adequate breathing efforts Musculoskeletal: no gross deformities, strength intact in all four extremities, no gross restriction of joint movements Skin:  no rashes, no hyperemia Neurological: no tremor with outstretched hands,    Results for orders placed or performed in visit on  03/31/20  HgB A1c  Result Value Ref Range   Hemoglobin A1C 6.9 (A) 4.0 - 5.6 %   HbA1c POC (<> result, manual entry)     HbA1c, POC (prediabetic range)     HbA1c, POC (controlled diabetic range)     Complete Blood Count (Most recent): Lab Results  Component Value Date   WBC 6.6 08/31/2013   HGB 14.0 08/31/2013   HCT 40.1 08/31/2013   MCV 75.9 (L) 08/31/2013   PLT 235 08/31/2013   Chemistry (most recent): Lab Results  Component Value Date   NA 141 03/24/2020   K 4.0 03/24/2020   CL 108 03/24/2020   CO2 28 (A) 03/24/2020   BUN 23 (A) 03/24/2020   CREATININE 1.5 (A) 03/24/2020   Lipid Panel     Component Value Date/Time   CHOL 142 03/24/2020 0000   TRIG 107 03/24/2020 0000   HDL 40 03/24/2020 0000   LDLCALC 81 03/24/2020 0000    Assessment & Plan:   1. Type 2 diabetes mellitus with stage 3 renal insufficiency, with long-term current use of insulin ([place])  He presents with stable glycemic profile at fasting, point-of-care A1c is 6.9%.  Did not document or report hypoglycemia.  -Patient remains at a high risk for more acute and chronic  complications of diabetes which include CAD, CVA, CKD, retinopathy, and neuropathy. These are all discussed in detail with the patient. -I have re-counseled the patient on diet management and weight loss, by adopting a carbohydrate restricted / protein rich  Diet. Patient is advised to stick to a routine mealtimes to eat 3 meals  a day and avoid unnecessary snacks ( to snack only to correct hypoglycemia).  - he  admits there is a room for improvement in his diet and drink choices. -  Suggestion is made for him to avoid simple carbohydrates  from his diet including Cakes, Sweet Desserts / Pastries, Ice Cream, Soda (diet and regular), Sweet Tea, Candies, Chips, Cookies, Sweet Pastries,  Store Bought Juices, Alcohol in Excess of  1-2 drinks a day, Artificial Sweeteners, Coffee Creamer, and "Sugar-free" Products. This will help patient to have stable blood glucose profile and potentially avoid unintended weight gain.   -He is following with Jearld Fenton, CDE for diabetes education.  - He is  willing to continue on basal insulin. - He is getting more comfortable injecting insulin, currently Basaglar.  He is advised to increase Basaglar to 64 units nightly, associated with strict monitoring of blood glucose 2 times a day-daily before breakfast and, and at bedtime.    -Patient is encouraged to call clinic for blood glucose levels less than 70 or above 150 mg /dl x 3. -He expresses concern of cost of medications including insulin.  -He is advised to lower Metformin to 500 mg p.o. daily only after supper .  - He is generally hesitant and reluctant to add medications. - Patient is not a suitable candidate for incretin therapy  Nor SGLT2i.  - Patient specific target  for A1c; LDL, HDL, Triglycerides, and  Waist Circumference were discussed in detail.  2) BP/HTN: His blood pressure is controlled to target.    He is advised to continue his blood pressure medications including lisinopril 20 mg p.o.  daily.   3) Lipids/HPL: His recent lipid panel showed elevated LDL of 81 from 56.  He is advised to be consistent with his pravastatin 40 mg p.o. daily at bedtime.   4)  Weight/Diet: His BMI 32.3-candidate for some weight  loss.  He is following with Jearld Fenton, CD for diabetes education.  No success in weight loss,  exercise, and carbohydrates information provided.  5) Chronic Care/Health Maintenance:  -Patient on ACEI and Statin medications and encouraged to continue to follow up with Ophthalmology, Podiatrist at least yearly or according to recommendations, and advised to stay away from smoking. I have recommended yearly flu vaccine and pneumonia vaccination at least every 5 years; moderate intensity exercise for up to 150 minutes weekly; and  sleep for at least 7 hours a day.   He is advised to maintain close follow-up with his PMD Dr. Huel Cote. - Time spent on this patient care encounter:  35 min, of which > 50% was spent in  counseling and the rest reviewing his blood glucose logs , discussing his hypoglycemia and hyperglycemia episodes, reviewing his current and  previous labs / studies  ( including abstraction from other facilities) and medications  doses and developing a  long term treatment plan and documenting his care.   Please refer to Patient Instructions for Blood Glucose Monitoring and Insulin/Medications Dosing Guide"  in media tab for additional information. Please  also refer to " Patient Self Inventory" in the Media  tab for reviewed elements of pertinent patient history.  Daniel Schaefer participated in the discussions, expressed understanding, and voiced agreement with the above plans.  All questions were answered to his satisfaction. he is encouraged to contact clinic should he have any questions or concerns prior to his return visit.   Follow up plan: Return in about 6 months (around 10/01/2020) for F/U with Pre-visit Labs, Meter, Logs, A1c here.Glade Lloyd,  MD Phone: [phone]  Fax: [phone]  -  This note was partially dictated with voice recognition software. Similar sounding words can be transcribed inadequately or may not  be corrected upon review.  03/31/2020, 12:55 PM

## 2020-04-01 ENCOUNTER — Ambulatory Visit (INDEPENDENT_AMBULATORY_CARE_PROVIDER_SITE_OTHER): Payer: Medicare Other | Admitting: *Deleted

## 2020-04-01 DIAGNOSIS — Z5181 Encounter for therapeutic drug level monitoring: Secondary | ICD-10-CM | POA: Diagnosis not present

## 2020-04-01 DIAGNOSIS — I4891 Unspecified atrial fibrillation: Secondary | ICD-10-CM | POA: Diagnosis not present

## 2020-04-01 DIAGNOSIS — I4821 Permanent atrial fibrillation: Secondary | ICD-10-CM

## 2020-04-01 LAB — POCT INR: INR: 2.2 (ref 2.0–3.0)

## 2020-04-01 NOTE — Patient Instructions (Signed)
Continue warfarin 1 tablet daily except 1/2 tablet on Wednesdays.   °Continue greens °Recheck in 4 weeks  °

## 2020-04-18 IMAGING — US ULTRASOUND ABDOMEN COMPLETE
1 series · 13 of 25 positions shown · non-contrast
Comparison: CT 02/04/2016

CLINICAL DATA: Renal stones.  Cholelithiasis.

EXAM:
ABDOMEN ULTRASOUND COMPLETE

[Series 1: ultrasound abdomen complete · 13 of 110 slices shown]
[im 1/110]
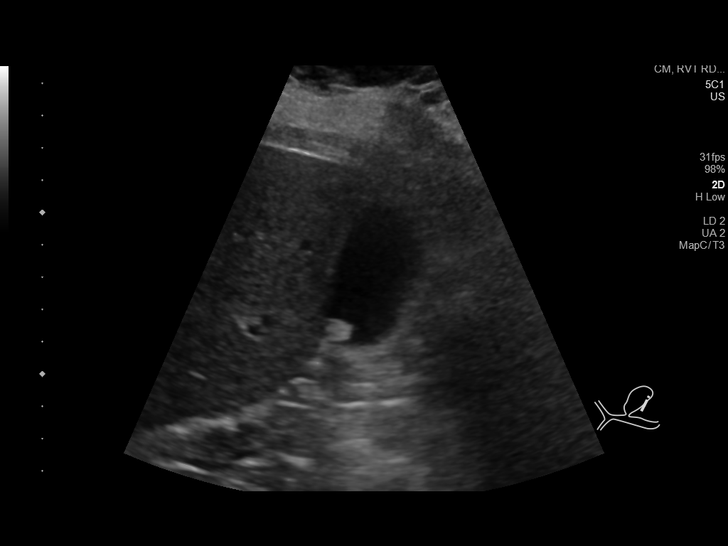
[im 10/110]
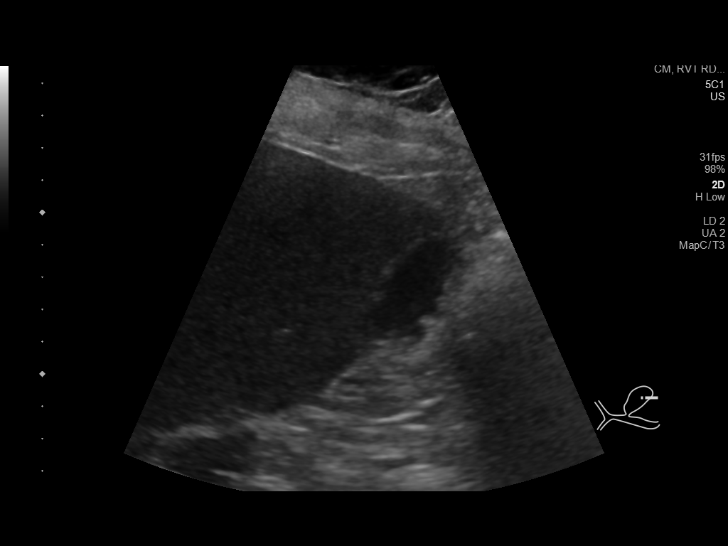
[im 19/110]
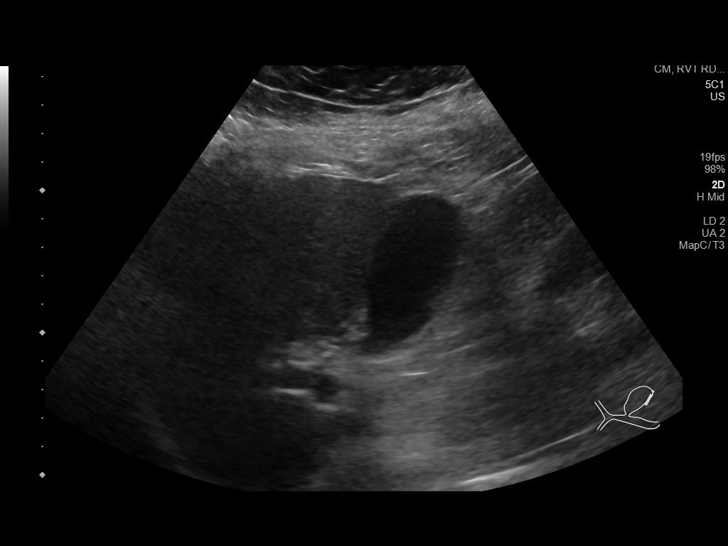
[im 28/110]
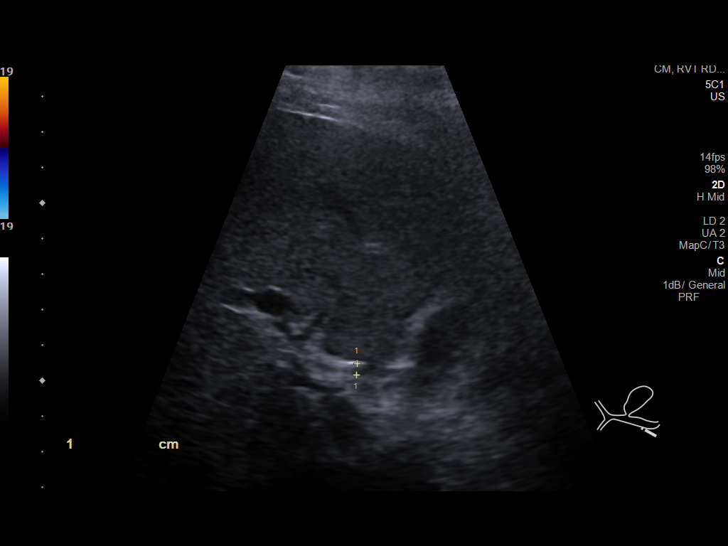
[im 37/110]
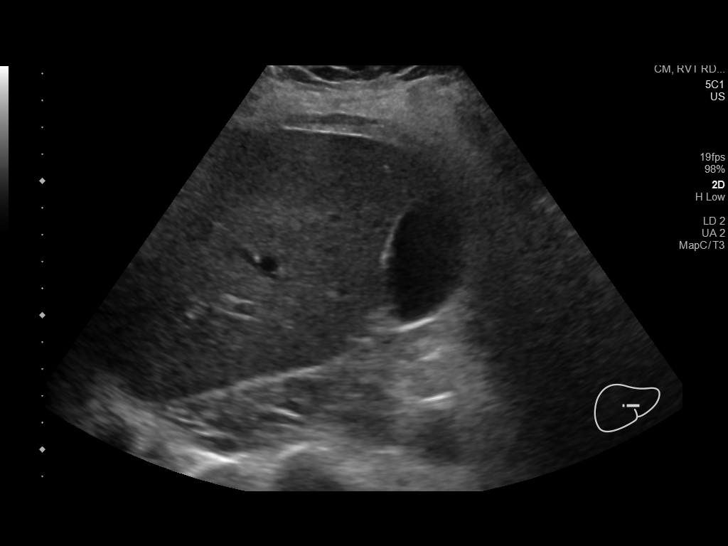
[im 46/110]
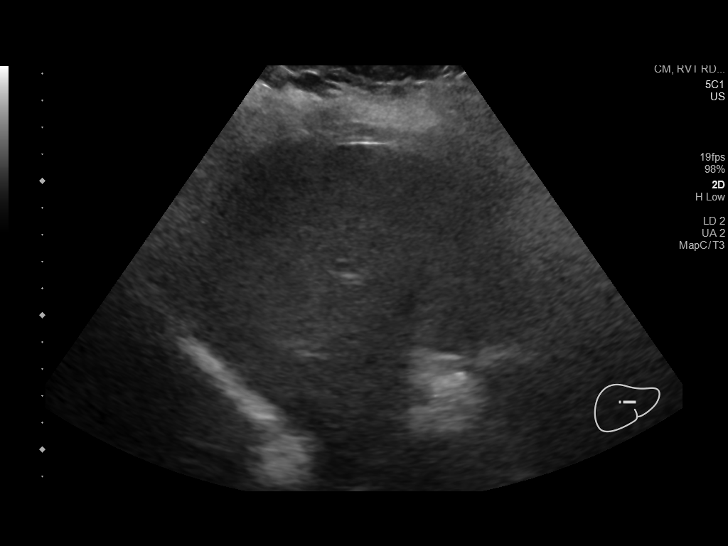
[im 55/110]
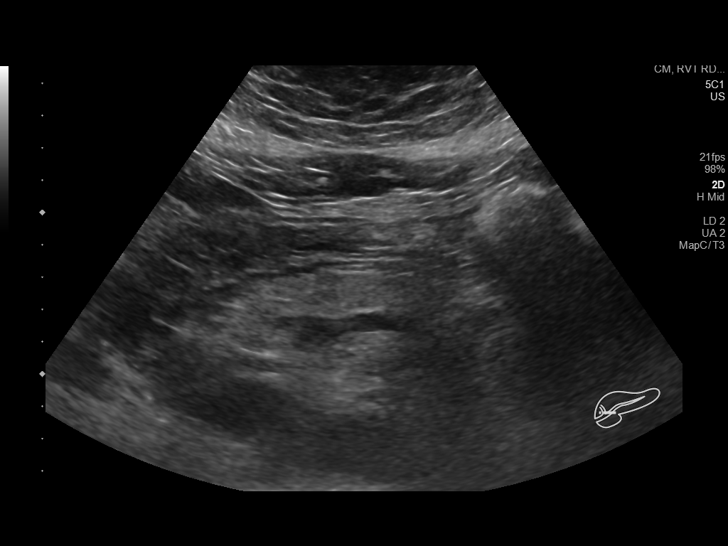
[im 64/110]
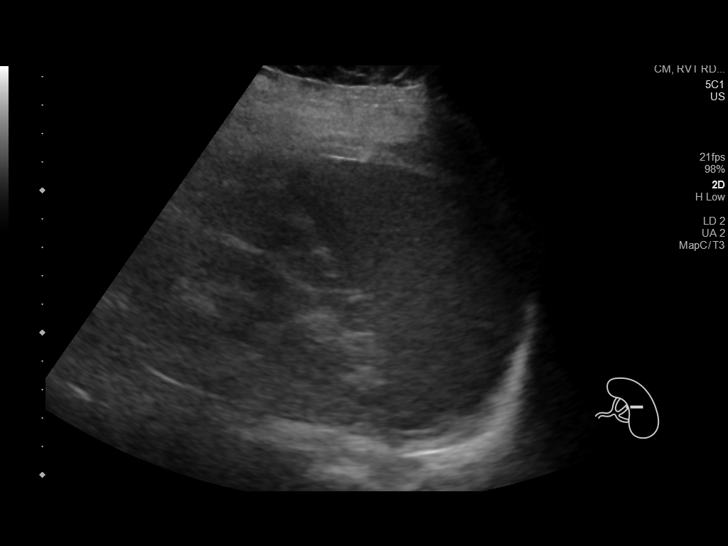
[im 73/110]
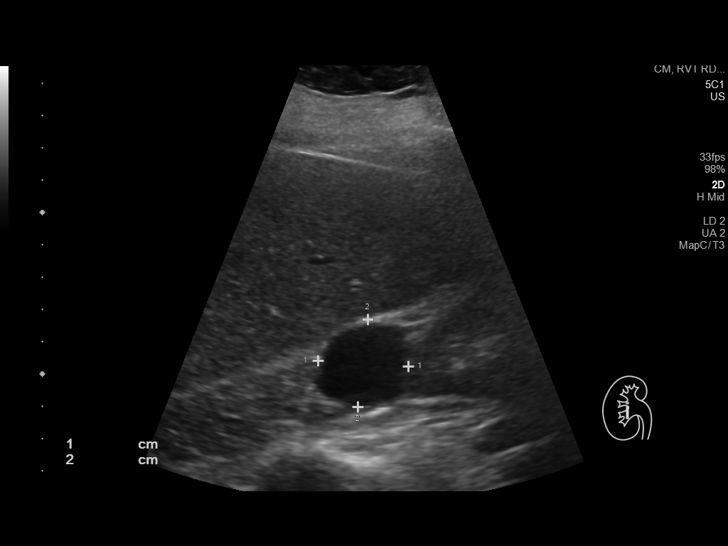
[im 82/110]
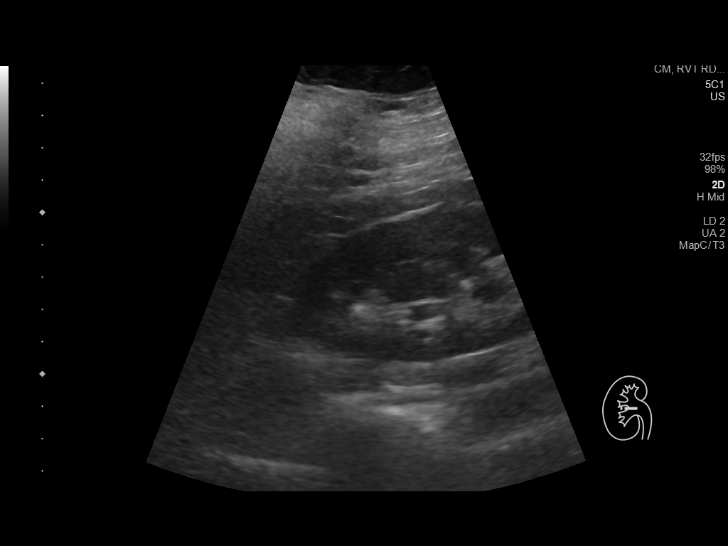
[im 91/110]
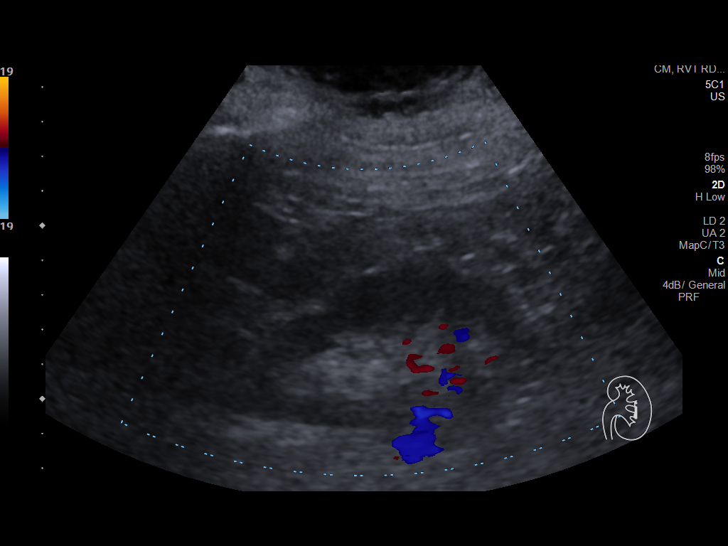
[im 100/110]
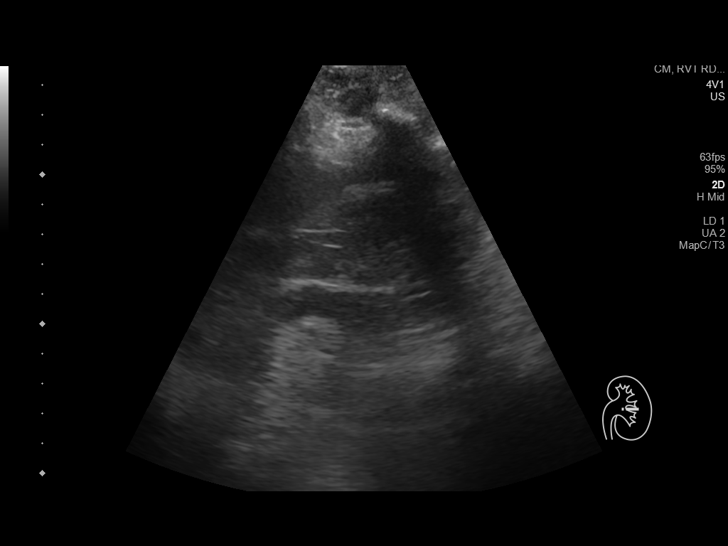
[im 110/110]
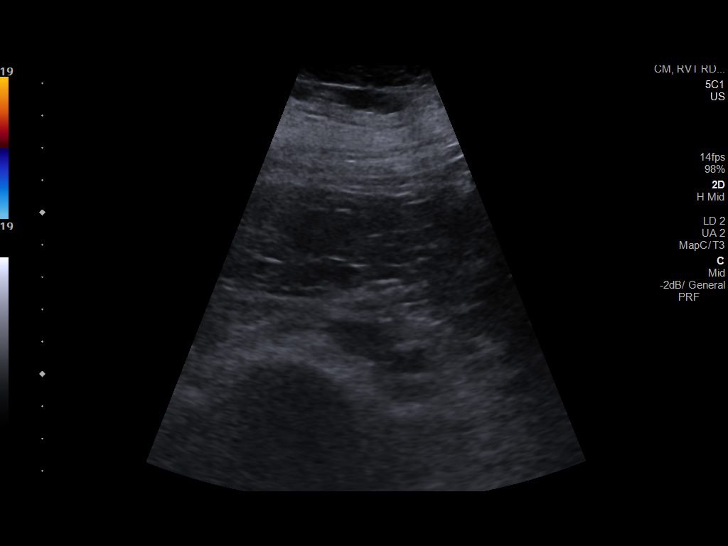

[13 of 25 positions shown; findings below may reference images not displayed]

FINDINGS: Gallbladder: Mild to moderate cholelithiasis without gallbladder
wall thickening. Negative sonographic Murphy sign. No adjacent free
fluid. Largest stone measures 8-9 mm.

Common bile duct: Diameter: 3.2 mm.

Liver: No focal lesion identified. Within normal limits in
parenchymal echogenicity. Portal vein is patent on color Doppler
imaging with normal direction of blood flow towards the liver.

IVC: No abnormality visualized.

Pancreas: Visualized portion unremarkable.

Spleen: Size and appearance within normal limits.

Right Kidney: Length: 11.7 cm. Echogenicity within normal limits. No
mass or hydronephrosis visualized. 2.8 cm cyst over the upper pole.
Possible 1.1 cm stone which is nonobstructive.

Left Kidney: Length: 11.5 cm. Echogenicity within normal limits. No
mass or hydronephrosis visualized. 5 mm stone over the lower pole.

Abdominal aorta: No aneurysm visualized.

Other findings: No free fluid.
IMPRESSION: No acute findings.

Mild to moderate cholelithiasis with largest stone 8-9 mm. No
additional sonographic evidence of cholecystitis.

Bilateral nephrolithiasis. No obstruction. 2.8 cm right renal cyst.

## 2020-04-24 ENCOUNTER — Other Ambulatory Visit: Payer: Self-pay | Admitting: Cardiology

## 2020-04-28 ENCOUNTER — Telehealth: Payer: Self-pay | Admitting: *Deleted

## 2020-04-28 ENCOUNTER — Other Ambulatory Visit: Payer: Self-pay

## 2020-04-28 DIAGNOSIS — Z8546 Personal history of malignant neoplasm of prostate: Secondary | ICD-10-CM

## 2020-04-28 DIAGNOSIS — N200 Calculus of kidney: Secondary | ICD-10-CM

## 2020-04-28 NOTE — Telephone Encounter (Signed)
Pt has not been tested for COVID.  INR's have been stable.  Will reschedule pt out 21 days from onset (8/4) to be safe due to current symptoms.  Pt in agreement.

## 2020-04-28 NOTE — Telephone Encounter (Signed)
Patient called stating that 04/16/2020 he woke up with fever, no taste, no smell. He did not go to the doctor. He is still having congestion. Wanting to know about coming in for coumdin check. [phone].

## 2020-04-30 MED ORDER — TAMSULOSIN HCL 0.4 MG PO CAPS: 0.4000 mg | ORAL_CAPSULE | Freq: Every day | ORAL | 2 refills | Status: DC

## 2020-05-13 ENCOUNTER — Ambulatory Visit (INDEPENDENT_AMBULATORY_CARE_PROVIDER_SITE_OTHER): Payer: Medicare Other | Admitting: *Deleted

## 2020-05-13 ENCOUNTER — Other Ambulatory Visit: Payer: Self-pay

## 2020-05-13 DIAGNOSIS — Z5181 Encounter for therapeutic drug level monitoring: Secondary | ICD-10-CM | POA: Diagnosis not present

## 2020-05-13 DIAGNOSIS — I4821 Permanent atrial fibrillation: Secondary | ICD-10-CM

## 2020-05-13 LAB — POCT INR: INR: 2.2 (ref 2.0–3.0)

## 2020-05-13 NOTE — Patient Instructions (Signed)
Continue warfarin 1 tablet daily except 1/2 tablet on Wednesdays.   °Continue greens °Recheck in 4 weeks  °

## 2020-06-02 ENCOUNTER — Telehealth: Payer: Self-pay

## 2020-06-02 ENCOUNTER — Other Ambulatory Visit: Payer: Self-pay

## 2020-06-02 DIAGNOSIS — N5089 Other specified disorders of the male genital organs: Secondary | ICD-10-CM

## 2020-06-02 NOTE — Telephone Encounter (Signed)
See note to MD.

## 2020-06-02 NOTE — Telephone Encounter (Signed)
A scrotal US would be helpful.   Thanks.

## 2020-06-02 NOTE — Telephone Encounter (Signed)
Order placed. Message sent via my chart for pt

## 2020-06-02 NOTE — Telephone Encounter (Signed)
Pt called and made aware as well.

## 2020-06-09 ENCOUNTER — Ambulatory Visit (HOSPITAL_COMMUNITY)
Admission: RE | Admit: 2020-06-09 | Discharge: 2020-06-09 | Disposition: A | Discharge status: Home / Self Care | Payer: Medicare Other | Source: Ambulatory Visit | Attending: Urology | Admitting: Urology

## 2020-06-09 ENCOUNTER — Other Ambulatory Visit: Payer: Medicare Other

## 2020-06-09 ENCOUNTER — Other Ambulatory Visit: Payer: Self-pay

## 2020-06-09 DIAGNOSIS — N200 Calculus of kidney: Secondary | ICD-10-CM | POA: Insufficient documentation

## 2020-06-09 DIAGNOSIS — Z8546 Personal history of malignant neoplasm of prostate: Secondary | ICD-10-CM

## 2020-06-10 LAB — PSA: Prostate Specific Ag, Serum: 0.3 ng/mL (ref 0.0–4.0)

## 2020-06-11 ENCOUNTER — Other Ambulatory Visit: Payer: Self-pay

## 2020-06-11 ENCOUNTER — Ambulatory Visit (HOSPITAL_COMMUNITY)
Admission: RE | Admit: 2020-06-11 | Discharge: 2020-06-11 | Disposition: A | Discharge status: Home / Self Care | Payer: Medicare Other | Source: Ambulatory Visit | Attending: Urology | Admitting: Urology

## 2020-06-11 DIAGNOSIS — N5089 Other specified disorders of the male genital organs: Secondary | ICD-10-CM | POA: Diagnosis not present

## 2020-06-13 ENCOUNTER — Encounter: Payer: Self-pay | Admitting: Urology

## 2020-06-13 ENCOUNTER — Other Ambulatory Visit: Payer: Self-pay

## 2020-06-13 ENCOUNTER — Ambulatory Visit (INDEPENDENT_AMBULATORY_CARE_PROVIDER_SITE_OTHER): Payer: Medicare Other | Admitting: Urology

## 2020-06-13 VITALS — BP 152/87 | HR 65 | Temp 98.5°F | Ht 70.5 in | Wt 229.0 lb

## 2020-06-13 DIAGNOSIS — R9721 Rising PSA following treatment for malignant neoplasm of prostate: Secondary | ICD-10-CM | POA: Diagnosis not present

## 2020-06-13 DIAGNOSIS — N200 Calculus of kidney: Secondary | ICD-10-CM

## 2020-06-13 DIAGNOSIS — N500 Atrophy of testis: Secondary | ICD-10-CM | POA: Diagnosis not present

## 2020-06-13 DIAGNOSIS — Z8546 Personal history of malignant neoplasm of prostate: Secondary | ICD-10-CM | POA: Diagnosis not present

## 2020-06-13 DIAGNOSIS — N503 Cyst of epididymis: Secondary | ICD-10-CM

## 2020-06-13 DIAGNOSIS — K4090 Unilateral inguinal hernia, without obstruction or gangrene, not specified as recurrent: Secondary | ICD-10-CM

## 2020-06-13 LAB — URINALYSIS, ROUTINE W REFLEX MICROSCOPIC
Bilirubin, UA: NEGATIVE
Ketones, UA: NEGATIVE
Leukocytes,UA: NEGATIVE
Nitrite, UA: NEGATIVE
Protein,UA: NEGATIVE
RBC, UA: NEGATIVE
Specific Gravity, UA: 1.015 (ref 1.005–1.030)
Urobilinogen, Ur: 0.2 mg/dL (ref 0.2–1.0)
pH, UA: 5 (ref 5.0–7.5)

## 2020-06-13 NOTE — Progress Notes (Signed)
Subjective:  1. Personal history of malignant neoplasm of prostate   2. Rising PSA following treatment for malignant neoplasm of prostate   3. Renal stones   4. Testicular atrophy   5. Epididymal cyst   6. Non-recurrent inguinal hernia without obstruction or gangrene, unspecified laterality     I have kidney stones.  HPI: Daniel Schaefer is a 69 year-old male established patient who is here for renal calculi.  Mr. Daniel Schaefer returns today in f/u for stones. His KUB showed an increase in size of the LLP stone that was 50mm last year and is now 75mm.  He has a couple of smaller LLP. He has a 62mm calcification in the RUQ that is felt to be a gallstone. He has had no further left flank pain. He has had no gross hematuria.   He has a history of prostate cancer treated with EXRT and seeds in 2003. His PSA remains low at 0.3 but it was 0.18 last year. He is voiding ok but his stream is reduced. His IPSS is 7 with nocturia x 2. He is on tamsulosin.   He has ED and had tried sildenafil but it didn't work well for him. He likes cialis  but it is too expensive so he was given generic tadalafil at his last visit. He can have a partial am erection but sex is difficult.   He called complaining of a mass on the left testicle so a scrotal US was obtained.   He has a left inguinal hernia and a small left hydrocele.  He has some testicular atrophy and tubular ectasia of the epididymis.        IPSS    Row Name 06/13/20 1500         International Prostate Symptom Score   How often have you had the sensation of not emptying your bladder? Less than 1 in 5     How often have you had to urinate less than every two hours? Less than half the time     How often have you found you stopped and started again several times when you urinated? Not at All     How often have you found it difficult to postpone urination? About half the time     How often have you had a weak urinary stream? Almost always     How often  have you had to strain to start urination? Not at All     How many times did you typically get up at night to urinate? 2 Times     Total IPSS Score 13       Quality of Life due to urinary symptoms   If you were to spend the rest of your life with your urinary condition just the way it is now how would you feel about that? Mostly Disatisfied            ROS:  ROS:  A complete review of systems was performed.  All systems are negative except for pertinent findings as noted.   ROS  Allergies  Allergen Reactions  . Bactrim [Sulfamethoxazole-Trimethoprim]   . Other     Cannot mix warfarin and bactrim   . [name] Flextouch [Insulin Degludec] Rash    Outpatient Encounter Medications as of 06/13/2020  Medication Sig  . acetaminophen (TYLENOL) 325 MG tablet Take 650 mg by mouth as needed.  Marland Kitchen amLODipine (NORVASC) 5 MG tablet Take 1 tablet by mouth once daily  . cetirizine (ZYRTEC) 10 MG tablet  Take 10 mg by mouth daily.  . Cholecalciferol (VITAMIN D3 PO) Take 1 tablet by mouth daily.  . furosemide (LASIX) 20 MG tablet Take 1 tablet by mouth once daily  . GLOBAL EASE INJECT PEN NEEDLES 31G X 8 MM MISC USE 1 DAILY AS DIRECTED - RUN ON CASH $15 - PER PC.  Marland Kitchen glucose blood test strip Use as instructed  . Insulin Glargine (BASAGLAR KWIKPEN) 100 UNIT/ML INJECT 64 UNITS INTO THE SKIN AT BEDTIME  . Insulin Pen Needle (B-D ULTRAFINE III SHORT PEN) 31G X 8 MM MISC 1 each by Does not apply route as directed.  . Lancets MISC 1 each by Does not apply route as directed.  Marland Kitchen lisinopril (ZESTRIL) 20 MG tablet Take 1 tablet by mouth once daily  . metFORMIN (GLUCOPHAGE) 500 MG tablet Daily after supper  . metFORMIN (GLUCOPHAGE) 500 MG tablet daily.  . metoprolol tartrate (LOPRESSOR) 25 MG tablet Take 1 tablet (25 mg total) by mouth 2 (two) times daily.  . metoprolol tartrate (LOPRESSOR) 25 MG tablet Take 1 tablet by mouth 2 (two) times daily.  Marland Kitchen omeprazole (PRILOSEC) 20 MG capsule Take 20 mg by mouth  daily.  Marland Kitchen omeprazole (PRILOSEC) 20 MG capsule TAKE 1 CAPSULE BY MOUTH ONCE DAILY ON AN EMPTY STOMACH AND WAIT 45 MINUTES BEFORE EATING  . potassium chloride (KLOR-CON) 10 MEQ tablet Take 1 tablet by mouth once daily  . pravastatin (PRAVACHOL) 40 MG tablet Take 1 tablet (40 mg total) by mouth at bedtime.  . tadalafil (CIALIS) 20 MG tablet Take 20 mg by mouth daily as needed for erectile dysfunction.  . tamsulosin (FLOMAX) 0.4 MG CAPS capsule Take 1 capsule (0.4 mg total) by mouth daily.  . tamsulosin (FLOMAX) 0.4 MG CAPS capsule Take by mouth.  . warfarin (COUMADIN) 5 MG tablet Take 1 tablet daily except 1/2 tablet on Wednesdays or as directed (Patient taking differently: Take 1 tablet daily except 1/2 tablet on Wednesdays or as directed - managed by coumadin clinic)   No facility-administered encounter medications on file as of 06/13/2020.    Past Medical History:  Diagnosis Date  . Atrial fibrillation [facility])    Reportedly diagnosed November 2011  . Essential hypertension   . Prostate cancer [facility])    Radiation implants  . Type 2 diabetes mellitus ([place])     Past Surgical History:  Procedure Laterality Date  . Gold seed placement    . LITHOTRIPSY    . PROSTATE BIOPSY    . URETEROSCOPY WITH HOLMIUM LASER LITHOTRIPSY      Social History   Socioeconomic History  . Marital status: Divorced    Spouse name: Not on file  . Number of children: Not on file  . Years of education: Not on file  . Highest education level: Not on file  Occupational History  . Not on file  Tobacco Use  . Smoking status: Never Smoker  . Smokeless tobacco: Never Used  Vaping Use  . Vaping Use: Never used  Substance and Sexual Activity  . Alcohol use: No    Alcohol/week: 0.0 standard drinks  . Drug use: No  . Sexual activity: Not on file  Other Topics Concern  . Not on file  Social History Narrative  . Not on file   Social Determinants of Health   Financial Resource Strain:   . Difficulty of  Paying Living Expenses: Not on file  Food Insecurity:   . Worried About Charity fundraiser in the Last Year: Not  on file  . Ran Out of Food in the Last Year: Not on file  Transportation Needs:   . Lack of Transportation (Medical): Not on file  . Lack of Transportation (Non-Medical): Not on file  Physical Activity:   . Days of Exercise per Week: Not on file  . Minutes of Exercise per Session: Not on file  Stress:   . Feeling of Stress : Not on file  Social Connections:   . Frequency of Communication with Friends and Family: Not on file  . Frequency of Social Gatherings with Friends and Family: Not on file  . Attends Religious Services: Not on file  . Active Member of Clubs or Organizations: Not on file  . Attends Archivist Meetings: Not on file  . Marital Status: Not on file  Intimate Partner Violence:   . Fear of Current or Ex-Partner: Not on file  . Emotionally Abused: Not on file  . Physically Abused: Not on file  . Sexually Abused: Not on file    Family History  Problem Relation Age of Onset  . Cancer Father 51       Laryngeal  . Lymphoma Mother 57       Objective: Vitals:   06/13/20 1517  BP: (!) 152/87  Pulse: 65  Temp: 98.5 F (36.9 C)     Physical Exam Vitals reviewed.  Constitutional:      Appearance: Normal appearance. He is obese.  Abdominal:     Palpations: Abdomen is soft.     Hernia: A hernia (Left inguinal hernia with scrotal component) is present.  Genitourinary:    Comments: Normal phallus. Scrotum normal but slightly enlarged on the left with a hernia protruding into the upper scrotum. Testicle with bilateral atrophy. Epididymis are beaded.  Neurological:     Mental Status: He is alert.     Lab Results:  Results for orders placed or performed in visit on 06/13/20 (from the past 24 hour(s))  Urinalysis, Routine w reflex microscopic     Status: Abnormal   Collection Time: 06/13/20  3:05 PM  Result Value Ref Range    Specific Gravity, UA 1.015 1.005 - 1.030   pH, UA 5.0 5.0 - 7.5   Color, UA Yellow Yellow   Appearance Ur Clear Clear   Leukocytes,UA Negative Negative   Protein,UA Negative Negative/Trace   Glucose, UA 3+ (A) Negative   Ketones, UA Negative Negative   RBC, UA Negative Negative   Bilirubin, UA Negative Negative   Urobilinogen, Ur 0.2 0.2 - 1.0 mg/dL   Nitrite, UA Negative Negative   Microscopic Examination Comment    Narrative   Performed at:  [facility] [address] [place], [place]  [id number] Lab Director: Knox, Phone:  [phone]    BMET No results for input(s): NA, K, CL, CO2, GLUCOSE, BUN, CREATININE, CALCIUM in the last 72 hours. PSA PSA 0.3 prior to this visit.  UA is clear.   KUB and scrotal US reviewed.   Studies/Results: No results found.    Assessment & Plan:  History of prostate cancer.   His PSA is up slightly.  I will repeat in 3 months.  Left inguinal hernia.   He is symptomatic so I will have him see Dr. Arnoldo Morale.  Testicular atrophy.  I will check a testosterone level in 3 months with the PSA.  Renal stone.  He has interval growth and will probably need treatment with ESWL or URS but would like to  deal with the hernia first.  I will repeat a KUB at f/u.    No orders of the defined types were placed in this encounter.    Orders Placed This Encounter  Procedures  . DG Abd 1 View    Standing Status:   Future    Standing Expiration Date:   10/14/2020    Order Specific Question:   Reason for Exam (SYMPTOM  OR DIAGNOSIS REQUIRED)    Answer:   left renal stone    Order Specific Question:   Preferred imaging location?    Answer:   [facility]    Order Specific Question:   Radiology Contrast Protocol - do NOT remove file path    Answer:   [email or web address]  . Urinalysis, Routine w reflex microscopic  . PSA    Standing Status:   Future    Standing Expiration  Date:   10/14/2020  . Testosterone    Standing Status:   Future    Standing Expiration Date:   10/14/2020  . Ambulatory referral to General Surgery    Referral Priority:   Urgent    Referral Type:   Surgical    Referral Reason:   Specialty Services Required    Referred to Provider:   Aviva Signs, MD    Requested Specialty:   General Surgery    Number of Visits Requested:   1      Return in about 3 months (around 09/13/2020) for with labs.   CC: Leeanne Rio, MD      Irine Seal 06/13/2020

## 2020-06-13 NOTE — Progress Notes (Signed)
Urological Symptom Review  Patient is experiencing the following symptoms: Weak stream  Erection Problems Kidney stone    Review of Systems  Gastrointestinal (upper)  : Negative for upper GI symptoms  Gastrointestinal (lower) : Negative for lower GI symptoms  Constitutional : Negative for symptoms  Skin: Negative for skin symptoms  Eyes: Negative for eye symptoms  Ear/Nose/Throat : Sinus problems  Hematologic/Lymphatic: Negative for Hematologic/Lymphatic symptoms  Cardiovascular : Negative for cardiovascular symptoms  Respiratory : Negative for respiratory symptoms  Endocrine: Negative for endocrine symptoms  Musculoskeletal: Negative for musculoskeletal symptoms  Neurological: Negative for neurological symptoms  Psychologic: Negative for psychiatric symptoms

## 2020-06-18 ENCOUNTER — Ambulatory Visit (INDEPENDENT_AMBULATORY_CARE_PROVIDER_SITE_OTHER): Payer: Medicare Other | Admitting: *Deleted

## 2020-06-18 DIAGNOSIS — I4821 Permanent atrial fibrillation: Secondary | ICD-10-CM

## 2020-06-18 DIAGNOSIS — I4891 Unspecified atrial fibrillation: Secondary | ICD-10-CM

## 2020-06-18 DIAGNOSIS — Z5181 Encounter for therapeutic drug level monitoring: Secondary | ICD-10-CM

## 2020-06-18 LAB — POCT INR: INR: 2.2 (ref 2.0–3.0)

## 2020-06-18 NOTE — Patient Instructions (Signed)
Continue warfarin 1 tablet daily except 1/2 tablet on Wednesdays.   °Continue greens °Recheck in 4 weeks  °

## 2020-06-19 ENCOUNTER — Encounter: Payer: Self-pay | Admitting: General Surgery

## 2020-06-19 ENCOUNTER — Ambulatory Visit (INDEPENDENT_AMBULATORY_CARE_PROVIDER_SITE_OTHER): Payer: Medicare Other | Admitting: General Surgery

## 2020-06-19 ENCOUNTER — Other Ambulatory Visit: Payer: Self-pay

## 2020-06-19 VITALS — BP 117/74 | HR 61 | Temp 98.4°F | Resp 16 | Ht 70.5 in | Wt 226.0 lb

## 2020-06-19 DIAGNOSIS — K4090 Unilateral inguinal hernia, without obstruction or gangrene, not specified as recurrent: Secondary | ICD-10-CM

## 2020-06-19 NOTE — Patient Instructions (Addendum)
Stop Warfarin four days before procedure  Inguinal Hernia, Adult An inguinal hernia develops when fat or the intestines push through a weak spot in a muscle where your leg meets your lower abdomen (groin). This creates a bulge. This kind of hernia could also be:  In your scrotum, if you are male.  In folds of skin around your vagina, if you are male. There are three types of inguinal hernias:  Hernias that can be pushed back into the abdomen (are reducible). This type rarely causes pain.  Hernias that are not reducible (are incarcerated).  Hernias that are not reducible and lose their blood supply (are strangulated). This type of hernia requires emergency surgery. What are the causes? This condition is caused by having a weak spot in the muscles or tissues in the groin. This weak spot develops over time. The hernia may poke through the weak spot when you suddenly strain your lower abdominal muscles, such as when you:  Lift a heavy object.  Strain to have a bowel movement. Constipation can lead to straining.  Cough. What increases the risk? This condition is more likely to develop in:  Men.  Pregnant women.  People who: ? Are overweight. ? Work in jobs that require long periods of standing or heavy lifting. ? Have had an inguinal hernia before. ? Smoke or have lung disease. These factors can lead to long-lasting (chronic) coughing. What are the signs or symptoms? Symptoms may depend on the size of the hernia. Often, a small inguinal hernia has no symptoms. Symptoms of a larger hernia may include:  A lump in the groin area. This is easier to see when standing. It might not be visible when lying down.  Pain or burning in the groin. This may get worse when lifting, straining, or coughing.  A dull ache or a feeling of pressure in the groin.  In men, an unusual lump in the scrotum. Symptoms of a strangulated inguinal hernia may include:  A bulge in your groin that is very  painful and tender to the touch.  A bulge that turns red or purple.  Fever, nausea, and vomiting.  Inability to have a bowel movement or to pass gas. How is this diagnosed? This condition is diagnosed based on your symptoms, your medical history, and a physical exam. Your health care provider may feel your groin area and ask you to cough. How is this treated? Treatment depends on the size of your hernia and whether you have symptoms. If you do not have symptoms, your health care provider may have you watch your hernia carefully and have you come in for follow-up visits. If your hernia is large or if you have symptoms, you may need surgery to repair the hernia. Follow these instructions at home: Lifestyle  Avoid lifting heavy objects.  Avoid standing for long periods of time.  Do not use any products that contain nicotine or tobacco, such as cigarettes and e-cigarettes. If you need help quitting, ask your health care provider.  Maintain a healthy weight. Preventing constipation  Take actions to prevent constipation. Constipation leads to straining with bowel movements, which can make a hernia worse or cause a hernia repair to break down. Your health care provider may recommend that you: ? Drink enough fluid to keep your urine pale yellow. ? Eat foods that are high in fiber, such as fresh fruits and vegetables, whole grains, and beans. ? Limit foods that are high in fat and processed sugars, such as fried  or sweet foods. ? Take an over-the-counter or prescription medicine for constipation. General instructions  You may try to push the hernia back in place by very gently pressing on it while lying down. Do not try to force the bulge back in if it will not push in easily.  Watch your hernia for any changes in shape, size, or color. Get help right away if you notice any changes.  Take over-the-counter and prescription medicines only as told by your health care provider.  Keep all  follow-up visits as told by your health care provider. This is important. Contact a health care provider if:  You have a fever.  You develop new symptoms.  Your symptoms get worse. Get help right away if:  You have pain in your groin that suddenly gets worse.  You have a bulge in your groin that: ? Suddenly gets bigger and does not get smaller. ? Becomes red or purple or painful to the touch.  You are a man and you have a sudden pain in your scrotum, or the size of your scrotum suddenly changes.  You cannot push the hernia back in place by very gently pressing on it when you are lying down. Do not try to force the bulge back in if it will not push in easily.  You have nausea or vomiting that does not go away.  You have a fast heartbeat.  You cannot have a bowel movement or pass gas. These symptoms may represent a serious problem that is an emergency. Do not wait to see if the symptoms will go away. Get medical help right away. Call your local emergency services (911 in the [place] Summary  An inguinal hernia develops when fat or the intestines push through a weak spot in a muscle where your leg meets your lower abdomen (groin).  This condition is caused by having a weak spot in muscles or tissue in your groin.  Symptoms may depend on the size of the hernia, and they may include pain or swelling in your groin. A small inguinal hernia often has no symptoms.  Treatment may not be needed if you do not have symptoms. If you have symptoms or a large hernia, you may need surgery to repair the hernia.  Avoid lifting heavy objects. Also avoid standing for long amounts of time. This information is not intended to replace advice given to you by your health care provider. Make sure you discuss any questions you have with your health care provider. Document Revised: 10/01/2017 Document Reviewed: 06/01/2017 Elsevier Patient Education  [employer].

## 2020-06-19 NOTE — Progress Notes (Signed)
Daniel Schaefer; [id number]; 1951/06/07   HPI Patient is a 69 year old white male who was referred to my care by Dr. Jeffie Pollock of urology for evaluation and treatment of a left inguinal hernia.  Patient states he noted the left inguinal hernia several months ago.  It seems to be increasing in size and going into his scrotum.  It is starting to cause him discomfort.  He denies any nausea or vomiting.  It is a starting to affect his daily activity.  His past medical history is significant for being on Coumadin for atrial fibrillation. Past Medical History:  Diagnosis Date  . Atrial fibrillation [facility])    Reportedly diagnosed November 2011  . Essential hypertension   . Prostate cancer [facility])    Radiation implants  . Type 2 diabetes mellitus ([place])     Past Surgical History:  Procedure Laterality Date  . Gold seed placement    . LITHOTRIPSY    . PROSTATE BIOPSY    . URETEROSCOPY WITH HOLMIUM LASER LITHOTRIPSY      Family History  Problem Relation Age of Onset  . Cancer Father 38       Laryngeal  . Lymphoma Mother 2    Current Outpatient Medications on File Prior to Visit  Medication Sig Dispense Refill  . amLODipine (NORVASC) 5 MG tablet Take 1 tablet by mouth once daily (Patient taking differently: Take 5 mg by mouth daily. ) 90 tablet 1  . Cholecalciferol (VITAMIN D3) 125 MCG (5000 UT) TABS Take 5,000 Units by mouth daily.     . furosemide (LASIX) 20 MG tablet Take 1 tablet by mouth once daily (Patient taking differently: Take 20 mg by mouth daily. ) 90 tablet 1  . GLOBAL EASE INJECT PEN NEEDLES 31G X 8 MM MISC USE 1 DAILY AS DIRECTED - RUN ON CASH $15 - PER PC. 100 each 5  . glucose blood test strip Use as instructed 100 each 12  . Insulin Glargine (BASAGLAR KWIKPEN) 100 UNIT/ML INJECT 64 UNITS INTO THE SKIN AT BEDTIME (Patient taking differently: Inject 64 Units into the skin at bedtime. ) 10 pen 2  . Insulin Pen Needle (B-D ULTRAFINE III SHORT PEN) 31G X 8 MM MISC 1 each by Does not  apply route as directed. 50 each 2  . Lancets MISC 1 each by Does not apply route as directed. 100 each 3  . lisinopril (ZESTRIL) 20 MG tablet Take 1 tablet by mouth once daily (Patient taking differently: Take 20 mg by mouth daily. ) 90 tablet 1  . metFORMIN (GLUCOPHAGE) 500 MG tablet Daily after supper (Patient taking differently: Take 500 mg by mouth daily with supper. ) 90 tablet 1  . metoprolol tartrate (LOPRESSOR) 25 MG tablet Take 1 tablet (25 mg total) by mouth 2 (two) times daily. 180 tablet 1  . omeprazole (PRILOSEC) 20 MG capsule Take 20 mg by mouth daily.     . potassium chloride (KLOR-CON) 10 MEQ tablet Take 1 tablet by mouth once daily (Patient taking differently: Take 10 mEq by mouth daily. ) 90 tablet 1  . pravastatin (PRAVACHOL) 40 MG tablet Take 1 tablet (40 mg total) by mouth at bedtime. 90 tablet 1  . tadalafil (CIALIS) 20 MG tablet Take 20 mg by mouth daily as needed for erectile dysfunction.    . tamsulosin (FLOMAX) 0.4 MG CAPS capsule Take 1 capsule (0.4 mg total) by mouth daily. 30 capsule 2  . warfarin (COUMADIN) 5 MG tablet Take 1 tablet daily except 1/2  tablet on Wednesdays or as directed (Patient taking differently: Take 5 mg by mouth See admin instructions. Take 5 mg every day EXCEPT: Take 2.5 mg on Wednesday at 1600) 35 tablet 6   No current facility-administered medications on file prior to visit.    Allergies  Allergen Reactions  . Bactrim [Sulfamethoxazole-Trimethoprim] Other (See Comments)    Thin his blood really bad  . Other     Cannot mix warfarin and bactrim   . [name] Flextouch [Insulin Degludec] Rash    Social History   Substance and Sexual Activity  Alcohol Use No  . Alcohol/week: 0.0 standard drinks    Social History   Tobacco Use  Smoking Status Never Smoker  Smokeless Tobacco Never Used    Review of Systems  Constitutional: Negative.   HENT: Positive for sinus pain.   Eyes: Negative.   Respiratory: Negative.   Cardiovascular:  Negative.   Gastrointestinal: Negative.   Genitourinary: Positive for frequency.  Musculoskeletal: Negative.   Skin: Negative.   Neurological: Negative.   Endo/Heme/Allergies: Bruises/bleeds easily.  Psychiatric/Behavioral: Negative.     Objective   Vitals:   06/19/20 1105  BP: 117/74  Pulse: 61  Resp: 16  Temp: 98.4 F (36.9 C)  SpO2: 97%    Physical Exam Vitals reviewed.  Constitutional:      Appearance: Normal appearance. He is obese. He is not ill-appearing.  HENT:     Head: Normocephalic and atraumatic.  Cardiovascular:     Rate and Rhythm: Normal rate. Rhythm irregular.     Heart sounds: Normal heart sounds. No murmur heard.  No friction rub. No gallop.   Pulmonary:     Effort: Pulmonary effort is normal. No respiratory distress.     Breath sounds: Normal breath sounds. No stridor. No wheezing, rhonchi or rales.  Abdominal:     General: Bowel sounds are normal. There is no distension.     Palpations: Abdomen is soft. There is no mass.     Tenderness: There is no abdominal tenderness. There is no guarding or rebound.     Hernia: A hernia is present.     Comments: Large reducible left inguinal hernia.  Genitourinary:    Comments: Genitourinary examination is within normal limits. Skin:    General: Skin is warm and dry.  Neurological:     Mental Status: He is alert and oriented to person, place, and time.     Assessment  Left inguinal hernia History of atrial fibrillation, on Coumadin Plan   Patient is scheduled for left inguinal herniorrhaphy with mesh on 07/02/2020.  The risks and benefits of the procedure including bleeding, mesh use, hernia recurrence, and infection were fully explained to the patient, who gave informed consent.  He will stop his Coumadin 4 days prior to the procedure.

## 2020-06-20 NOTE — H&P (Signed)
Daniel Schaefer; [id number]; [birth year]   HPI Patient is a 69 year old white male who was referred to my care by Dr. Jeffie Pollock of urology for evaluation and treatment of a left inguinal hernia.  Patient states he noted the left inguinal hernia several months ago.  It seems to be increasing in size and going into his scrotum.  It is starting to cause him discomfort.  He denies any nausea or vomiting.  It is a starting to affect his daily activity.  His past medical history is significant for being on Coumadin for atrial fibrillation. Past Medical History:  Diagnosis Date  . Atrial fibrillation [facility])    Reportedly diagnosed November 2011  . Essential hypertension   . Prostate cancer [facility])    Radiation implants  . Type 2 diabetes mellitus ([place])     Past Surgical History:  Procedure Laterality Date  . Gold seed placement    . LITHOTRIPSY    . PROSTATE BIOPSY    . URETEROSCOPY WITH HOLMIUM LASER LITHOTRIPSY      Family History  Problem Relation Age of Onset  . Cancer Father 17       Laryngeal  . Lymphoma Mother 43    Current Outpatient Medications on File Prior to Visit  Medication Sig Dispense Refill  . amLODipine (NORVASC) 5 MG tablet Take 1 tablet by mouth once daily (Patient taking differently: Take 5 mg by mouth daily. ) 90 tablet 1  . Cholecalciferol (VITAMIN D3) 125 MCG (5000 UT) TABS Take 5,000 Units by mouth daily.     . furosemide (LASIX) 20 MG tablet Take 1 tablet by mouth once daily (Patient taking differently: Take 20 mg by mouth daily. ) 90 tablet 1  . GLOBAL EASE INJECT PEN NEEDLES 31G X 8 MM MISC USE 1 DAILY AS DIRECTED - RUN ON CASH $15 - PER PC. 100 each 5  . glucose blood test strip Use as instructed 100 each 12  . Insulin Glargine (BASAGLAR KWIKPEN) 100 UNIT/ML INJECT 64 UNITS INTO THE SKIN AT BEDTIME (Patient taking differently: Inject 64 Units into the skin at bedtime. ) 10 pen 2  . Insulin Pen Needle (B-D ULTRAFINE III SHORT PEN) 31G X 8 MM MISC 1 each by Does not  apply route as directed. 50 each 2  . Lancets MISC 1 each by Does not apply route as directed. 100 each 3  . lisinopril (ZESTRIL) 20 MG tablet Take 1 tablet by mouth once daily (Patient taking differently: Take 20 mg by mouth daily. ) 90 tablet 1  . metFORMIN (GLUCOPHAGE) 500 MG tablet Daily after supper (Patient taking differently: Take 500 mg by mouth daily with supper. ) 90 tablet 1  . metoprolol tartrate (LOPRESSOR) 25 MG tablet Take 1 tablet (25 mg total) by mouth 2 (two) times daily. 180 tablet 1  . omeprazole (PRILOSEC) 20 MG capsule Take 20 mg by mouth daily.     . potassium chloride (KLOR-CON) 10 MEQ tablet Take 1 tablet by mouth once daily (Patient taking differently: Take 10 mEq by mouth daily. ) 90 tablet 1  . pravastatin (PRAVACHOL) 40 MG tablet Take 1 tablet (40 mg total) by mouth at bedtime. 90 tablet 1  . tadalafil (CIALIS) 20 MG tablet Take 20 mg by mouth daily as needed for erectile dysfunction.    . tamsulosin (FLOMAX) 0.4 MG CAPS capsule Take 1 capsule (0.4 mg total) by mouth daily. 30 capsule 2  . warfarin (COUMADIN) 5 MG tablet Take 1 tablet daily except 1/2  tablet on Wednesdays or as directed (Patient taking differently: Take 5 mg by mouth See admin instructions. Take 5 mg every day EXCEPT: Take 2.5 mg on Wednesday at 1600) 35 tablet 6   No current facility-administered medications on file prior to visit.    Allergies  Allergen Reactions  . Bactrim [Sulfamethoxazole-Trimethoprim] Other (See Comments)    Thin his blood really bad  . Other     Cannot mix warfarin and bactrim   . [name] Flextouch [Insulin Degludec] Rash    Social History   Substance and Sexual Activity  Alcohol Use No  . Alcohol/week: 0.0 standard drinks    Social History   Tobacco Use  Smoking Status Never Smoker  Smokeless Tobacco Never Used    Review of Systems  Constitutional: Negative.   HENT: Positive for sinus pain.   Eyes: Negative.   Respiratory: Negative.   Cardiovascular:  Negative.   Gastrointestinal: Negative.   Genitourinary: Positive for frequency.  Musculoskeletal: Negative.   Skin: Negative.   Neurological: Negative.   Endo/Heme/Allergies: Bruises/bleeds easily.  Psychiatric/Behavioral: Negative.     Objective   Vitals:   06/19/20 1105  BP: 117/74  Pulse: 61  Resp: 16  Temp: 98.4 F (36.9 C)  SpO2: 97%    Physical Exam Vitals reviewed.  Constitutional:      Appearance: Normal appearance. He is obese. He is not ill-appearing.  HENT:     Head: Normocephalic and atraumatic.  Cardiovascular:     Rate and Rhythm: Normal rate. Rhythm irregular.     Heart sounds: Normal heart sounds. No murmur heard.  No friction rub. No gallop.   Pulmonary:     Effort: Pulmonary effort is normal. No respiratory distress.     Breath sounds: Normal breath sounds. No stridor. No wheezing, rhonchi or rales.  Abdominal:     General: Bowel sounds are normal. There is no distension.     Palpations: Abdomen is soft. There is no mass.     Tenderness: There is no abdominal tenderness. There is no guarding or rebound.     Hernia: A hernia is present.     Comments: Large reducible left inguinal hernia.  Genitourinary:    Comments: Genitourinary examination is within normal limits. Skin:    General: Skin is warm and dry.  Neurological:     Mental Status: He is alert and oriented to person, place, and time.     Assessment  Left inguinal hernia History of atrial fibrillation, on Coumadin Plan   Patient is scheduled for left inguinal herniorrhaphy with mesh on 07/02/2020.  The risks and benefits of the procedure including bleeding, mesh use, hernia recurrence, and infection were fully explained to the patient, who gave informed consent.  He will stop his Coumadin 4 days prior to the procedure.

## 2020-06-30 NOTE — Patient Instructions (Signed)
Daniel Schaefer  06/30/2020     @PREFPERIOPPHARMACY @   Your procedure is scheduled on  07/02/2020.  Report to [facility] at  0700  A.M.  Call this number if you have problems the morning of surgery:  [phone]   Remember:  Do not eat or drink after midnight.                        Take these medicines the morning of surgery with A SIP OF WATER  Amlodipine, metoprolol, prilosec, flomax. Take 32 units of your glargin the night before your procedure. DO NOT take any medications for diabetes the morning of your procedure.    Do not wear jewelry, make-up or nail polish.  Do not wear lotions, powders, or perfume. Please wear deodorant and brush your teeth.  Do not shave 48 hours prior to surgery.  Men may shave face and neck.  Do not bring valuables to the hospital.  [facility] is not responsible for any belongings or valuables.  Contacts, dentures or bridgework may not be worn into surgery.  Leave your suitcase in the car.  After surgery it may be brought to your room.  For patients admitted to the hospital, discharge time will be determined by your treatment team.  Patients discharged the day of surgery will not be allowed to drive home.   Name and phone number of your driver:   family Special instructions:  DO NOT smoke the morning of your procedure  Please read over the following fact sheets that you were given. Anesthesia Post-op Instructions and Care and Recovery After Surgery       Open Hernia Repair, Adult, Care After These instructions give you information about caring for yourself after your procedure. Your doctor may also give you more specific instructions. If you have problems or questions, contact your doctor. Follow these instructions at home: Surgical cut (incision) care   Follow instructions from your doctor about how to take care of your surgical cut area. Make sure you: ? Wash your hands with soap and water before you change your bandage  (dressing). If you cannot use soap and water, use hand sanitizer. ? Change your bandage as told by your doctor. ? Leave stitches (sutures), skin glue, or skin tape (adhesive) strips in place. They may need to stay in place for 2 weeks or longer. If tape strips get loose and curl up, you may trim the loose edges. Do not remove tape strips completely unless your doctor says it is okay.  Check your surgical cut every day for signs of infection. Check for: ? More redness, swelling, or pain. ? More fluid or blood. ? Warmth. ? Pus or a bad smell. Activity  Do not drive or use heavy machinery while taking prescription pain medicine. Do not drive until your doctor says it is okay.  Until your doctor says it is okay: ? Do not lift anything that is heavier than 10 lb (4.5 kg). ? Do not play contact sports.  Return to your normal activities as told by your doctor. Ask your doctor what activities are safe. General instructions  To prevent or treat having a hard time pooping (constipation) while you are taking prescription pain medicine, your doctor may recommend that you: ? Drink enough fluid to keep your pee (urine) clear or pale yellow. ? Take over-the-counter or prescription medicines. ? Eat foods that are high in fiber, such  as fresh fruits and vegetables, whole grains, and beans. ? Limit foods that are high in fat and processed sugars, such as fried and sweet foods.  Take over-the-counter and prescription medicines only as told by your doctor.  Do not take baths, swim, or use a hot tub until your doctor says it is okay.  Keep all follow-up visits as told by your doctor. This is important. Contact a doctor if:  You develop a rash.  You have more redness, swelling, or pain around your surgical cut.  You have more fluid or blood coming from your surgical cut.  Your surgical cut feels warm to the touch.  You have pus or a bad smell coming from your surgical cut.  You have a fever or  chills.  You have blood in your poop (stool).  You have not pooped in 2-3 days.  Medicine does not help your pain. Get help right away if:  You have chest pain or you are short of breath.  You feel light-headed.  You feel weak and dizzy (feel faint).  You have very bad pain.  You throw up (vomit) and your pain is worse. This information is not intended to replace advice given to you by your health care provider. Make sure you discuss any questions you have with your health care provider. Document Revised: 12/22/2018 Document Reviewed: 02/11/2016 Elsevier Patient Education  2020 [place] Anesthesia, Adult, Care After This sheet gives you information about how to care for yourself after your procedure. Your health care provider may also give you more specific instructions. If you have problems or questions, contact your health care provider. What can I expect after the procedure? After the procedure, the following side effects are common:  Pain or discomfort at the IV site.  Nausea.  Vomiting.  Sore throat.  Trouble concentrating.  Feeling cold or chills.  Weak or tired.  Sleepiness and fatigue.  Soreness and body aches. These side effects can affect parts of the body that were not involved in surgery. Follow these instructions at home:  For at least 24 hours after the procedure:  Have a responsible adult stay with you. It is important to have someone help care for you until you are awake and alert.  Rest as needed.  Do not: ? Participate in activities in which you could fall or become injured. ? Drive. ? Use heavy machinery. ? Drink alcohol. ? Take sleeping pills or medicines that cause drowsiness. ? Make important decisions or sign legal documents. ? Take care of children on your own. Eating and drinking  Follow any instructions from your health care provider about eating or drinking restrictions.  When you feel hungry, start by eating  small amounts of foods that are soft and easy to digest (bland), such as toast. Gradually return to your regular diet.  Drink enough fluid to keep your urine pale yellow.  If you vomit, rehydrate by drinking water, juice, or clear broth. General instructions  If you have sleep apnea, surgery and certain medicines can increase your risk for breathing problems. Follow instructions from your health care provider about wearing your sleep device: ? Anytime you are sleeping, including during daytime naps. ? While taking prescription pain medicines, sleeping medicines, or medicines that make you drowsy.  Return to your normal activities as told by your health care provider. Ask your health care provider what activities are safe for you.  Take over-the-counter and prescription medicines only as told by your health  care provider.  If you smoke, do not smoke without supervision.  Keep all follow-up visits as told by your health care provider. This is important. Contact a health care provider if:  You have nausea or vomiting that does not get better with medicine.  You cannot eat or drink without vomiting.  You have pain that does not get better with medicine.  You are unable to pass urine.  You develop a skin rash.  You have a fever.  You have redness around your IV site that gets worse. Get help right away if:  You have difficulty breathing.  You have chest pain.  You have blood in your urine or stool, or you vomit blood. Summary  After the procedure, it is common to have a sore throat or nausea. It is also common to feel tired.  Have a responsible adult stay with you for the first 24 hours after general anesthesia. It is important to have someone help care for you until you are awake and alert.  When you feel hungry, start by eating small amounts of foods that are soft and easy to digest (bland), such as toast. Gradually return to your regular diet.  Drink enough fluid to keep  your urine pale yellow.  Return to your normal activities as told by your health care provider. Ask your health care provider what activities are safe for you. This information is not intended to replace advice given to you by your health care provider. Make sure you discuss any questions you have with your health care provider. Document Revised: 09/02/2017 Document Reviewed: 04/15/2017 Elsevier Patient Education  [place]. How to Use Chlorhexidine for Bathing Chlorhexidine gluconate (CHG) is a germ-killing (antiseptic) solution that is used to clean the skin. It can get rid of the bacteria that normally live on the skin and can keep them away for about 24 hours. To clean your skin with CHG, you may be given:  A CHG solution to use in the shower or as part of a sponge bath.  A prepackaged cloth that contains CHG. Cleaning your skin with CHG may help lower the risk for infection:  While you are staying in the intensive care unit of the hospital.  If you have a vascular access, such as a central line, to provide short-term or long-term access to your veins.  If you have a catheter to drain urine from your bladder.  If you are on a ventilator. A ventilator is a machine that helps you breathe by moving air in and out of your lungs.  After surgery. What are the risks? Risks of using CHG include:  A skin reaction.  Hearing loss, if CHG gets in your ears.  Eye injury, if CHG gets in your eyes and is not rinsed out.  The CHG product catching fire. Make sure that you avoid smoking and flames after applying CHG to your skin. Do not use CHG:  If you have a chlorhexidine allergy or have previously reacted to chlorhexidine.  On babies younger than 38 months of age. How to use CHG solution  Use CHG only as told by your health care provider, and follow the instructions on the label.  Use the full amount of CHG as directed. Usually, this is one bottle. During a shower Follow  these steps when using CHG solution during a shower (unless your health care provider gives you different instructions): 1. Start the shower. 2. Use your normal soap and shampoo to wash your face  and hair. 3. Turn off the shower or move out of the shower stream. 4. Pour the CHG onto a clean washcloth. Do not use any type of brush or rough-edged sponge. 5. Starting at your neck, lather your body down to your toes. Make sure you follow these instructions: ? If you will be having surgery, pay special attention to the part of your body where you will be having surgery. Scrub this area for at least 1 minute. ? Do not use CHG on your head or face. If the solution gets into your ears or eyes, rinse them well with water. ? Avoid your genital area. ? Avoid any areas of skin that have broken skin, cuts, or scrapes. ? Scrub your back and under your arms. Make sure to wash skin folds. 6. Let the lather sit on your skin for 1-2 minutes or as long as told by your health care provider. 7. Thoroughly rinse your entire body in the shower. Make sure that all body creases and crevices are rinsed well. 8. Dry off with a clean towel. Do not put any substances on your body afterward--such as powder, lotion, or perfume--unless you are told to do so by your health care provider. Only use lotions that are recommended by the manufacturer. 9. Put on clean clothes or pajamas. 10. If it is the night before your surgery, sleep in clean sheets.  During a sponge bath Follow these steps when using CHG solution during a sponge bath (unless your health care provider gives you different instructions): 1. Use your normal soap and shampoo to wash your face and hair. 2. Pour the CHG onto a clean washcloth. 3. Starting at your neck, lather your body down to your toes. Make sure you follow these instructions: ? If you will be having surgery, pay special attention to the part of your body where you will be having surgery. Scrub this  area for at least 1 minute. ? Do not use CHG on your head or face. If the solution gets into your ears or eyes, rinse them well with water. ? Avoid your genital area. ? Avoid any areas of skin that have broken skin, cuts, or scrapes. ? Scrub your back and under your arms. Make sure to wash skin folds. 4. Let the lather sit on your skin for 1-2 minutes or as long as told by your health care provider. 5. Using a different clean, wet washcloth, thoroughly rinse your entire body. Make sure that all body creases and crevices are rinsed well. 6. Dry off with a clean towel. Do not put any substances on your body afterward--such as powder, lotion, or perfume--unless you are told to do so by your health care provider. Only use lotions that are recommended by the manufacturer. 7. Put on clean clothes or pajamas. 8. If it is the night before your surgery, sleep in clean sheets. How to use CHG prepackaged cloths  Only use CHG cloths as told by your health care provider, and follow the instructions on the label.  Use the CHG cloth on clean, dry skin.  Do not use the CHG cloth on your head or face unless your health care provider tells you to.  When washing with the CHG cloth: ? Avoid your genital area. ? Avoid any areas of skin that have broken skin, cuts, or scrapes. Before surgery Follow these steps when using a CHG cloth to clean before surgery (unless your health care provider gives you different instructions): 1. Using the CHG  cloth, vigorously scrub the part of your body where you will be having surgery. Scrub using a back-and-forth motion for 3 minutes. The area on your body should be completely wet with CHG when you are done scrubbing. 2. Do not rinse. Discard the cloth and let the area air-dry. Do not put any substances on the area afterward, such as powder, lotion, or perfume. 3. Put on clean clothes or pajamas. 4. If it is the night before your surgery, sleep in clean sheets.  For general  bathing Follow these steps when using CHG cloths for general bathing (unless your health care provider gives you different instructions). 1. Use a separate CHG cloth for each area of your body. Make sure you wash between any folds of skin and between your fingers and toes. Wash your body in the following order, switching to a new cloth after each step: ? The front of your neck, shoulders, and chest. ? Both of your arms, under your arms, and your hands. ? Your stomach and groin area, avoiding the genitals. ? Your right leg and foot. ? Your left leg and foot. ? The back of your neck, your back, and your buttocks. 2. Do not rinse. Discard the cloth and let the area air-dry. Do not put any substances on your body afterward--such as powder, lotion, or perfume--unless you are told to do so by your health care provider. Only use lotions that are recommended by the manufacturer. 3. Put on clean clothes or pajamas. Contact a health care provider if:  Your skin gets irritated after scrubbing.  You have questions about using your solution or cloth. Get help right away if:  Your eyes become very red or swollen.  Your eyes itch badly.  Your skin itches badly and is red or swollen.  Your hearing changes.  You have trouble seeing.  You have swelling or tingling in your mouth or throat.  You have trouble breathing.  You swallow any chlorhexidine. Summary  Chlorhexidine gluconate (CHG) is a germ-killing (antiseptic) solution that is used to clean the skin. Cleaning your skin with CHG may help to lower your risk for infection.  You may be given CHG to use for bathing. It may be in a bottle or in a prepackaged cloth to use on your skin. Carefully follow your health care provider's instructions and the instructions on the product label.  Do not use CHG if you have a chlorhexidine allergy.  Contact your health care provider if your skin gets irritated after scrubbing. This information is not  intended to replace advice given to you by your health care provider. Make sure you discuss any questions you have with your health care provider. Document Revised: 11/16/2018 Document Reviewed: 07/28/2017 Elsevier Patient Education  [place].

## 2020-07-01 ENCOUNTER — Encounter (HOSPITAL_COMMUNITY)
Admission: RE | Admit: 2020-07-01 | Discharge: 2020-07-01 | Disposition: A | Discharge status: Home / Self Care | Payer: Medicare Other | Source: Ambulatory Visit | Attending: General Surgery | Admitting: General Surgery

## 2020-07-01 ENCOUNTER — Other Ambulatory Visit (HOSPITAL_COMMUNITY)
Admission: RE | Admit: 2020-07-01 | Discharge: 2020-07-01 | Disposition: A | Discharge status: Home / Self Care | Payer: Medicare Other | Source: Ambulatory Visit | Attending: General Surgery | Admitting: General Surgery

## 2020-07-01 ENCOUNTER — Other Ambulatory Visit: Payer: Self-pay

## 2020-07-01 ENCOUNTER — Encounter (HOSPITAL_COMMUNITY): Payer: Self-pay

## 2020-07-01 DIAGNOSIS — Z20822 Contact with and (suspected) exposure to covid-19: Secondary | ICD-10-CM | POA: Diagnosis not present

## 2020-07-01 DIAGNOSIS — Z01812 Encounter for preprocedural laboratory examination: Secondary | ICD-10-CM | POA: Insufficient documentation

## 2020-07-01 HISTORY — DX: Personal history of urinary calculi: Z87.442

## 2020-07-01 LAB — BASIC METABOLIC PANEL
Anion gap: 11 (ref 5–15)
BUN: 20 mg/dL (ref 8–23)
CO2: 26 mmol/L (ref 22–32)
Calcium: 8.9 mg/dL (ref 8.9–10.3)
Chloride: 102 mmol/L (ref 98–111)
Creatinine, Ser: 1.11 mg/dL (ref 0.61–1.24)
GFR, Estimated: >60 mL/min (ref >60)
Glucose, Bld: 102 mg/dL — ABNORMAL HIGH (ref 70–99)
Potassium: 3.7 mmol/L (ref 3.5–5.1)
Sodium: 139 mmol/L (ref 135–145)

## 2020-07-01 LAB — SARS CORONAVIRUS 2 (TAT 6-24 HRS): SARS Coronavirus 2: NEGATIVE

## 2020-07-02 ENCOUNTER — Ambulatory Visit (HOSPITAL_COMMUNITY): Payer: Medicare Other | Admitting: Anesthesiology

## 2020-07-02 ENCOUNTER — Ambulatory Visit (HOSPITAL_COMMUNITY)
Admission: RE | Admit: 2020-07-02 | Discharge: 2020-07-02 | Disposition: A | Discharge status: Home / Self Care | Payer: Medicare Other | Attending: General Surgery | Admitting: General Surgery

## 2020-07-02 ENCOUNTER — Encounter (HOSPITAL_COMMUNITY): Payer: Self-pay | Admitting: General Surgery

## 2020-07-02 ENCOUNTER — Encounter (HOSPITAL_COMMUNITY)
Admission: RE | Disposition: A | Discharge status: Home / Self Care | Payer: Self-pay | Source: Home / Self Care | Attending: General Surgery

## 2020-07-02 DIAGNOSIS — Z79899 Other long term (current) drug therapy: Secondary | ICD-10-CM | POA: Diagnosis not present

## 2020-07-02 DIAGNOSIS — E119 Type 2 diabetes mellitus without complications: Secondary | ICD-10-CM | POA: Diagnosis not present

## 2020-07-02 DIAGNOSIS — Z923 Personal history of irradiation: Secondary | ICD-10-CM | POA: Insufficient documentation

## 2020-07-02 DIAGNOSIS — Z794 Long term (current) use of insulin: Secondary | ICD-10-CM | POA: Diagnosis not present

## 2020-07-02 DIAGNOSIS — Z881 Allergy status to other antibiotic agents status: Secondary | ICD-10-CM | POA: Diagnosis not present

## 2020-07-02 DIAGNOSIS — K4090 Unilateral inguinal hernia, without obstruction or gangrene, not specified as recurrent: Secondary | ICD-10-CM | POA: Insufficient documentation

## 2020-07-02 DIAGNOSIS — I4891 Unspecified atrial fibrillation: Secondary | ICD-10-CM | POA: Diagnosis not present

## 2020-07-02 DIAGNOSIS — I10 Essential (primary) hypertension: Secondary | ICD-10-CM | POA: Insufficient documentation

## 2020-07-02 DIAGNOSIS — Z8546 Personal history of malignant neoplasm of prostate: Secondary | ICD-10-CM | POA: Diagnosis not present

## 2020-07-02 DIAGNOSIS — Z888 Allergy status to other drugs, medicaments and biological substances status: Secondary | ICD-10-CM | POA: Diagnosis not present

## 2020-07-02 DIAGNOSIS — Z7901 Long term (current) use of anticoagulants: Secondary | ICD-10-CM | POA: Diagnosis not present

## 2020-07-02 HISTORY — PX: INGUINAL HERNIA REPAIR: SHX194

## 2020-07-02 LAB — HEMOGLOBIN A1C
Hgb A1c MFr Bld: 7.3 % — ABNORMAL HIGH (ref 4.8–5.6)
Mean Plasma Glucose: 163 mg/dL

## 2020-07-02 LAB — GLUCOSE, CAPILLARY
Glucose-Capillary: 173 mg/dL — ABNORMAL HIGH (ref 70–99)
Glucose-Capillary: 181 mg/dL — ABNORMAL HIGH (ref 70–99)

## 2020-07-02 SURGERY — REPAIR, HERNIA, INGUINAL, ADULT
Anesthesia: General | Laterality: Left

## 2020-07-02 MED ORDER — CEFAZOLIN SODIUM-DEXTROSE 2-4 GM/100ML-% IV SOLN
2.0000 g | INTRAVENOUS | Status: AC
  Administered 2020-07-02: 2 g via INTRAVENOUS
  Filled 2020-07-02: qty 100

## 2020-07-02 MED ORDER — BUPIVACAINE LIPOSOME 1.3 % IJ SUSP
INTRAMUSCULAR | Status: AC
  Filled 2020-07-02: qty 20

## 2020-07-02 MED ORDER — ONDANSETRON HCL 4 MG/2ML IJ SOLN
4.0000 mg | Freq: Once | INTRAMUSCULAR | Status: AC | PRN
  Administered 2020-07-02: 4 mg via INTRAVENOUS
  Filled 2020-07-02: qty 2

## 2020-07-02 MED ORDER — LACTATED RINGERS IV SOLN: INTRAVENOUS | Status: DC

## 2020-07-02 MED ORDER — PROPOFOL 10 MG/ML IV BOLUS
INTRAVENOUS | Status: DC | PRN
  Administered 2020-07-02: 170 mg via INTRAVENOUS

## 2020-07-02 MED ORDER — CHLORHEXIDINE GLUCONATE CLOTH 2 % EX PADS: 6.0000 | MEDICATED_PAD | Freq: Once | CUTANEOUS | Status: DC

## 2020-07-02 MED ORDER — HYDROCODONE-ACETAMINOPHEN 5-325 MG PO TABS: 1.0000 | ORAL_TABLET | Freq: Four times a day (QID) | ORAL | 0 refills | Status: DC | PRN

## 2020-07-02 MED ORDER — ONDANSETRON HCL 4 MG/2ML IJ SOLN
INTRAMUSCULAR | Status: DC | PRN
  Administered 2020-07-02: 4 mg via INTRAVENOUS

## 2020-07-02 MED ORDER — LIDOCAINE 2% (20 MG/ML) 5 ML SYRINGE
INTRAMUSCULAR | Status: AC
  Filled 2020-07-02: qty 5

## 2020-07-02 MED ORDER — FENTANYL CITRATE (PF) 100 MCG/2ML IJ SOLN
25.0000 ug | INTRAMUSCULAR | Status: DC | PRN
  Administered 2020-07-02: 50 ug via INTRAVENOUS
  Administered 2020-07-02: 25 ug via INTRAVENOUS
  Filled 2020-07-02: qty 2

## 2020-07-02 MED ORDER — KETOROLAC TROMETHAMINE 30 MG/ML IJ SOLN
15.0000 mg | Freq: Once | INTRAMUSCULAR | Status: AC
  Administered 2020-07-02: 15 mg via INTRAVENOUS
  Filled 2020-07-02: qty 1

## 2020-07-02 MED ORDER — POVIDONE-IODINE 10 % OINT PACKET
TOPICAL_OINTMENT | CUTANEOUS | Status: DC | PRN
  Administered 2020-07-02: 1 via TOPICAL

## 2020-07-02 MED ORDER — LIDOCAINE HCL (CARDIAC) PF 50 MG/5ML IV SOSY
PREFILLED_SYRINGE | INTRAVENOUS | Status: DC | PRN
  Administered 2020-07-02: 60 mg via INTRAVENOUS

## 2020-07-02 MED ORDER — FENTANYL CITRATE (PF) 250 MCG/5ML IJ SOLN
INTRAMUSCULAR | Status: AC
  Filled 2020-07-02: qty 5

## 2020-07-02 MED ORDER — ONDANSETRON HCL 4 MG/2ML IJ SOLN
INTRAMUSCULAR | Status: AC
  Filled 2020-07-02: qty 2

## 2020-07-02 MED ORDER — SODIUM CHLORIDE 0.9 % IR SOLN
Status: DC | PRN
  Administered 2020-07-02: 1000 mL

## 2020-07-02 MED ORDER — CHLORHEXIDINE GLUCONATE 0.12 % MT SOLN
15.0000 mL | Freq: Once | OROMUCOSAL | Status: AC
  Administered 2020-07-02: 15 mL via OROMUCOSAL

## 2020-07-02 MED ORDER — BUPIVACAINE LIPOSOME 1.3 % IJ SUSP
INTRAMUSCULAR | Status: DC | PRN
  Administered 2020-07-02: 20 mL

## 2020-07-02 MED ORDER — PROPOFOL 10 MG/ML IV BOLUS
INTRAVENOUS | Status: AC
  Filled 2020-07-02: qty 40

## 2020-07-02 MED ORDER — EPHEDRINE 5 MG/ML INJ
INTRAVENOUS | Status: AC
  Filled 2020-07-02: qty 10

## 2020-07-02 MED ORDER — ORAL CARE MOUTH RINSE: 15.0000 mL | Freq: Once | OROMUCOSAL | Status: AC

## 2020-07-02 MED ORDER — EPHEDRINE SULFATE 50 MG/ML IJ SOLN
INTRAMUSCULAR | Status: DC | PRN
  Administered 2020-07-02 (×2): 10 mg via INTRAVENOUS

## 2020-07-02 MED ORDER — FENTANYL CITRATE (PF) 100 MCG/2ML IJ SOLN
INTRAMUSCULAR | Status: DC | PRN
  Administered 2020-07-02: 25 ug via INTRAVENOUS
  Administered 2020-07-02 (×2): 50 ug via INTRAVENOUS
  Administered 2020-07-02 (×3): 25 ug via INTRAVENOUS

## 2020-07-02 MED ORDER — POVIDONE-IODINE 10 % EX OINT
TOPICAL_OINTMENT | CUTANEOUS | Status: AC
  Filled 2020-07-02: qty 1

## 2020-07-02 SURGICAL SUPPLY — 45 items
CLOTH BEACON ORANGE TIMEOUT ST (SAFETY) ×3 IMPLANT
COVER LIGHT HANDLE STERIS (MISCELLANEOUS) ×6 IMPLANT
COVER WAND RF STERILE (DRAPES) ×3 IMPLANT
DRAIN PENROSE 0.5X18 (DRAIN) ×3 IMPLANT
ELECT REM PT RETURN 9FT ADLT (ELECTROSURGICAL) ×3
ELECTRODE REM PT RTRN 9FT ADLT (ELECTROSURGICAL) ×1 IMPLANT
GAUZE SPONGE 4X4 12PLY STRL (GAUZE/BANDAGES/DRESSINGS) ×3 IMPLANT
GLOVE BIO SURGEON STRL SZ7 (GLOVE) ×3 IMPLANT
GLOVE BIOGEL PI IND STRL 7.0 (GLOVE) ×3 IMPLANT
GLOVE BIOGEL PI IND STRL 7.5 (GLOVE) ×2 IMPLANT
GLOVE BIOGEL PI INDICATOR 7.0 (GLOVE) ×6
GLOVE BIOGEL PI INDICATOR 7.5 (GLOVE) ×4
GLOVE ECLIPSE 6.5 STRL STRAW (GLOVE) ×3 IMPLANT
GLOVE SURG SS PI 7.5 STRL IVOR (GLOVE) ×3 IMPLANT
GOWN STRL REUS W/ TWL XL LVL3 (GOWN DISPOSABLE) ×1 IMPLANT
GOWN STRL REUS W/TWL LRG LVL3 (GOWN DISPOSABLE) ×9 IMPLANT
GOWN STRL REUS W/TWL XL LVL3 (GOWN DISPOSABLE) ×3
INST SET MINOR GENERAL (KITS) ×3 IMPLANT
KIT TURNOVER KIT A (KITS) ×3 IMPLANT
MANIFOLD NEPTUNE II (INSTRUMENTS) ×3 IMPLANT
MESH HERNIA 1.6X1.9 PLUG LRG (Mesh General) ×1 IMPLANT
MESH HERNIA PLUG LRG (Mesh General) ×2 IMPLANT
NEEDLE HYPO 18GX1.5 BLUNT FILL (NEEDLE) ×3 IMPLANT
NEEDLE HYPO 21X1.5 SAFETY (NEEDLE) ×3 IMPLANT
NS IRRIG 1000ML POUR BTL (IV SOLUTION) ×3 IMPLANT
PACK MINOR (CUSTOM PROCEDURE TRAY) ×3 IMPLANT
PAD ARMBOARD 7.5X6 YLW CONV (MISCELLANEOUS) ×3 IMPLANT
PENCIL SMOKE EVACUATOR (MISCELLANEOUS) ×3 IMPLANT
SET BASIN LINEN APH (SET/KITS/TRAYS/PACK) ×6 IMPLANT
SOL PREP PROV IODINE SCRUB 4OZ (MISCELLANEOUS) ×3 IMPLANT
SPONGE LAP 18X18 RF (DISPOSABLE) ×3 IMPLANT
STAPLER VISISTAT (STAPLE) ×3 IMPLANT
SUT MNCRL AB 4-0 PS2 18 (SUTURE) ×6 IMPLANT
SUT NOVA NAB GS-22 2 2-0 T-19 (SUTURE) ×9 IMPLANT
SUT PROLENE 2 0 SH 30 (SUTURE) ×3 IMPLANT
SUT SILK 3 0 (SUTURE)
SUT SILK 3-0 18XBRD TIE 12 (SUTURE) IMPLANT
SUT VIC AB 2-0 CT1 27 (SUTURE) ×6
SUT VIC AB 2-0 CT1 TAPERPNT 27 (SUTURE) ×2 IMPLANT
SUT VIC AB 3-0 SH 27 (SUTURE) ×6
SUT VIC AB 3-0 SH 27X BRD (SUTURE) ×2 IMPLANT
SUT VICRYL AB 2 0 TIES (SUTURE) IMPLANT
SUT VICRYL AB 3 0 TIES (SUTURE) ×3 IMPLANT
SYR 20ML LL LF (SYRINGE) ×6 IMPLANT
TAPE CLOTH SURG 4X10 WHT LF (GAUZE/BANDAGES/DRESSINGS) ×3 IMPLANT

## 2020-07-02 NOTE — Op Note (Signed)
Patient:  Daniel Schaefer  DOB:  Oct 26, 1950  MRN:  [id number]   Preop Diagnosis: Left inguinal hernia  Postop Diagnosis: Same  Procedure: Left inguinal herniorrhaphy with mesh  Surgeon: Aviva Signs, MD  Anes: General  Indications: Patient is a 69 year old white male who presents with a symptomatic left inguinal hernia.  The risks and benefits of the procedure including bleeding, infection, mesh use, and the possibly recurrence of the hernia were fully explained to the patient, who gave informed consent.  Procedure note: The patient was placed in the supine position.  After general anesthesia was administered, the left groin region was prepped and draped using the usual sterile technique with Betadine.  Surgical site confirmation was performed.  Incision was made in the left groin region down to the external oblique aponeurosis.  The aponeurosis was incised to the external ring.  A Penrose drain was placed around the spermatic cord.  The vase deferens was noted within the spermatic cord.  The patient had a very large indirect hernia with colon noted the hernia sac.  In order to reduce the colon, I had to resect some epiploic appendages to reduce it.  I was able to somewhat excised the excess sac and close it at the peritoneal layer using a 2-0 Prolene running suture.  This was inverted and a large Bard PerFix plug was then inserted.  An onlay patch was placed along the floor of the inguinal canal and secured superiorly to the conjoined tendon and inferiorly to the shelving edge of Poupart's ligament using 2-0 Novafil interrupted sutures.  The internal ring was recreated using a 2-0 Novafil interrupted suture.  The external oblique aponeurosis was reapproximated using a 2-0 Vicryl running suture.  Subcutaneous layer was reapproximated using 3-0 Vicryl interrupted sutures.  Exparel was instilled into the surrounding wound.  The skin was closed using staples.  Betadine ointment and a dry sterile  dressing were applied.  All tape and needle counts were correct at the end of the procedure.  The patient was awakened and transferred to PACU in stable condition.  Complications:   EBL: Minimal  Specimen: None

## 2020-07-02 NOTE — Discharge Instructions (Signed)
Open Hernia Repair, Adult, Care After This sheet gives you information about how to care for yourself after your procedure. Your health care provider may also give you more specific instructions. If you have problems or questions, contact your health care provider. What can I expect after the procedure? After the procedure, it is common to have:  Mild discomfort.  Slight bruising.  Minor swelling.  Pain in the abdomen. Follow these instructions at home: Incision care   Follow instructions from your health care provider about how to take care of your incision area. Make sure you: ? Wash your hands with soap and water before you change your bandage (dressing). If soap and water are not available, use hand sanitizer. ? Change your dressing as told by your health care provider. ? Leave stitches (sutures), skin glue, or adhesive strips in place. These skin closures may need to stay in place for 2 weeks or longer. If adhesive strip edges start to loosen and curl up, you may trim the loose edges. Do not remove adhesive strips completely unless your health care provider tells you to do that.  Check your incision area every day for signs of infection. Check for: ? More redness, swelling, or pain. ? More fluid or blood. ? Warmth. ? Pus or a bad smell. Activity  Do not drive or use heavy machinery while taking prescription pain medicine. Do not drive until your health care provider approves.  Until your health care provider approves: ? Do not lift anything that is heavier than 10 lb (4.5 kg). ? Do not play contact sports.  Return to your normal activities as told by your health care provider. Ask your health care provider what activities are safe. General instructions  To prevent or treat constipation while you are taking prescription pain medicine, your health care provider may recommend that you: ? Drink enough fluid to keep your urine clear or pale yellow. ? Take over-the-counter or  prescription medicines. ? Eat foods that are high in fiber, such as fresh fruits and vegetables, whole grains, and beans. ? Limit foods that are high in fat and processed sugars, such as fried and sweet foods.  Take over-the-counter and prescription medicines only as told by your health care provider.  Do not take tub baths or go swimming until your health care provider approves.  Keep all follow-up visits as told by your health care provider. This is important. Contact a health care provider if:  You develop a rash.  You have more redness, swelling, or pain around your incision.  You have more fluid or blood coming from your incision.  Your incision feels warm to the touch.  You have pus or a bad smell coming from your incision.  You have a fever or chills.  You have blood in your stool (feces).  You have not had a bowel movement in 2-3 days.  Your pain is not controlled with medicine. Get help right away if:  You have chest pain or shortness of breath.  You feel light-headed or feel faint.  You have severe pain.  You vomit and your pain is worse. This information is not intended to replace advice given to you by your health care provider. Make sure you discuss any questions you have with your health care provider. Document Revised: 08/12/2017 Document Reviewed: 02/11/2016 Elsevier Patient Education  2020 [employer].

## 2020-07-02 NOTE — Anesthesia Postprocedure Evaluation (Signed)
Anesthesia Post Note  Patient: Daniel Schaefer  Procedure(s) Performed: LEFT INGUINAL HERNIORRHAPY WITH MESH (Left )  Patient location during evaluation: Phase II Anesthesia Type: General Level of consciousness: awake Pain management: pain level controlled Vital Signs Assessment: post-procedure vital signs reviewed and stable Respiratory status: spontaneous breathing Cardiovascular status: blood pressure returned to baseline Anesthetic complications: no   No complications documented.   Last Vitals:  Vitals:   07/02/20 1000 07/02/20 1008  BP: (!) 150/70 (!) 142/82  Pulse: 66 65  Resp: 10 16  Temp:  36.9 C  SpO2: 97% 96%    Last Pain:  Vitals:   07/02/20 1008  TempSrc:   PainSc: 0-No pain                 [name]

## 2020-07-02 NOTE — Interval H&P Note (Signed)
History and Physical Interval Note:  07/02/2020 7:26 AM  Daniel Schaefer  has presented today for surgery, with the diagnosis of Left inguinal hernia.  The various methods of treatment have been discussed with the patient and family. After consideration of risks, benefits and other options for treatment, the patient has consented to  Procedure(s): LEFT INGUINAL HERNIORRHAPY WITH MESH (Left) as a surgical intervention.  The patient's history has been reviewed, patient examined, no change in status, stable for surgery.  I have reviewed the patient's chart and labs.  Questions were answered to the patient's satisfaction.     Aviva Signs

## 2020-07-02 NOTE — Anesthesia Procedure Notes (Signed)
Procedure Name: LMA Insertion Date/Time: 07/02/2020 7:36 AM Performed by: Ollen Bowl, CRNA Pre-anesthesia Checklist: Patient identified, Patient being monitored, Emergency Drugs available, Timeout performed and Suction available Patient Re-evaluated:Patient Re-evaluated prior to induction Oxygen Delivery Method: Circle System Utilized Preoxygenation: Pre-oxygenation with 100% oxygen Induction Type: IV induction Ventilation: Mask ventilation without difficulty LMA: LMA inserted LMA Size: 4.0 Number of attempts: 1 Placement Confirmation: positive ETCO2 and breath sounds checked- equal and bilateral

## 2020-07-02 NOTE — Transfer of Care (Signed)
Immediate Anesthesia Transfer of Care Note  Patient: Daniel Schaefer  Procedure(s) Performed: LEFT INGUINAL HERNIORRHAPY WITH MESH (Left )  Patient Location: PACU  Anesthesia Type:General  Level of Consciousness: awake  Airway & Oxygen Therapy: Patient Spontanous Breathing  Post-op Assessment: Report given to RN  Post vital signs: Reviewed and stable  Last Vitals:  Vitals Value Taken Time  BP 153/92 07/02/20 0924  Temp    Pulse 51 07/02/20 0926  Resp 5 07/02/20 0926  SpO2 96 % 07/02/20 0926  Vitals shown include unvalidated device data.  Last Pain:  Vitals:   07/02/20 0655  TempSrc: Oral  PainSc: 0-No pain      Patients Stated Pain Goal: 5 (57/84/69 6295)  Complications: No complications documented.

## 2020-07-02 NOTE — Anesthesia Preprocedure Evaluation (Signed)
Anesthesia Evaluation  Patient identified by MRN, date of birth, ID band Patient awake    Reviewed: Allergy & Precautions, H&P , NPO status , Patient's Chart, lab work & pertinent test results, reviewed documented beta blocker date and time   Airway Mallampati: II  TM Distance: >3 FB Neck ROM: full    Dental no notable dental hx.    Pulmonary neg pulmonary ROS,    Pulmonary exam normal breath sounds clear to auscultation       Cardiovascular Exercise Tolerance: Good hypertension, + dysrhythmias Atrial Fibrillation  Rhythm:irregular Rate:Normal     Neuro/Psych negative neurological ROS  negative psych ROS   GI/Hepatic negative GI ROS, Neg liver ROS,   Endo/Other  negative endocrine ROSdiabetes, Type 2  Renal/GU negative Renal ROS  negative genitourinary   Musculoskeletal   Abdominal   Peds  Hematology negative hematology ROS (+)   Anesthesia Other Findings   Reproductive/Obstetrics negative OB ROS                             Anesthesia Physical Anesthesia Plan  ASA: II  Anesthesia Plan: General   Post-op Pain Management:    Induction:   PONV Risk Score and Plan: Ondansetron  Airway Management Planned:   Additional Equipment:   Intra-op Plan:   Post-operative Plan:   Informed Consent: I have reviewed the patients History and Physical, chart, labs and discussed the procedure including the risks, benefits and alternatives for the proposed anesthesia with the patient or authorized representative who has indicated his/her understanding and acceptance.     Dental Advisory Given  Plan Discussed with: CRNA  Anesthesia Plan Comments:         Anesthesia Quick Evaluation

## 2020-07-03 ENCOUNTER — Encounter (HOSPITAL_COMMUNITY): Payer: Self-pay | Admitting: General Surgery

## 2020-07-15 ENCOUNTER — Encounter: Payer: Self-pay | Admitting: General Surgery

## 2020-07-15 ENCOUNTER — Other Ambulatory Visit: Payer: Self-pay

## 2020-07-15 ENCOUNTER — Ambulatory Visit (INDEPENDENT_AMBULATORY_CARE_PROVIDER_SITE_OTHER): Payer: Medicare Other | Admitting: General Surgery

## 2020-07-15 VITALS — BP 170/84 | HR 74 | Temp 98.1°F | Resp 16 | Ht 70.5 in | Wt 227.0 lb

## 2020-07-15 DIAGNOSIS — Z09 Encounter for follow-up examination after completed treatment for conditions other than malignant neoplasm: Secondary | ICD-10-CM

## 2020-07-15 NOTE — Progress Notes (Signed)
Subjective:     Daniel Schaefer  Here for follow-up status post left inguinal herniorrhaphy with mesh.  Patient is doing well.  He has mild incisional pain with movement, but it is minimal. Objective:    BP (!) 170/84   Pulse 74   Temp 98.1 F (36.7 C) (Oral)   Resp 16   Ht 5' 10.5" (1.791 m)   Wt 227 lb (103 kg)   SpO2 96%   BMI 32.11 kg/m   General:  alert, cooperative and no distress  Abdomen soft, incision healing well.  Staples removed, Steri-Strips applied.     Assessment:    Doing well postoperatively.    Plan:   May increase activity as able over the next few weeks.  She avoid any significant heavy lifting this week.  He should keep his back straight and use his thighs.  Follow-up as needed.

## 2020-07-16 ENCOUNTER — Ambulatory Visit (INDEPENDENT_AMBULATORY_CARE_PROVIDER_SITE_OTHER): Payer: Medicare Other | Admitting: *Deleted

## 2020-07-16 DIAGNOSIS — Z5181 Encounter for therapeutic drug level monitoring: Secondary | ICD-10-CM

## 2020-07-16 DIAGNOSIS — I4821 Permanent atrial fibrillation: Secondary | ICD-10-CM

## 2020-07-16 DIAGNOSIS — I4891 Unspecified atrial fibrillation: Secondary | ICD-10-CM

## 2020-07-16 LAB — POCT INR: INR: 2.1 (ref 2.0–3.0)

## 2020-07-16 NOTE — Patient Instructions (Signed)
Continue warfarin 1 tablet daily except 1/2 tablet on Wednesdays.   Continue greens Recheck in 4 weeks

## 2020-07-17 ENCOUNTER — Ambulatory Visit (INDEPENDENT_AMBULATORY_CARE_PROVIDER_SITE_OTHER): Payer: Medicare Other | Admitting: Cardiology

## 2020-07-17 ENCOUNTER — Encounter: Payer: Self-pay | Admitting: Cardiology

## 2020-07-17 VITALS — BP 150/88 | HR 56 | Ht 70.0 in | Wt 227.0 lb

## 2020-07-17 DIAGNOSIS — I10 Essential (primary) hypertension: Secondary | ICD-10-CM | POA: Diagnosis not present

## 2020-07-17 DIAGNOSIS — I4821 Permanent atrial fibrillation: Secondary | ICD-10-CM | POA: Diagnosis not present

## 2020-07-17 NOTE — Patient Instructions (Addendum)

## 2020-07-17 NOTE — Progress Notes (Signed)
Cardiology Office Note  Date: 07/17/2020   ID: Daniel Schaefer, DOB 1950/12/09, MRN [id number]  PCP:  Leeanne Rio, MD  Cardiologist:  Rozann Lesches, MD Electrophysiologist:  None   Chief Complaint  Patient presents with  . Cardiac follow-up    History of Present Illness: Daniel Schaefer is a 69 y.o. male last seen in April by Mr. Leonides Sake NP.  He presents for a routine visit.  Reports no palpitations or chest pain.  I reviewed the chart, he recently underwent left inguinal herniorrhaphy with mesh per Dr. Arnoldo Morale on October 20.  He is just now starting to get back to more activity.  He remains on Coumadin with follow-up in the anticoagulation clinic.  Recent INR 2.1.  He does not report any spontaneous bleeding problems.  He was off Coumadin temporarily for the above procedure.  I reviewed his medications which are outlined below.  Past Medical History:  Diagnosis Date  . Atrial fibrillation [facility])    Reportedly diagnosed November 2011  . Essential hypertension   . History of kidney stones   . Prostate cancer [facility])    Radiation implants  . Type 2 diabetes mellitus ([place])     Past Surgical History:  Procedure Laterality Date  . Gold seed placement    . INGUINAL HERNIA REPAIR Left 07/02/2020   Procedure: LEFT INGUINAL HERNIORRHAPY WITH MESH;  Surgeon: Aviva Signs, MD;  Location: [facility];  Service: General;  Laterality: Left;  . LITHOTRIPSY    . PROSTATE BIOPSY    . TRIGGER FINGER RELEASE Left    ring finger  . URETEROSCOPY WITH HOLMIUM LASER LITHOTRIPSY      Current Outpatient Medications  Medication Sig Dispense Refill  . amLODipine (NORVASC) 5 MG tablet Take 1 tablet by mouth once daily (Patient taking differently: Take 5 mg by mouth daily. ) 90 tablet 1  . ascorbic acid (VITAMIN C) 500 MG tablet Take 500 mg by mouth daily.    . Cholecalciferol (VITAMIN D3) 125 MCG (5000 UT) TABS Take 5,000 Units by mouth daily.     Marland Kitchen doxylamine, Sleep, (UNISOM) 25 MG  tablet Take 25 mg by mouth at bedtime as needed for sleep.    . furosemide (LASIX) 20 MG tablet Take 1 tablet by mouth once daily (Patient taking differently: Take 20 mg by mouth daily. ) 90 tablet 1  . GLOBAL EASE INJECT PEN NEEDLES 31G X 8 MM MISC USE 1 DAILY AS DIRECTED - RUN ON CASH $15 - PER PC. 100 each 5  . glucose blood test strip Use as instructed 100 each 12  . HYDROcodone-acetaminophen (NORCO) 5-325 MG tablet Take 1 tablet by mouth every 6 (six) hours as needed for moderate pain. 25 tablet 0  . Insulin Glargine (BASAGLAR KWIKPEN) 100 UNIT/ML INJECT 64 UNITS INTO THE SKIN AT BEDTIME (Patient taking differently: Inject 64 Units into the skin at bedtime. ) 10 pen 2  . Insulin Pen Needle (B-D ULTRAFINE III SHORT PEN) 31G X 8 MM MISC 1 each by Does not apply route as directed. 50 each 2  . Lancets MISC 1 each by Does not apply route as directed. 100 each 3  . lisinopril (ZESTRIL) 20 MG tablet Take 1 tablet by mouth once daily (Patient taking differently: Take 20 mg by mouth daily. ) 90 tablet 1  . loratadine (CLARITIN) 10 MG tablet Take 10 mg by mouth daily.    . metFORMIN (GLUCOPHAGE) 500 MG tablet Daily after supper (Patient taking differently:  Take 500 mg by mouth daily with supper. ) 90 tablet 1  . metoprolol tartrate (LOPRESSOR) 25 MG tablet Take 1 tablet (25 mg total) by mouth 2 (two) times daily. 180 tablet 1  . omeprazole (PRILOSEC) 20 MG capsule Take 20 mg by mouth daily.     Marland Kitchen oxymetazoline (AFRIN) 0.05 % nasal spray Place 1 spray into both nostrils 2 (two) times daily as needed for congestion.    . potassium chloride (KLOR-CON) 10 MEQ tablet Take 1 tablet by mouth once daily (Patient taking differently: Take 10 mEq by mouth daily. ) 90 tablet 1  . pravastatin (PRAVACHOL) 40 MG tablet Take 1 tablet (40 mg total) by mouth at bedtime. 90 tablet 1  . tadalafil (CIALIS) 20 MG tablet Take 20 mg by mouth daily as needed for erectile dysfunction.    . tamsulosin (FLOMAX) 0.4 MG CAPS capsule  Take 1 capsule (0.4 mg total) by mouth daily. 30 capsule 2  . warfarin (COUMADIN) 5 MG tablet Take 1 tablet daily except 1/2 tablet on Wednesdays or as directed (Patient taking differently: Take 5 mg by mouth See admin instructions. Take 5 mg every day EXCEPT: Take 2.5 mg on Wednesday at 1600) 35 tablet 6   No current facility-administered medications for this visit.   Allergies:  Bactrim [sulfamethoxazole-trimethoprim], Other, and Tresiba flextouch [insulin degludec]   ROS: No palpitations or syncope.  Transient leg swelling prior to having his surgery, now resolved.  Physical Exam: VS:  BP (!) 150/88   Pulse (!) 56   Ht 5\' 10"  (1.778 m)   Wt 227 lb (103 kg)   SpO2 98%   BMI 32.57 kg/m , BMI Body mass index is 32.57 kg/m.  Wt Readings from Last 3 Encounters:  07/17/20 227 lb (103 kg)  07/15/20 227 lb (103 kg)  07/01/20 224 lb (101.6 kg)    General: Patient appears comfortable at rest. HEENT: Conjunctiva and lids normal, wearing a mask. Neck: Supple, no elevated JVP or carotid bruits, no thyromegaly. Lungs: Clear to auscultation, nonlabored breathing at rest. Cardiac: Irregularly irregular, no gallop. Extremities: No pitting edema.  ECG:  An ECG dated 01/09/2020 was personally reviewed today and demonstrated:  Rate controlled atrial fibrillation with low voltage.  Recent Labwork: 03/24/2020: ALT 15; AST 14 07/01/2020: BUN 20; Creatinine, Ser 1.11; Potassium 3.7; Sodium 139     Component Value Date/Time   CHOL 142 03/24/2020 0000   TRIG 107 03/24/2020 0000   HDL 40 03/24/2020 0000   LDLCALC 81 03/24/2020 0000    Other Studies Reviewed Today:  Echocardiogram 04/04/2013: Study Conclusions  - Left ventricle: The cavity size was normal. Wall thickness was increased in a pattern of mild LVH. Systolic function was normal. The estimated ejection fraction was in the range of 50% to 55%. Wall motion was normal; there were no regional wall motion abnormalities. The  study is not technically sufficient to allow evaluation of LV diastolic function, due to underlying atrial fibrillation. - Left atrium: The atrium was mildly dilated. - Pulmonic valve: Mild regurgitation.  Assessment and Plan:  1.  Permanent atrial fibrillation.  CHA2DS2-VASc score is 3.  He remains asymptomatic in terms of palpitations.  Continue Coumadin for stroke prophylaxis with follow-up in the anticoagulation clinic.  Heart rate well controlled on metoprolol.  2.  Essential hypertension, he continues on Norvasc and Zestril with follow-up by Dr. Huel Cote.  I reviewed his recent lab work.  Medication Adjustments/Labs and Tests Ordered: Current medicines are reviewed at length with  the patient today.  Concerns regarding medicines are outlined above.   Tests Ordered: No orders of the defined types were placed in this encounter.   Medication Changes: No orders of the defined types were placed in this encounter.   Disposition:  Follow up 6 months in the [place] office.  Signed, Satira Sark, MD, [facility] 07/17/2020 1:16 PM    [facility] [address], [place], [place] [address] Phone: [phone]; Fax: [phone]

## 2020-07-29 ENCOUNTER — Other Ambulatory Visit: Payer: Self-pay

## 2020-07-29 DIAGNOSIS — N200 Calculus of kidney: Secondary | ICD-10-CM

## 2020-07-29 MED ORDER — TAMSULOSIN HCL 0.4 MG PO CAPS: 0.4000 mg | ORAL_CAPSULE | Freq: Every day | ORAL | 2 refills | Status: DC

## 2020-08-20 ENCOUNTER — Ambulatory Visit (INDEPENDENT_AMBULATORY_CARE_PROVIDER_SITE_OTHER): Payer: Medicare Other | Admitting: *Deleted

## 2020-08-20 DIAGNOSIS — I4891 Unspecified atrial fibrillation: Secondary | ICD-10-CM

## 2020-08-20 DIAGNOSIS — I4821 Permanent atrial fibrillation: Secondary | ICD-10-CM

## 2020-08-20 DIAGNOSIS — Z5181 Encounter for therapeutic drug level monitoring: Secondary | ICD-10-CM

## 2020-08-20 LAB — POCT INR: INR: 2.9 (ref 2.0–3.0)

## 2020-08-20 NOTE — Patient Instructions (Signed)
Continue warfarin 1 tablet daily except 1/2 tablet on Wednesdays.   Continue greens Recheck in 6 weeks  

## 2020-09-08 ENCOUNTER — Other Ambulatory Visit: Payer: Self-pay | Admitting: Cardiology

## 2020-09-17 ENCOUNTER — Other Ambulatory Visit: Payer: Medicare Other

## 2020-09-18 ENCOUNTER — Other Ambulatory Visit: Payer: Medicare Other

## 2020-09-18 ENCOUNTER — Other Ambulatory Visit: Payer: Self-pay

## 2020-09-18 DIAGNOSIS — R9721 Rising PSA following treatment for malignant neoplasm of prostate: Secondary | ICD-10-CM

## 2020-09-18 DIAGNOSIS — N500 Atrophy of testis: Secondary | ICD-10-CM

## 2020-09-19 ENCOUNTER — Other Ambulatory Visit: Payer: Self-pay | Admitting: Cardiology

## 2020-09-19 LAB — TESTOSTERONE: Testosterone: 180 ng/dL — ABNORMAL LOW (ref 264–916)

## 2020-09-19 LAB — PSA: Prostate Specific Ag, Serum: 0.2 ng/mL (ref 0.0–4.0)

## 2020-09-23 ENCOUNTER — Other Ambulatory Visit: Payer: Self-pay | Admitting: Cardiology

## 2020-09-25 ENCOUNTER — Encounter: Payer: Self-pay | Admitting: Urology

## 2020-09-25 ENCOUNTER — Other Ambulatory Visit: Payer: Self-pay

## 2020-09-25 ENCOUNTER — Ambulatory Visit (INDEPENDENT_AMBULATORY_CARE_PROVIDER_SITE_OTHER): Payer: Medicare Other | Admitting: Urology

## 2020-09-25 VITALS — BP 154/91 | HR 66 | Temp 97.8°F | Ht 70.5 in | Wt 228.0 lb

## 2020-09-25 DIAGNOSIS — Z794 Long term (current) use of insulin: Secondary | ICD-10-CM

## 2020-09-25 DIAGNOSIS — E782 Mixed hyperlipidemia: Secondary | ICD-10-CM

## 2020-09-25 DIAGNOSIS — E291 Testicular hypofunction: Secondary | ICD-10-CM | POA: Diagnosis not present

## 2020-09-25 DIAGNOSIS — E1159 Type 2 diabetes mellitus with other circulatory complications: Secondary | ICD-10-CM

## 2020-09-25 DIAGNOSIS — Z8546 Personal history of malignant neoplasm of prostate: Secondary | ICD-10-CM | POA: Diagnosis not present

## 2020-09-25 DIAGNOSIS — N200 Calculus of kidney: Secondary | ICD-10-CM

## 2020-09-25 DIAGNOSIS — R9721 Rising PSA following treatment for malignant neoplasm of prostate: Secondary | ICD-10-CM | POA: Diagnosis not present

## 2020-09-25 LAB — COMPREHENSIVE METABOLIC PANEL
Calcium: 8.4 — AB (ref 8.7–10.7)
GFR calc Af Amer: 60
GFR calc non Af Amer: 52

## 2020-09-25 LAB — BASIC METABOLIC PANEL
BUN: 24 — AB (ref 4–21)
Creatinine: 1.4 — AB (ref 0.6–1.3)

## 2020-09-25 NOTE — Progress Notes (Signed)
Urological Symptom Review  Patient is experiencing the following symptoms: Frequent urination Getting up at night to urinate Leakage of urine Erection problems Review of Systems  Gastrointestinal (upper)  : Negative for upper GI symptoms  Gastrointestinal (lower) : Negative for lower GI symptoms  Constitutional : Negative for symptoms  Skin: Negative for skin symptoms  Eyes: Negative for eye symptoms  Ear/Nose/Throat : Sinus problems  Hematologic/Lymphatic: Negative for Hematologic/Lymphatic symptoms  Cardiovascular : Negative for cardiovascular symptoms  Respiratory : Negative for respiratory symptoms  Endocrine: Negative for endocrine symptoms  Musculoskeletal: Negative for musculoskeletal symptoms  Neurological: Negative for neurological symptoms  Psychologic: Negative for psychiatric symptoms

## 2020-09-25 NOTE — Progress Notes (Signed)
Subjective:  1. History of adenocarcinoma of prostate   2. Rising PSA following treatment for malignant neoplasm of prostate   3. Hypogonadism in male   4. Renal stone     Daniel Schaefer returns today in f/u with a repeat PSA.  His PSA had risen to 0.3 at his last visit but is back down to 0.2.   He has a history of prostate cancer treated with EXRT and seeds in 2003.  He has a history of ED and used tadalafil with a partial response.  He had a testosterone level of 180 on 09/18/20.  He is voiding well with an IPSS of 3 and nocturia x 2.  He has had some post void dribbling on occasion since the hernia repair and he has some urgency.  He didn't have a foley with the hernia repair.   He has a LLP stone that has been enlarging and was 69mm at his last visit.    His left inguinal hernia was repaired on 07/02/20.   He is still recovering from that.  He had a lot of swelling and bruising.    ROS:  ROS:  A complete review of systems was performed.  All systems are negative except for pertinent findings as noted.   ROS  Allergies  Allergen Reactions  . Bactrim [Sulfamethoxazole-Trimethoprim] Other (See Comments)    Thin his blood really bad  . Other     Cannot mix warfarin and bactrim   . [name] Flextouch [Insulin Degludec] Rash    Outpatient Encounter Medications as of 09/25/2020  Medication Sig  . amLODipine (NORVASC) 5 MG tablet Take 1 tablet (5 mg total) by mouth daily.  Marland Kitchen ascorbic acid (VITAMIN C) 500 MG tablet Take 500 mg by mouth daily.  . Cholecalciferol (VITAMIN D3) 125 MCG (5000 UT) TABS Take 5,000 Units by mouth daily.   Marland Kitchen doxylamine, Sleep, (UNISOM) 25 MG tablet Take 25 mg by mouth at bedtime as needed for sleep.  . furosemide (LASIX) 20 MG tablet Take 1 tablet by mouth once daily  . GLOBAL EASE INJECT PEN NEEDLES 31G X 8 MM MISC USE 1 DAILY AS DIRECTED - RUN ON CASH $15 - PER PC.  Marland Kitchen glucose blood test strip Use as instructed  . HYDROcodone-acetaminophen (NORCO) 5-325 MG tablet Take  1 tablet by mouth every 6 (six) hours as needed for moderate pain.  . Insulin Glargine (BASAGLAR KWIKPEN) 100 UNIT/ML INJECT 64 UNITS INTO THE SKIN AT BEDTIME (Patient taking differently: Inject 64 Units into the skin at bedtime.)  . Insulin Pen Needle (B-D ULTRAFINE III SHORT PEN) 31G X 8 MM MISC 1 each by Does not apply route as directed.  . Lancets MISC 1 each by Does not apply route as directed.  Marland Kitchen lisinopril (ZESTRIL) 20 MG tablet Take 1 tablet by mouth once daily (Patient taking differently: Take 20 mg by mouth daily.)  . loratadine (CLARITIN) 10 MG tablet Take 10 mg by mouth daily.  . metFORMIN (GLUCOPHAGE) 500 MG tablet Daily after supper (Patient taking differently: Take 500 mg by mouth daily with supper.)  . metoprolol tartrate (LOPRESSOR) 25 MG tablet Take 1 tablet (25 mg total) by mouth 2 (two) times daily.  Marland Kitchen omeprazole (PRILOSEC) 20 MG capsule Take 20 mg by mouth daily.   Marland Kitchen oxymetazoline (AFRIN) 0.05 % nasal spray Place 1 spray into both nostrils 2 (two) times daily as needed for congestion.  . potassium chloride (KLOR-CON) 10 MEQ tablet Take 1 tablet by mouth once daily  .  pravastatin (PRAVACHOL) 40 MG tablet Take 1 tablet (40 mg total) by mouth at bedtime.  . tadalafil (CIALIS) 20 MG tablet Take 20 mg by mouth daily as needed for erectile dysfunction.  . tamsulosin (FLOMAX) 0.4 MG CAPS capsule Take 1 capsule (0.4 mg total) by mouth daily.  Marland Kitchen warfarin (COUMADIN) 5 MG tablet TAKE ONE TABLET DAILY EXCEPT 1/2 TABLET ON WEDNESDAYS OR AS DIRECTED   No facility-administered encounter medications on file as of 09/25/2020.    Past Medical History:  Diagnosis Date  . Atrial fibrillation [facility])    Reportedly diagnosed November 2011  . Essential hypertension   . History of kidney stones   . Prostate cancer [facility])    Radiation implants  . Type 2 diabetes mellitus ([place])     Past Surgical History:  Procedure Laterality Date  . Gold seed placement    . INGUINAL HERNIA REPAIR Left  07/02/2020   Procedure: LEFT INGUINAL HERNIORRHAPY WITH MESH;  Surgeon: Aviva Signs, MD;  Location: [facility];  Service: General;  Laterality: Left;  . LITHOTRIPSY    . PROSTATE BIOPSY    . TRIGGER FINGER RELEASE Left    ring finger  . URETEROSCOPY WITH HOLMIUM LASER LITHOTRIPSY      Social History   Socioeconomic History  . Marital status: Divorced    Spouse name: Not on file  . Number of children: Not on file  . Years of education: Not on file  . Highest education level: Not on file  Occupational History  . Not on file  Tobacco Use  . Smoking status: Never Smoker  . Smokeless tobacco: Never Used  Vaping Use  . Vaping Use: Never used  Substance and Sexual Activity  . Alcohol use: No    Alcohol/week: 0.0 standard drinks  . Drug use: No  . Sexual activity: Yes  Other Topics Concern  . Not on file  Social History Narrative  . Not on file   Social Determinants of Health   Financial Resource Strain: Not on file  Food Insecurity: Not on file  Transportation Needs: Not on file  Physical Activity: Not on file  Stress: Not on file  Social Connections: Not on file  Intimate Partner Violence: Not on file    Family History  Problem Relation Age of Onset  . Cancer Father 21       Laryngeal  . Lymphoma Mother 50       Objective: Vitals:   09/25/20 1501  BP: (!) 154/91  Pulse: 66  Temp: 97.8 F (36.6 C)     Physical Exam  Lab Results:  No results found for this or any previous visit (from the past 24 hour(s)).  BMET No results for input(s): NA, K, CL, CO2, GLUCOSE, BUN, CREATININE, CALCIUM in the last 72 hours. PSA  Lab Results  Component Value Date   PSA1 0.2 09/18/2020   PSA1 0.3 06/09/2020      Studies/Results: No results found.    Assessment & Plan:  History of prostate cancer.   His PSA is back down.  He will need that in a year.   Left inguinal hernia.   He is recovering from surgery.  Testicular atrophy.  His T level was 180.  I will  get a repeat level with an FSH/LH and prolactin and consider testosterone replacement if the level is low.  Renal stone.  He has interval growth and will probably need treatment with ESWL or URS but would like to deal with the hernia  first.  I will repeat a KUB at f/u 3 mo..    No orders of the defined types were placed in this encounter.    Orders Placed This Encounter  Procedures  . DG Abd 1 View    Standing Status:   Future    Standing Expiration Date:   01/23/2021    Scheduling Instructions:     He would like it done at [place] Specific Question:   Reason for Exam (SYMPTOM  OR DIAGNOSIS REQUIRED)    Answer:   nephrolithiasis    Order Specific Question:   Preferred imaging location?    Answer:   External    Order Specific Question:   Radiology Contrast Protocol - do NOT remove file path    Answer:   [email or web address]  . Urinalysis, Routine w reflex microscopic  . Testosterone Free, Profile I    Standing Status:   Future    Standing Expiration Date:   10/26/2020  . Follicle stimulating hormone    Standing Status:   Future    Standing Expiration Date:   10/26/2020  . Luteinizing hormone    Standing Status:   Future    Standing Expiration Date:   10/26/2020  . Prolactin    Standing Status:   Future    Standing Expiration Date:   10/26/2020      Return in about 3 months (around 12/24/2020) for with KUB.   CC: Leeanne Rio, MD      Irine Seal 09/25/2020

## 2020-09-26 ENCOUNTER — Ambulatory Visit: Payer: Medicare Other | Admitting: Urology

## 2020-10-01 ENCOUNTER — Ambulatory Visit (INDEPENDENT_AMBULATORY_CARE_PROVIDER_SITE_OTHER): Payer: Medicare Other | Admitting: *Deleted

## 2020-10-01 DIAGNOSIS — Z5181 Encounter for therapeutic drug level monitoring: Secondary | ICD-10-CM | POA: Diagnosis not present

## 2020-10-01 DIAGNOSIS — I4891 Unspecified atrial fibrillation: Secondary | ICD-10-CM

## 2020-10-01 DIAGNOSIS — I4821 Permanent atrial fibrillation: Secondary | ICD-10-CM

## 2020-10-01 LAB — POCT INR: INR: 2.6 (ref 2.0–3.0)

## 2020-10-01 NOTE — Patient Instructions (Signed)
Continue warfarin 1 tablet daily except 1/2 tablet on Wednesdays.   Continue greens Recheck in 6 weeks  

## 2020-10-02 ENCOUNTER — Ambulatory Visit: Payer: Medicare Other | Admitting: Nurse Practitioner

## 2020-10-02 ENCOUNTER — Encounter: Payer: Self-pay | Admitting: Nurse Practitioner

## 2020-10-02 ENCOUNTER — Other Ambulatory Visit: Payer: Self-pay

## 2020-10-02 VITALS — BP 148/88 | HR 59 | Ht 70.5 in | Wt 231.6 lb

## 2020-10-02 DIAGNOSIS — E1159 Type 2 diabetes mellitus with other circulatory complications: Secondary | ICD-10-CM | POA: Diagnosis not present

## 2020-10-02 DIAGNOSIS — Z794 Long term (current) use of insulin: Secondary | ICD-10-CM

## 2020-10-02 DIAGNOSIS — E782 Mixed hyperlipidemia: Secondary | ICD-10-CM

## 2020-10-02 DIAGNOSIS — I10 Essential (primary) hypertension: Secondary | ICD-10-CM | POA: Diagnosis not present

## 2020-10-02 LAB — POCT GLYCOSYLATED HEMOGLOBIN (HGB A1C): HbA1c, POC (controlled diabetic range): 8 % — AB (ref 0.0–7.0)

## 2020-10-02 NOTE — Progress Notes (Signed)
10/02/2020  Endocrinology follow-up note   Subjective:    Patient ID: Daniel Schaefer, male    DOB: 10-24-1950,    Past Medical History:  Diagnosis Date  . Atrial fibrillation [facility])    Reportedly diagnosed November 2011  . Essential hypertension   . History of kidney stones   . Prostate cancer [facility])    Radiation implants  . Type 2 diabetes mellitus ([place])    Past Surgical History:  Procedure Laterality Date  . Gold seed placement    . INGUINAL HERNIA REPAIR Left 07/02/2020   Procedure: LEFT INGUINAL HERNIORRHAPY WITH MESH;  Surgeon: Aviva Signs, MD;  Location: [facility];  Service: General;  Laterality: Left;  . LITHOTRIPSY    . PROSTATE BIOPSY    . TRIGGER FINGER RELEASE Left    ring finger  . URETEROSCOPY WITH HOLMIUM LASER LITHOTRIPSY     Social History   Socioeconomic History  . Marital status: Divorced    Spouse name: Not on file  . Number of children: Not on file  . Years of education: Not on file  . Highest education level: Not on file  Occupational History  . Not on file  Tobacco Use  . Smoking status: Never Smoker  . Smokeless tobacco: Never Used  Vaping Use  . Vaping Use: Never used  Substance and Sexual Activity  . Alcohol use: No    Alcohol/week: 0.0 standard drinks  . Drug use: No  . Sexual activity: Yes  Other Topics Concern  . Not on file  Social History Narrative  . Not on file   Social Determinants of Health   Financial Resource Strain: Not on file  Food Insecurity: Not on file  Transportation Needs: Not on file  Physical Activity: Not on file  Stress: Not on file  Social Connections: Not on file   Outpatient Encounter Medications as of 10/02/2020  Medication Sig  . amLODipine (NORVASC) 5 MG tablet Take 1 tablet (5 mg total) by mouth daily.  Marland Kitchen ascorbic acid (VITAMIN C) 500 MG tablet Take 500 mg by mouth daily.  . Cholecalciferol (VITAMIN D3) 125 MCG (5000 UT) TABS Take 5,000 Units by mouth daily.   Marland Kitchen doxylamine, Sleep, (UNISOM) 25 MG  tablet Take 25 mg by mouth at bedtime as needed for sleep.  . furosemide (LASIX) 20 MG tablet Take 1 tablet by mouth once daily  . GLOBAL EASE INJECT PEN NEEDLES 31G X 8 MM MISC USE 1 DAILY AS DIRECTED - RUN ON CASH $15 - PER PC.  Marland Kitchen glucose blood test strip Use as instructed  . HYDROcodone-acetaminophen (NORCO) 5-325 MG tablet Take 1 tablet by mouth every 6 (six) hours as needed for moderate pain.  . Insulin Glargine (BASAGLAR KWIKPEN) 100 UNIT/ML INJECT 64 UNITS INTO THE SKIN AT BEDTIME (Patient taking differently: Inject 64 Units into the skin at bedtime.)  . Insulin Pen Needle (B-D ULTRAFINE III SHORT PEN) 31G X 8 MM MISC 1 each by Does not apply route as directed.  . Lancets MISC 1 each by Does not apply route as directed.  Marland Kitchen lisinopril (ZESTRIL) 20 MG tablet Take 1 tablet by mouth once daily (Patient taking differently: Take 20 mg by mouth daily.)  . loratadine (CLARITIN) 10 MG tablet Take 10 mg by mouth daily.  . metFORMIN (GLUCOPHAGE) 500 MG tablet Daily after supper (Patient taking differently: Take 500 mg by mouth daily with supper.)  . metoprolol tartrate (LOPRESSOR) 25 MG tablet Take 1 tablet (25 mg total) by mouth  2 (two) times daily.  Marland Kitchen omeprazole (PRILOSEC) 20 MG capsule Take 20 mg by mouth daily.   Marland Kitchen oxymetazoline (AFRIN) 0.05 % nasal spray Place 1 spray into both nostrils 2 (two) times daily as needed for congestion.  . potassium chloride (KLOR-CON) 10 MEQ tablet Take 1 tablet by mouth once daily  . pravastatin (PRAVACHOL) 40 MG tablet Take 1 tablet (40 mg total) by mouth at bedtime.  . tadalafil (CIALIS) 20 MG tablet Take 20 mg by mouth daily as needed for erectile dysfunction.  . tamsulosin (FLOMAX) 0.4 MG CAPS capsule Take 1 capsule (0.4 mg total) by mouth daily.  Marland Kitchen warfarin (COUMADIN) 5 MG tablet TAKE ONE TABLET DAILY EXCEPT 1/2 TABLET ON WEDNESDAYS OR AS DIRECTED   No facility-administered encounter medications on file as of 10/02/2020.   ALLERGIES: Allergies  Allergen  Reactions  . Bactrim [Sulfamethoxazole-Trimethoprim] Other (See Comments)    Thin his blood really bad  . Other     Cannot mix warfarin and bactrim   . [name] Flextouch [Insulin Degludec] Rash   VACCINATION STATUS:  There is no immunization history on file for this patient.  Diabetes He presents for his follow-up diabetic visit. He has type 2 diabetes mellitus. Onset time: He was diagnosed at approximate age of 13 years. His disease course has been stable. There are no hypoglycemic associated symptoms. There are no diabetic associated symptoms. There are no hypoglycemic complications. Symptoms are stable. Diabetic complications include nephropathy. Risk factors for coronary artery disease include hypertension, dyslipidemia, sedentary lifestyle, male sex and diabetes mellitus. Current diabetic treatment includes oral agent (monotherapy) and insulin injections. He is compliant with treatment most of the time. His weight is increasing steadily. He is following a generally unhealthy diet. When asked about meal planning, he reported none. He has not had a previous visit with a dietitian. He rarely participates in exercise. His home blood glucose trend is fluctuating minimally. His breakfast blood glucose range is generally 90-110 mg/dl. His dinner blood glucose range is generally 180-200 mg/dl. His bedtime blood glucose range is generally 180-200 mg/dl. (He presents today with his meter and logs, showing at target fasting and slightly above target postprandial glycemic profile.  His POCT A1c today is 8%, increasing from last visit of 7.3%.  He says 2021 was a difficult year for him- he survived COVID infection and had 2 surgeries (hernia repair and trigger finger repair).  He knows he has not done as well with his diet and exercise lately and promises to do better.  He denies any significant hypoglycemia.) An ACE inhibitor/angiotensin II receptor blocker is being taken. He does not see a podiatrist.Eye exam  is current.  Hypertension This is a chronic problem. The current episode started more than 1 year ago. The problem is unchanged. The problem is controlled. There are no associated agents to hypertension. Risk factors for coronary artery disease include diabetes mellitus, dyslipidemia, male gender and sedentary lifestyle. Past treatments include ACE inhibitors, calcium channel blockers, diuretics and beta blockers. The current treatment provides moderate improvement. There are no compliance problems.  Hypertensive end-organ damage includes kidney disease. Identifiable causes of hypertension include chronic renal disease.  Hyperlipidemia This is a chronic problem. The current episode started more than 1 year ago. The problem is controlled. Recent lipid tests were reviewed and are normal. Exacerbating diseases include chronic renal disease and diabetes. Factors aggravating his hyperlipidemia include beta blockers. Current antihyperlipidemic treatment includes statins. The current treatment provides moderate improvement of lipids. There are no  compliance problems.  Risk factors for coronary artery disease include dyslipidemia, diabetes mellitus, hypertension, male sex and a sedentary lifestyle.    Review of systems  Constitutional: + Minimally fluctuating body weight,  current Body mass index is 32.76 kg/m. , no fatigue, no subjective hyperthermia, no subjective hypothermia Eyes: no blurry vision, no xerophthalmia ENT: no sore throat, no nodules palpated in throat, no dysphagia/odynophagia, no hoarseness Cardiovascular: no chest pain, no shortness of breath, no palpitations, no leg swelling Respiratory: no cough, no shortness of breath Gastrointestinal: no nausea/vomiting/diarrhea Musculoskeletal: no muscle/joint aches Skin: no rashes, no hyperemia Neurological: no tremors, no numbness, no tingling, no dizziness Psychiatric: no depression, no anxiety   Objective:    BP (!) 148/88 (BP Location:  Left Arm)   Pulse (!) 59   Ht 5' 10.5" (1.791 m)   Wt 231 lb 9.6 oz (105.1 kg)   BMI 32.76 kg/m   Wt Readings from Last 3 Encounters:  10/02/20 231 lb 9.6 oz (105.1 kg)  09/25/20 228 lb (103.4 kg)  07/17/20 227 lb (103 kg)    BP Readings from Last 3 Encounters:  10/02/20 (!) 148/88  09/25/20 (!) 154/91  07/17/20 (!) 150/88    Physical Exam- Limited  Constitutional:  Body mass index is 32.76 kg/m. , not in acute distress, normal state of mind Eyes:  EOMI, no exophthalmos Neck: Supple Thyroid: No gross goiter Respiratory: Adequate breathing efforts Musculoskeletal: no gross deformities, strength intact in all four extremities, no gross restriction of joint movements Skin:  no rashes, no hyperemia Neurological: no tremor with outstretched hands,    POCT ABI Results 10/02/20   Right ABI:  1.16      Left ABI:  1.28  Right leg systolic / diastolic: 546/56 mmHg Left leg systolic / diastolic: 812/75 mmHg  Arm systolic / diastolic: 170/01 mmHG  Detailed report will be scanned into patient chart.    Results for orders placed or performed in visit on 10/01/20  POCT INR  Result Value Ref Range   INR 2.6 2.0 - 3.0   Complete Blood Count (Most recent): Lab Results  Component Value Date   WBC 6.6 08/31/2013   HGB 14.0 08/31/2013   HCT 40.1 08/31/2013   MCV 75.9 (L) 08/31/2013   PLT 235 08/31/2013   Chemistry (most recent): Lab Results  Component Value Date   NA 139 07/01/2020   K 3.7 07/01/2020   CL 102 07/01/2020   CO2 26 07/01/2020   BUN 24 (A) 09/25/2020   CREATININE 1.4 (A) 09/25/2020   Lipid Panel     Component Value Date/Time   CHOL 142 03/24/2020 0000   TRIG 107 03/24/2020 0000   HDL 40 03/24/2020 0000   LDLCALC 81 03/24/2020 0000    Assessment & Plan:   1) Type 2 diabetes mellitus with stage 3 renal insufficiency, with long-term current use of insulin ([place])  He presents today with his meter and logs, showing at target fasting and slightly above  target postprandial glycemic profile.  His POCT A1c today is 8%, increasing from last visit of 7.3%.  He says 2021 was a difficult year for him- he survived COVID infection and had 2 surgeries (hernia repair and trigger finger repair).  He knows he has not done as well with his diet and exercise lately and promises to do better.  He denies any significant hypoglycemia.  -Patient remains at a high risk for more acute and chronic complications of diabetes which include CAD, CVA, CKD, retinopathy, and  neuropathy. These are all discussed in detail with the patient.  -Recent labs reviewed showing improved renal function.  - Nutritional counseling repeated at each appointment due to patients tendency to fall back in to old habits.  - The patient admits there is a room for improvement in their diet and drink choices. -  Suggestion is made for the patient to avoid simple carbohydrates from their diet including Cakes, Sweet Desserts / Pastries, Ice Cream, Soda (diet and regular), Sweet Tea, Candies, Chips, Cookies, Sweet Pastries,  Store Bought Juices, Alcohol in Excess of  1-2 drinks a day, Artificial Sweeteners, Coffee Creamer, and "Sugar-free" Products. This will help patient to have stable blood glucose profile and potentially avoid unintended weight gain.   - I encouraged the patient to switch to  unprocessed or minimally processed complex starch and increased protein intake (animal or plant source), fruits, and vegetables.   - Patient is advised to stick to a routine mealtimes to eat 3 meals  a day and avoid unnecessary snacks ( to snack only to correct hypoglycemia).  -He is following with Jearld Fenton, CDE for diabetes education.  - Given his at target fasting glycemic profile, he will not tolerate increase in his Basaglar.  He is advised to continue Basaglar to 64 units nightly and continue Metformin 500 mg po daily with supper.   -He is encouraged to continue monitoring blood glucose twice  daily, before breakfast and before bed, and to call the clinic if he has readings less than 70 or greater than 200 for 3 tests in a row.  - He is generally hesitant to add medications.  - Patient is not a suitable candidate for incretin therapy nor SGLT2i.  - Patient specific target  for A1c; LDL, HDL, Triglycerides, and  Waist Circumference were discussed in detail.  2) BP/HTN: His blood pressure is controlled to target for his age.  He is advised to continue Norvasc 5 mg po daily, Lasix 20 mg po daily, Lisinopril 20 mg po daily, and Metoprolol 25 mg po twice daily.  3) Lipids/HPL:  His most recent lipid panel from 03/24/20 shows controlled LDL at 81.  He is advised to continue Pravastatin 40 mg po daily at bedtime.  Side effects and precautions discussed with him.  4)  Weight/Diet:  His Body mass index is 32.76 kg/m.-candidate for some weight loss.  He is following with Jearld Fenton, CD for diabetes education.  No success in weight loss,  exercise, and carbohydrates information provided.  5) Chronic Care/Health Maintenance: -Patient on ACEI and Statin medications and encouraged to continue to follow up with Ophthalmology, Podiatrist at least yearly or according to recommendations, and advised to stay away from smoking. I have recommended yearly flu vaccine and pneumonia vaccination at least every 5 years; moderate intensity exercise for up to 150 minutes weekly; and  sleep for at least 7 hours a day.   He is advised to maintain close follow-up with his PMD Dr. Huel Cote.  - Time spent on this patient care encounter:  30 min, of which > 50% was spent in  counseling and the rest reviewing his blood glucose logs , discussing his hypoglycemia and hyperglycemia episodes, reviewing his current and  previous labs / studies  ( including abstraction from other facilities) and medications  doses and developing a  long term treatment plan and documenting his care.   Please refer to Patient Instructions  for Blood Glucose Monitoring and Insulin/Medications Dosing Guide"  in media tab for additional  information. Please  also refer to " Patient Self Inventory" in the Media  tab for reviewed elements of pertinent patient history.  Daniel Schaefer participated in the discussions, expressed understanding, and voiced agreement with the above plans.  All questions were answered to his satisfaction. he is encouraged to contact clinic should he have any questions or concerns prior to his return visit.   Follow up plan: Return in about 6 months (around 04/01/2021) for Diabetes follow up- A1c and urine micro in office, No previsit labs, Bring glucometer and logs.  Rayetta Pigg, [facility] [address] [place], [place] [address] Phone: [phone] Fax: [phone]  10/02/2020, 11:35 AM

## 2020-10-02 NOTE — Patient Instructions (Signed)

## 2020-10-07 ENCOUNTER — Telehealth: Payer: Self-pay | Admitting: Urology

## 2020-10-07 NOTE — Telephone Encounter (Signed)
Patient called the office today to report that he had labs at [facility].  Will you please compare the labs from [facility] on 10/02/20 to what was ordered by Dr. Jeffie Pollock?  If there are additional labs that need to be done, please let the patient know.  Thank you.

## 2020-10-08 ENCOUNTER — Other Ambulatory Visit: Payer: Self-pay

## 2020-10-16 ENCOUNTER — Other Ambulatory Visit: Payer: Self-pay | Admitting: Cardiology

## 2020-10-20 ENCOUNTER — Other Ambulatory Visit: Payer: Self-pay | Admitting: Urology

## 2020-10-20 ENCOUNTER — Other Ambulatory Visit: Payer: Self-pay | Admitting: Cardiology

## 2020-10-20 DIAGNOSIS — N200 Calculus of kidney: Secondary | ICD-10-CM

## 2020-11-12 ENCOUNTER — Ambulatory Visit (INDEPENDENT_AMBULATORY_CARE_PROVIDER_SITE_OTHER): Payer: Medicare Other | Admitting: *Deleted

## 2020-11-12 DIAGNOSIS — Z5181 Encounter for therapeutic drug level monitoring: Secondary | ICD-10-CM

## 2020-11-12 DIAGNOSIS — I4891 Unspecified atrial fibrillation: Secondary | ICD-10-CM | POA: Diagnosis not present

## 2020-11-12 DIAGNOSIS — I4821 Permanent atrial fibrillation: Secondary | ICD-10-CM | POA: Diagnosis not present

## 2020-11-12 LAB — POCT INR: INR: 3 (ref 2.0–3.0)

## 2020-11-12 NOTE — Patient Instructions (Signed)
Continue warfarin 1 tablet daily except 1/2 tablet on Wednesdays.   Continue greens Recheck in 6 weeks  

## 2020-11-21 ENCOUNTER — Other Ambulatory Visit: Payer: Self-pay | Admitting: Cardiology

## 2020-11-21 ENCOUNTER — Other Ambulatory Visit: Payer: Self-pay | Admitting: Urology

## 2020-11-21 DIAGNOSIS — N200 Calculus of kidney: Secondary | ICD-10-CM

## 2020-12-18 ENCOUNTER — Ambulatory Visit (HOSPITAL_COMMUNITY)
Admission: RE | Admit: 2020-12-18 | Discharge: 2020-12-18 | Disposition: A | Discharge status: Home / Self Care | Payer: Medicare Other | Source: Ambulatory Visit | Attending: Urology | Admitting: Urology

## 2020-12-18 DIAGNOSIS — N200 Calculus of kidney: Secondary | ICD-10-CM | POA: Insufficient documentation

## 2020-12-24 ENCOUNTER — Ambulatory Visit (INDEPENDENT_AMBULATORY_CARE_PROVIDER_SITE_OTHER): Payer: Medicare Other | Admitting: *Deleted

## 2020-12-24 ENCOUNTER — Other Ambulatory Visit: Payer: Self-pay

## 2020-12-24 ENCOUNTER — Other Ambulatory Visit: Payer: Self-pay | Admitting: Urology

## 2020-12-24 DIAGNOSIS — N200 Calculus of kidney: Secondary | ICD-10-CM

## 2020-12-24 DIAGNOSIS — Z5181 Encounter for therapeutic drug level monitoring: Secondary | ICD-10-CM | POA: Diagnosis not present

## 2020-12-24 DIAGNOSIS — I4821 Permanent atrial fibrillation: Secondary | ICD-10-CM

## 2020-12-24 DIAGNOSIS — I4891 Unspecified atrial fibrillation: Secondary | ICD-10-CM

## 2020-12-24 LAB — POCT INR: INR: 2.6 (ref 2.0–3.0)

## 2020-12-24 NOTE — Patient Instructions (Signed)
Continue warfarin 1 tablet daily except 1/2 tablet on Wednesdays.   Continue greens Recheck in 6 weeks  

## 2020-12-25 ENCOUNTER — Ambulatory Visit (INDEPENDENT_AMBULATORY_CARE_PROVIDER_SITE_OTHER): Payer: Medicare Other | Admitting: Urology

## 2020-12-25 ENCOUNTER — Encounter: Payer: Self-pay | Admitting: Urology

## 2020-12-25 VITALS — BP 155/91 | HR 74

## 2020-12-25 DIAGNOSIS — R3912 Poor urinary stream: Secondary | ICD-10-CM

## 2020-12-25 DIAGNOSIS — N200 Calculus of kidney: Secondary | ICD-10-CM

## 2020-12-25 DIAGNOSIS — Z8546 Personal history of malignant neoplasm of prostate: Secondary | ICD-10-CM | POA: Diagnosis not present

## 2020-12-25 DIAGNOSIS — E291 Testicular hypofunction: Secondary | ICD-10-CM

## 2020-12-25 LAB — MICROSCOPIC EXAMINATION
Bacteria, UA: NONE SEEN
Epithelial Cells (non renal): NONE SEEN /hpf (ref 0–10)
Renal Epithel, UA: NONE SEEN /hpf
WBC, UA: NONE SEEN /hpf (ref 0–5)

## 2020-12-25 LAB — URINALYSIS, ROUTINE W REFLEX MICROSCOPIC
Bilirubin, UA: NEGATIVE
Ketones, UA: NEGATIVE
Leukocytes,UA: NEGATIVE
Nitrite, UA: NEGATIVE
Protein,UA: NEGATIVE
Specific Gravity, UA: 1.015 (ref 1.005–1.030)
Urobilinogen, Ur: 0.2 mg/dL (ref 0.2–1.0)
pH, UA: 5.5 (ref 5.0–7.5)

## 2020-12-25 NOTE — Progress Notes (Signed)
Urological Symptom Review  Patient is experiencing the following symptoms: Get up at night to urinate Leakage of urine Weak stream Erection problems (male only)   Review of Systems  Gastrointestinal (upper)  : Negative for upper GI symptoms  Gastrointestinal (lower) : Negative for lower GI symptoms  Constitutional : Fatigue  Skin: Negative for skin symptoms  Eyes: Negative for eye symptoms  Ear/Nose/Throat : Sinus problems  Hematologic/Lymphatic: Negative for Hematologic/Lymphatic symptoms  Cardiovascular : Negative for cardiovascular symptoms  Respiratory : Negative for respiratory symptoms  Endocrine: Negative for endocrine symptoms  Musculoskeletal: Negative for musculoskeletal symptoms  Neurological: Negative for neurological symptoms  Psychologic: Negative for psychiatric symptoms

## 2020-12-25 NOTE — Progress Notes (Signed)
Subjective:  1. Personal history of malignant neoplasm of prostate   2. Renal stones   3. Weak urinary stream   4. Hypogonadism in male     Daniel Schaefer returns today in f/u with a repeat PSA.  His PSA had risen to 0.3 at his last visit but is back down to 0.2.   He has a history of prostate cancer treated with EXRT and seeds in 2003.  He has a history of ED and used tadalafil with a partial response but he isn't on it currently.  He had a testosterone level of 180 on 09/18/20.  He is voiding well with an IPSS of 7.  He remains on tamsulosin.  He has a reduced stream  and nocturia x 2.  He has had some post void dribbling on occasion since the hernia repair and he has some urgency.  He didn't have a foley with the hernia repair.   He has a LLP stone that has been enlarging and was 71mm in 7/20.  It was 10.85mm in 1/22 and is now 10.74mm.    His left inguinal hernia was repaired on 07/02/20.   He has finally recovered from that.   ROS:  ROS:  A complete review of systems was performed.  All systems are negative except for pertinent findings as noted.   ROS  Allergies  Allergen Reactions  . Bactrim [Sulfamethoxazole-Trimethoprim] Other (See Comments)    Thin his blood really bad  . Other     Cannot mix warfarin and bactrim   . [name] Flextouch [Insulin Degludec] Rash    Outpatient Encounter Medications as of 12/25/2020  Medication Sig  . amLODipine (NORVASC) 5 MG tablet Take 1 tablet (5 mg total) by mouth daily.  Marland Kitchen ascorbic acid (VITAMIN C) 500 MG tablet Take 500 mg by mouth daily.  . Cholecalciferol (VITAMIN D3) 125 MCG (5000 UT) TABS Take 5,000 Units by mouth daily.   Marland Kitchen doxylamine, Sleep, (UNISOM) 25 MG tablet Take 25 mg by mouth at bedtime as needed for sleep.  . furosemide (LASIX) 20 MG tablet Take 1 tablet by mouth once daily  . GLOBAL EASE INJECT PEN NEEDLES 31G X 8 MM MISC USE 1 DAILY AS DIRECTED - RUN ON CASH $15 - PER PC.  Marland Kitchen glucose blood test strip Use as instructed  .  HYDROcodone-acetaminophen (NORCO) 5-325 MG tablet Take 1 tablet by mouth every 6 (six) hours as needed for moderate pain.  . Insulin Glargine (BASAGLAR KWIKPEN) 100 UNIT/ML INJECT 64 UNITS INTO THE SKIN AT BEDTIME (Patient taking differently: Inject 64 Units into the skin at bedtime.)  . Insulin Pen Needle (B-D ULTRAFINE III SHORT PEN) 31G X 8 MM MISC 1 each by Does not apply route as directed.  . Lancets MISC 1 each by Does not apply route as directed.  Marland Kitchen lisinopril (ZESTRIL) 20 MG tablet Take 1 tablet (20 mg total) by mouth daily.  Marland Kitchen loratadine (CLARITIN) 10 MG tablet Take 10 mg by mouth daily.  . metFORMIN (GLUCOPHAGE) 500 MG tablet Daily after supper (Patient taking differently: Take 500 mg by mouth daily with supper.)  . metoprolol tartrate (LOPRESSOR) 25 MG tablet Take 1 tablet (25 mg total) by mouth 2 (two) times daily.  Marland Kitchen omeprazole (PRILOSEC) 20 MG capsule Take 20 mg by mouth daily.   Marland Kitchen oxymetazoline (AFRIN) 0.05 % nasal spray Place 1 spray into both nostrils 2 (two) times daily as needed for congestion.  . potassium chloride (KLOR-CON) 10 MEQ tablet Take 1 tablet  by mouth once daily  . pravastatin (PRAVACHOL) 40 MG tablet TAKE 1 TABLET BY MOUTH AT BEDTIME  . tadalafil (CIALIS) 20 MG tablet Take 20 mg by mouth daily as needed for erectile dysfunction.  . tamsulosin (FLOMAX) 0.4 MG CAPS capsule Take 1 capsule by mouth once daily  . warfarin (COUMADIN) 5 MG tablet TAKE 1 TABLET BY MOUTH ONCE DAILY EXCEPT 1/2 TABLET ON WEDNESDAYS  OR  AS  DIRECTED   No facility-administered encounter medications on file as of 12/25/2020.    Past Medical History:  Diagnosis Date  . Atrial fibrillation [facility])    Reportedly diagnosed November 2011  . Essential hypertension   . History of kidney stones   . Prostate cancer [facility])    Radiation implants  . Type 2 diabetes mellitus ([place])     Past Surgical History:  Procedure Laterality Date  . Gold seed placement    . INGUINAL HERNIA REPAIR Left  07/02/2020   Procedure: LEFT INGUINAL HERNIORRHAPY WITH MESH;  Surgeon: Aviva Signs, MD;  Location: [facility];  Service: General;  Laterality: Left;  . LITHOTRIPSY    . PROSTATE BIOPSY    . TRIGGER FINGER RELEASE Left    ring finger  . URETEROSCOPY WITH HOLMIUM LASER LITHOTRIPSY      Social History   Socioeconomic History  . Marital status: Divorced    Spouse name: Not on file  . Number of children: Not on file  . Years of education: Not on file  . Highest education level: Not on file  Occupational History  . Not on file  Tobacco Use  . Smoking status: Never Smoker  . Smokeless tobacco: Never Used  Vaping Use  . Vaping Use: Never used  Substance and Sexual Activity  . Alcohol use: No    Alcohol/week: 0.0 standard drinks  . Drug use: No  . Sexual activity: Yes  Other Topics Concern  . Not on file  Social History Narrative  . Not on file   Social Determinants of Health   Financial Resource Strain: Not on file  Food Insecurity: Not on file  Transportation Needs: Not on file  Physical Activity: Not on file  Stress: Not on file  Social Connections: Not on file  Intimate Partner Violence: Not on file    Family History  Problem Relation Age of Onset  . Cancer Father 70       Laryngeal  . Lymphoma Mother 27       Objective: Vitals:   12/25/20 1537  BP: (!) 155/91  Pulse: 74     Physical Exam  Lab Results:  Results for orders placed or performed in visit on 12/25/20 (from the past 24 hour(s))  Urinalysis, Routine w reflex microscopic     Status: Abnormal   Collection Time: 12/25/20  4:35 PM  Result Value Ref Range   Specific Gravity, UA 1.015 1.005 - 1.030   pH, UA 5.5 5.0 - 7.5   Color, UA Yellow Yellow   Appearance Ur Clear Clear   Leukocytes,UA Negative Negative   Protein,UA Negative Negative/Trace   Glucose, UA 2+ (A) Negative   Ketones, UA Negative Negative   RBC, UA Trace (A) Negative   Bilirubin, UA Negative Negative   Urobilinogen, Ur 0.2  0.2 - 1.0 mg/dL   Nitrite, UA Negative Negative   Microscopic Examination See below:    Narrative   Performed at:  [facility] [address] [place], [place]  [id number] Lab Director: Mina Marble MT,  Phone:  [phone]  Microscopic Examination     Status: None   Collection Time: 12/25/20  4:35 PM   Urine  Result Value Ref Range   WBC, UA None seen 0 - 5 /hpf   RBC 0-2 0 - 2 /hpf   Epithelial Cells (non renal) None seen 0 - 10 /hpf   Renal Epithel, UA None seen None seen /hpf   Bacteria, UA None seen None seen/Few   Narrative   Performed at:  [facility] [address] [place], [place]  [id number] Lab Director: [place], Phone:  [phone]    BMET No results for input(s): NA, K, CL, CO2, GLUCOSE, BUN, CREATININE, CALCIUM in the last 72 hours. PSA  Lab Results  Component Value Date   PSA1 0.2 09/18/2020   PSA1 0.3 06/09/2020      Studies/Results: No results found.    Assessment & Plan:  History of prostate cancer.  PSA in 6 months.   Testicular atrophy.  His T level was 180.  I will get a repeat level with an FSH/LH and prolactin at f/u.   Renal stone.  He has slight interval growth but doesn't want intervention at this time.  I will reassess with a KUB in 6 months. ..    No orders of the defined types were placed in this encounter.    Orders Placed This Encounter  Procedures  . Microscopic Examination  . DG Abd 1 View    Standing Status:   Future    Standing Expiration Date:   12/25/2021    Order Specific Question:   Reason for Exam (SYMPTOM  OR DIAGNOSIS REQUIRED)    Answer:   renal stone    Order Specific Question:   Preferred imaging location?    Answer:   [facility]    Order Specific Question:   Radiology Contrast Protocol - do NOT remove file path    Answer:   [email or web address]  . Urinalysis, Routine w reflex microscopic  . PSA    Standing  Status:   Future    Standing Expiration Date:   12/25/2021  . Testosterone    Standing Status:   Future    Standing Expiration Date:   12/25/2021  . Luteinizing hormone    Standing Status:   Future    Standing Expiration Date:   12/25/2021  . Follicle stimulating hormone    Standing Status:   Future    Standing Expiration Date:   12/25/2021  . Prolactin    Standing Status:   Future    Standing Expiration Date:   12/25/2021      Return in about 6 months (around 06/26/2021) for With KUB and labs. .   CC: Leeanne Rio, MD      Daniel Schaefer 12/25/2020 Patient ID: Daniel Schaefer, male   DOB: [birth year], 70 y.o.   MRN: [id number]

## 2021-01-16 ENCOUNTER — Ambulatory Visit: Payer: Medicare Other | Admitting: Cardiology

## 2021-01-20 ENCOUNTER — Other Ambulatory Visit: Payer: Self-pay | Admitting: "Endocrinology

## 2021-01-20 ENCOUNTER — Other Ambulatory Visit: Payer: Self-pay | Admitting: Urology

## 2021-01-20 DIAGNOSIS — N200 Calculus of kidney: Secondary | ICD-10-CM

## 2021-02-03 ENCOUNTER — Ambulatory Visit (INDEPENDENT_AMBULATORY_CARE_PROVIDER_SITE_OTHER): Payer: Medicare Other | Admitting: *Deleted

## 2021-02-03 DIAGNOSIS — I4891 Unspecified atrial fibrillation: Secondary | ICD-10-CM | POA: Diagnosis not present

## 2021-02-03 DIAGNOSIS — I4821 Permanent atrial fibrillation: Secondary | ICD-10-CM | POA: Diagnosis not present

## 2021-02-03 DIAGNOSIS — Z5181 Encounter for therapeutic drug level monitoring: Secondary | ICD-10-CM | POA: Diagnosis not present

## 2021-02-03 LAB — POCT INR: INR: 3.3 — AB (ref 2.0–3.0)

## 2021-02-03 NOTE — Patient Instructions (Signed)
Hold warfarin tonight then resume 1 tablet daily except 1/2 tablet on Wednesdays.   Continue greens Recheck in 5 weeks

## 2021-02-04 ENCOUNTER — Other Ambulatory Visit: Payer: Self-pay

## 2021-02-04 ENCOUNTER — Ambulatory Visit: Payer: Medicare Other | Admitting: Cardiology

## 2021-02-04 ENCOUNTER — Encounter: Payer: Self-pay | Admitting: Cardiology

## 2021-02-04 VITALS — BP 142/90 | HR 58 | Ht 70.5 in | Wt 237.0 lb

## 2021-02-04 DIAGNOSIS — D6869 Other thrombophilia: Secondary | ICD-10-CM | POA: Diagnosis not present

## 2021-02-04 DIAGNOSIS — I4821 Permanent atrial fibrillation: Secondary | ICD-10-CM

## 2021-02-04 DIAGNOSIS — I10 Essential (primary) hypertension: Secondary | ICD-10-CM | POA: Diagnosis not present

## 2021-02-04 NOTE — Progress Notes (Signed)
Cardiology Office Note  Date: 02/04/2021   ID: Daniel Schaefer, Daniel Schaefer 27-Feb-1951, MRN [id number]  PCP:  Leeanne Rio, MD  Cardiologist:  Rozann Lesches, MD Electrophysiologist:  None   Chief Complaint  Patient presents with  . Cardiac follow-up    History of Present Illness: Daniel Schaefer is a 70 y.o. male last seen in November 2021.  He is here for a routine visit.  Reports no palpitations or chest pain.  He has gained a fair amount of weight during the pandemic, admits that he has not been as active.  We discussed diet and a walking plan.  He is also in the process of changing primary care provider to [place].  He continues on Coumadin with follow-up in the anticoagulation clinic.  Recent INR was 3.3.  No spontaneous bleeding problems.  I personally reviewed his ECG today which shows rate controlled atrial fibrillation, R' in lead V1 and V2.  I reviewed his medications which are outlined below.  Past Medical History:  Diagnosis Date  . Atrial fibrillation [facility])    Reportedly diagnosed November 2011  . Essential hypertension   . History of kidney stones   . Prostate cancer [facility])    Radiation implants  . Type 2 diabetes mellitus ([place])     Past Surgical History:  Procedure Laterality Date  . Gold seed placement    . INGUINAL HERNIA REPAIR Left 07/02/2020   Procedure: LEFT INGUINAL HERNIORRHAPY WITH MESH;  Surgeon: Aviva Signs, MD;  Location: [facility];  Service: General;  Laterality: Left;  . LITHOTRIPSY    . PROSTATE BIOPSY    . TRIGGER FINGER RELEASE Left    ring finger  . URETEROSCOPY WITH HOLMIUM LASER LITHOTRIPSY      Current Outpatient Medications  Medication Sig Dispense Refill  . amLODipine (NORVASC) 5 MG tablet Take 1 tablet (5 mg total) by mouth daily. 90 tablet 1  . Cholecalciferol (VITAMIN D3) 125 MCG (5000 UT) TABS Take 5,000 Units by mouth daily.     . furosemide (LASIX) 20 MG tablet Take 1 tablet by mouth once daily 90 tablet 3  . GLOBAL  EASE INJECT PEN NEEDLES 31G X 8 MM MISC USE 1 DAILY AS DIRECTED - RUN ON CASH $15 - PER PC. 100 each 5  . glucose blood test strip Use as instructed 100 each 12  . Insulin Glargine (BASAGLAR KWIKPEN) 100 UNIT/ML INJECT 64 UNITS INTO THE SKIN AT BEDTIME (Patient taking differently: Inject 64 Units into the skin at bedtime.) 10 pen 2  . Insulin Pen Needle (B-D ULTRAFINE III SHORT PEN) 31G X 8 MM MISC 1 each by Does not apply route as directed. 50 each 2  . Lancets MISC 1 each by Does not apply route as directed. 100 each 3  . lisinopril (ZESTRIL) 20 MG tablet Take 1 tablet (20 mg total) by mouth daily. 90 tablet 1  . loratadine (CLARITIN) 10 MG tablet Take 10 mg by mouth daily.    . melatonin 5 MG TABS Take 5 mg by mouth.    . metFORMIN (GLUCOPHAGE) 500 MG tablet TAKE 1 TABLET BY MOUTH ONCE DAILY AFTER  SUPPER 90 tablet 0  . metoprolol tartrate (LOPRESSOR) 25 MG tablet Take 1 tablet (25 mg total) by mouth 2 (two) times daily. 180 tablet 1  . omeprazole (PRILOSEC) 20 MG capsule Take 20 mg by mouth daily.     Marland Kitchen oxymetazoline (AFRIN) 0.05 % nasal spray Place 1 spray into both nostrils 2 (  two) times daily as needed for congestion.    . potassium chloride (KLOR-CON) 10 MEQ tablet Take 1 tablet by mouth once daily 90 tablet 3  . pravastatin (PRAVACHOL) 40 MG tablet TAKE 1 TABLET BY MOUTH AT BEDTIME 90 tablet 1  . tadalafil (CIALIS) 20 MG tablet Take 20 mg by mouth daily as needed for erectile dysfunction.    . tamsulosin (FLOMAX) 0.4 MG CAPS capsule Take 1 capsule by mouth once daily 30 capsule 0  . warfarin (COUMADIN) 5 MG tablet TAKE 1 TABLET BY MOUTH ONCE DAILY EXCEPT 1/2 TABLET ON WEDNESDAYS  OR  AS  DIRECTED 35 tablet 2   No current facility-administered medications for this visit.   Allergies:  Bactrim [sulfamethoxazole-trimethoprim], Other, and Tresiba flextouch [insulin degludec]   ROS: No palpitations or syncope.  Intermittent lower leg edema.  Physical Exam: VS:  BP (!) 142/90   Pulse (!)  58   Ht 5' 10.5" (1.791 m)   Wt 237 lb (107.5 kg)   SpO2 98%   BMI 33.53 kg/m , BMI Body mass index is 33.53 kg/m.  Wt Readings from Last 3 Encounters:  02/04/21 237 lb (107.5 kg)  10/02/20 231 lb 9.6 oz (105.1 kg)  09/25/20 228 lb (103.4 kg)    General: Patient appears comfortable at rest. HEENT: Conjunctiva and lids normal, wearing a mask. Neck: Supple, no elevated JVP or carotid bruits, no thyromegaly. Lungs: Clear to auscultation, nonlabored breathing at rest. Cardiac: Irregularly irregular, no S3 or significant systolic murmur, no pericardial rub. Extremities: No pitting edema.  ECG:  An ECG dated 01/09/2020 was personally reviewed today and demonstrated:  Rate controlled atrial fibrillation with low voltage.  Recent Labwork: 03/24/2020: ALT 15; AST 14 07/01/2020: Potassium 3.7; Sodium 139 09/25/2020: BUN 24; Creatinine 1.4     Component Value Date/Time   CHOL 142 03/24/2020 0000   TRIG 107 03/24/2020 0000   HDL 40 03/24/2020 0000   LDLCALC 81 03/24/2020 0000  January 2022: Potassium 3.9, BUN 24, creatinine 1.36, AST 14, ALT 21  Other Studies Reviewed Today:  Echocardiogram 04/04/2013: Study Conclusions  - Left ventricle: The cavity size was normal. Wall thickness was increased in a pattern of mild LVH. Systolic function was normal. The estimated ejection fraction was in the range of 50% to 55%. Wall motion was normal; there were no regional wall motion abnormalities. The study is not technically sufficient to allow evaluation of LV diastolic function, due to underlying atrial fibrillation. - Left atrium: The atrium was mildly dilated. - Pulmonic valve: Mild regurgitation.  Assessment and Plan:  1.  Permanent atrial fibrillation with CHA2DS2-VASc score of 3.  He is doing well without active palpitations, heart rate controlled on Lopressor.  He continues on Coumadin for stroke prophylaxis with follow-up in anticoagulation clinic.  2.  Acquired  thrombophilia, on Coumadin for stroke prophylaxis.  No reported bleeding problems.  I reviewed his lab work from January.  3.  Essential hypertension, continue Norvasc and lisinopril.  I also talked with him about diet and weight loss as well as a walking plan.  Medication Adjustments/Labs and Tests Ordered: Current medicines are reviewed at length with the patient today.  Concerns regarding medicines are outlined above.   Tests Ordered: Orders Placed This Encounter  Procedures  . EKG 12-Lead    Medication Changes: No orders of the defined types were placed in this encounter.   Disposition:  Follow up 6 months.  Signed, Satira Sark, MD, [facility] 02/04/2021 9:52 AM  [facility] [address], [place], [place] [address] Phone: [phone]; Fax: [phone]

## 2021-02-04 NOTE — Patient Instructions (Signed)

## 2021-02-12 ENCOUNTER — Other Ambulatory Visit: Payer: Self-pay | Admitting: Cardiology

## 2021-02-18 ENCOUNTER — Other Ambulatory Visit: Payer: Self-pay | Admitting: Cardiology

## 2021-02-18 ENCOUNTER — Other Ambulatory Visit: Payer: Self-pay | Admitting: Urology

## 2021-02-18 DIAGNOSIS — N200 Calculus of kidney: Secondary | ICD-10-CM

## 2021-02-23 ENCOUNTER — Other Ambulatory Visit: Payer: Self-pay | Admitting: Cardiology

## 2021-02-26 LAB — HEPATIC FUNCTION PANEL
ALT: 14 (ref 10–40)
AST: 20 (ref 14–40)
Alkaline Phosphatase: 75 (ref 25–125)
Bilirubin, Total: 0.2

## 2021-02-26 LAB — BASIC METABOLIC PANEL
BUN: 18 (ref 4–21)
CO2: 23 — AB (ref 13–22)
Chloride: 102 (ref 99–108)
Creatinine: 1.4 — AB (ref 0.6–1.3)
Glucose: 138
Potassium: 4.3 (ref 3.4–5.3)
Sodium: 141 (ref 137–147)

## 2021-02-26 LAB — COMPREHENSIVE METABOLIC PANEL
Albumin: 4.3 (ref 3.5–5.0)
Calcium: 9.1 (ref 8.7–10.7)
Globulin: 2.3

## 2021-02-26 LAB — LIPID PANEL
Cholesterol: 159 (ref 0–200)
HDL: 38 (ref 35–70)
LDL Cholesterol: 78
Triglycerides: 259 — AB (ref 40–160)

## 2021-02-26 LAB — HEMOGLOBIN A1C: Hemoglobin A1C: 8.5

## 2021-03-10 ENCOUNTER — Ambulatory Visit (INDEPENDENT_AMBULATORY_CARE_PROVIDER_SITE_OTHER): Payer: Medicare Other | Admitting: *Deleted

## 2021-03-10 DIAGNOSIS — I4891 Unspecified atrial fibrillation: Secondary | ICD-10-CM

## 2021-03-10 DIAGNOSIS — Z5181 Encounter for therapeutic drug level monitoring: Secondary | ICD-10-CM

## 2021-03-10 DIAGNOSIS — I4821 Permanent atrial fibrillation: Secondary | ICD-10-CM | POA: Diagnosis not present

## 2021-03-10 LAB — POCT INR: INR: 2.3 (ref 2.0–3.0)

## 2021-03-10 NOTE — Patient Instructions (Signed)
Continue warfarin 1 tablet daily except 1/2 tablet on Wednesdays.   Continue greens Recheck in 6 weeks  

## 2021-04-01 NOTE — Patient Instructions (Signed)
Diabetes Mellitus and Nutrition, Adult When you have diabetes, or diabetes mellitus, it is very important to have healthy eating habits because your blood sugar (glucose) levels are greatly affected by what you eat and drink. Eating healthy foods in the right amounts, at about the same times every day, can help you:  Control your blood glucose.  Lower your risk of heart disease.  Improve your blood pressure.  Reach or maintain a healthy weight. What can affect my meal plan? Every person with diabetes is different, and each person has different needs for a meal plan. Your health care provider may recommend that you work with a dietitian to make a meal plan that is best for you. Your meal plan may vary depending on factors such as:  The calories you need.  The medicines you take.  Your weight.  Your blood glucose, blood pressure, and cholesterol levels.  Your activity level.  Other health conditions you have, such as heart or kidney disease. How do carbohydrates affect me? Carbohydrates, also called carbs, affect your blood glucose level more than any other type of food. Eating carbs naturally raises the amount of glucose in your blood. Carb counting is a method for keeping track of how many carbs you eat. Counting carbs is important to keep your blood glucose at a healthy level, especially if you use insulin or take certain oral diabetes medicines. It is important to know how many carbs you can safely have in each meal. This is different for every person. Your dietitian can help you calculate how many carbs you should have at each meal and for each snack. How does alcohol affect me? Alcohol can cause a sudden decrease in blood glucose (hypoglycemia), especially if you use insulin or take certain oral diabetes medicines. Hypoglycemia can be a life-threatening condition. Symptoms of hypoglycemia, such as sleepiness, dizziness, and confusion, are similar to symptoms of having too much  alcohol.  Do not drink alcohol if: ? Your health care provider tells you not to drink. ? You are pregnant, may be pregnant, or are planning to become pregnant.  If you drink alcohol: ? Do not drink on an empty stomach. ? Limit how much you use to:  0-1 drink a day for women.  0-2 drinks a day for men. ? Be aware of how much alcohol is in your drink. In the [place] one drink equals one 12 oz bottle of beer (355 mL), one 5 oz glass of wine (148 mL), or one 1 oz glass of hard liquor (44 mL). ? Keep yourself hydrated with water, diet soda, or unsweetened iced tea.  Keep in mind that regular soda, juice, and other mixers may contain a lot of sugar and must be counted as carbs. What are tips for following this plan? Reading food labels  Start by checking the serving size on the "Nutrition Facts" label of packaged foods and drinks. The amount of calories, carbs, fats, and other nutrients listed on the label is based on one serving of the item. Many items contain more than one serving per package.  Check the total grams (g) of carbs in one serving. You can calculate the number of servings of carbs in one serving by dividing the total carbs by 15. For example, if a food has 30 g of total carbs per serving, it would be equal to 2 servings of carbs.  Check the number of grams (g) of saturated fats and trans fats in one serving. Choose foods that have   a low amount or none of these fats.  Check the number of milligrams (mg) of salt (sodium) in one serving. Most people should limit total sodium intake to less than 2,300 mg per day.  Always check the nutrition information of foods labeled as "low-fat" or "nonfat." These foods may be higher in added sugar or refined carbs and should be avoided.  Talk to your dietitian to identify your daily goals for nutrients listed on the label. Shopping  Avoid buying canned, pre-made, or processed foods. These foods tend to be high in fat, sodium, and added  sugar.  Shop around the outside edge of the grocery store. This is where you will most often find fresh fruits and vegetables, bulk grains, fresh meats, and fresh dairy. Cooking  Use low-heat cooking methods, such as baking, instead of high-heat cooking methods like deep frying.  Cook using healthy oils, such as olive, canola, or sunflower oil.  Avoid cooking with butter, cream, or high-fat meats. Meal planning  Eat meals and snacks regularly, preferably at the same times every day. Avoid going long periods of time without eating.  Eat foods that are high in fiber, such as fresh fruits, vegetables, beans, and whole grains. Talk with your dietitian about how many servings of carbs you can eat at each meal.  Eat 4-6 oz (112-168 g) of lean protein each day, such as lean meat, chicken, fish, eggs, or tofu. One ounce (oz) of lean protein is equal to: ? 1 oz (28 g) of meat, chicken, or fish. ? 1 egg. ?  cup (62 g) of tofu.  Eat some foods each day that contain healthy fats, such as avocado, nuts, seeds, and fish.   What foods should I eat? Fruits Berries. Apples. Oranges. Peaches. Apricots. Plums. Grapes. Mango. Papaya. Pomegranate. Kiwi. Cherries. Vegetables Lettuce. Spinach. Leafy greens, including kale, chard, collard greens, and mustard greens. Beets. Cauliflower. Cabbage. Broccoli. Carrots. Green beans. Tomatoes. Peppers. Onions. Cucumbers. Brussels sprouts. Grains Whole grains, such as whole-wheat or whole-grain bread, crackers, tortillas, cereal, and pasta. Unsweetened oatmeal. Quinoa. Brown or wild rice. Meats and other proteins Seafood. Poultry without skin. Lean cuts of poultry and beef. Tofu. Nuts. Seeds. Dairy Low-fat or fat-free dairy products such as milk, yogurt, and cheese. The items listed above may not be a complete list of foods and beverages you can eat. Contact a dietitian for more information. What foods should I avoid? Fruits Fruits canned with  syrup. Vegetables Canned vegetables. Frozen vegetables with butter or cream sauce. Grains Refined white flour and flour products such as bread, pasta, snack foods, and cereals. Avoid all processed foods. Meats and other proteins Fatty cuts of meat. Poultry with skin. Breaded or fried meats. Processed meat. Avoid saturated fats. Dairy Full-fat yogurt, cheese, or milk. Beverages Sweetened drinks, such as soda or iced tea. The items listed above may not be a complete list of foods and beverages you should avoid. Contact a dietitian for more information. Questions to ask a health care provider  Do I need to meet with a diabetes educator?  Do I need to meet with a dietitian?  What number can I call if I have questions?  When are the best times to check my blood glucose? Where to find more information:  American Diabetes Association: diabetes.org  Academy of Nutrition and Dietetics: www.eatright.org  National Institute of Diabetes and Digestive and Kidney Diseases: www.niddk.nih.gov  Association of Diabetes Care and Education Specialists: www.diabeteseducator.org Summary  It is important to have healthy eating   habits because your blood sugar (glucose) levels are greatly affected by what you eat and drink.  A healthy meal plan will help you control your blood glucose and maintain a healthy lifestyle.  Your health care provider may recommend that you work with a dietitian to make a meal plan that is best for you.  Keep in mind that carbohydrates (carbs) and alcohol have immediate effects on your blood glucose levels. It is important to count carbs and to use alcohol carefully. This information is not intended to replace advice given to you by your health care provider. Make sure you discuss any questions you have with your health care provider. Document Revised: 08/07/2019 Document Reviewed: 08/07/2019 Elsevier Patient Education  2021 [employer] Inc.  

## 2021-04-02 ENCOUNTER — Other Ambulatory Visit: Payer: Self-pay

## 2021-04-02 ENCOUNTER — Ambulatory Visit: Payer: Medicare Other | Admitting: Nurse Practitioner

## 2021-04-02 ENCOUNTER — Encounter: Payer: Self-pay | Admitting: Nurse Practitioner

## 2021-04-02 VITALS — BP 131/80 | HR 67 | Ht 70.5 in | Wt 237.0 lb

## 2021-04-02 DIAGNOSIS — E1159 Type 2 diabetes mellitus with other circulatory complications: Secondary | ICD-10-CM | POA: Diagnosis not present

## 2021-04-02 DIAGNOSIS — E782 Mixed hyperlipidemia: Secondary | ICD-10-CM | POA: Diagnosis not present

## 2021-04-02 DIAGNOSIS — I10 Essential (primary) hypertension: Secondary | ICD-10-CM

## 2021-04-02 DIAGNOSIS — Z794 Long term (current) use of insulin: Secondary | ICD-10-CM | POA: Diagnosis not present

## 2021-04-02 NOTE — Progress Notes (Signed)
04/02/2021  Endocrinology follow-up note   Subjective:    Patient ID: Daniel Schaefer, male    DOB: 1950-10-22,    Past Medical History:  Diagnosis Date   Atrial fibrillation [facility])    Reportedly diagnosed November 2011   Essential hypertension    History of kidney stones    Prostate cancer [facility])    Radiation implants   Type 2 diabetes mellitus ([place])    Past Surgical History:  Procedure Laterality Date   Gold seed placement     INGUINAL HERNIA REPAIR Left 07/02/2020   Procedure: LEFT INGUINAL HERNIORRHAPY WITH MESH;  Surgeon: Aviva Signs, MD;  Location: [facility];  Service: General;  Laterality: Left;   LITHOTRIPSY     PROSTATE BIOPSY     TRIGGER FINGER RELEASE Left    ring finger   URETEROSCOPY WITH HOLMIUM LASER LITHOTRIPSY     Social History   Socioeconomic History   Marital status: Divorced    Spouse name: Not on file   Number of children: Not on file   Years of education: Not on file   Highest education level: Not on file  Occupational History   Not on file  Tobacco Use   Smoking status: Never   Smokeless tobacco: Never  Vaping Use   Vaping Use: Never used  Substance and Sexual Activity   Alcohol use: No    Alcohol/week: 0.0 standard drinks   Drug use: No   Sexual activity: Yes  Other Topics Concern   Not on file  Social History Narrative   Not on file   Social Determinants of Health   Financial Resource Strain: Not on file  Food Insecurity: Not on file  Transportation Needs: Not on file  Physical Activity: Not on file  Stress: Not on file  Social Connections: Not on file   Outpatient Encounter Medications as of 04/02/2021  Medication Sig   amLODipine (NORVASC) 5 MG tablet Take 1 tablet by mouth once daily   Azelastine HCl 137 MCG/SPRAY SOLN Place 2 sprays into both nostrils daily.   Cholecalciferol (VITAMIN D3) 125 MCG (5000 UT) TABS Take 5,000 Units by mouth daily.    furosemide (LASIX) 20 MG tablet Take 1 tablet by mouth once daily   GLOBAL  EASE INJECT PEN NEEDLES 31G X 8 MM MISC USE 1 DAILY AS DIRECTED - RUN ON CASH $15 - PER PC.   glucose blood test strip Use as instructed   Insulin Glargine (BASAGLAR KWIKPEN) 100 UNIT/ML INJECT 64 UNITS INTO THE SKIN AT BEDTIME (Patient taking differently: Inject 64 Units into the skin at bedtime.)   Insulin Pen Needle (B-D ULTRAFINE III SHORT PEN) 31G X 8 MM MISC 1 each by Does not apply route as directed.   Lancets MISC 1 each by Does not apply route as directed.   lisinopril (ZESTRIL) 20 MG tablet Take 1 tablet (20 mg total) by mouth daily.   loratadine (CLARITIN) 10 MG tablet Take 10 mg by mouth daily.   melatonin 5 MG TABS Take 5 mg by mouth.   metFORMIN (GLUCOPHAGE) 500 MG tablet TAKE 1 TABLET BY MOUTH ONCE DAILY AFTER  SUPPER   metoprolol tartrate (LOPRESSOR) 25 MG tablet Take 1 tablet (25 mg total) by mouth 2 (two) times daily.   omeprazole (PRILOSEC) 20 MG capsule Take 20 mg by mouth daily.    oxymetazoline (AFRIN) 0.05 % nasal spray Place 1 spray into both nostrils 2 (two) times daily as needed for congestion.   potassium chloride (KLOR-CON)  10 MEQ tablet Take 1 tablet by mouth once daily   pravastatin (PRAVACHOL) 40 MG tablet TAKE 1 TABLET BY MOUTH AT BEDTIME   tadalafil (CIALIS) 20 MG tablet Take 20 mg by mouth daily as needed for erectile dysfunction.   tamsulosin (FLOMAX) 0.4 MG CAPS capsule Take 1 capsule by mouth once daily   warfarin (COUMADIN) 5 MG tablet TAKE 1 TABLET BY MOUTH ONCE DAILY EXCEPT  1/2  TABLET  ON  WEDNESDAYS  OR  AS  DIRECTED   No facility-administered encounter medications on file as of 04/02/2021.   ALLERGIES: Allergies  Allergen Reactions   Bactrim [Sulfamethoxazole-Trimethoprim] Other (See Comments)    Thin his blood really bad   Other     Cannot mix warfarin and bactrim    Tresiba Flextouch [Insulin Degludec] Rash   VACCINATION STATUS:  There is no immunization history on file for this patient.  Diabetes He presents for his follow-up diabetic  visit. He has type 2 diabetes mellitus. Onset time: He was diagnosed at approximate age of 17 years. His disease course has been worsening. There are no hypoglycemic associated symptoms. There are no diabetic associated symptoms. There are no hypoglycemic complications. Symptoms are stable. Diabetic complications include nephropathy. Risk factors for coronary artery disease include hypertension, dyslipidemia, sedentary lifestyle, male sex and diabetes mellitus. Current diabetic treatment includes oral agent (monotherapy) and insulin injections. He is compliant with treatment most of the time. His weight is increasing steadily. He is following a generally unhealthy diet. When asked about meal planning, he reported none. He has not had a previous visit with a dietitian. He rarely participates in exercise. His home blood glucose trend is fluctuating minimally. His overall blood glucose range is >200 mg/dl. (He presents today with his meter and logs showing fluctuating glycemic profile.  His previsit A1c (checked at his PCP office) was 8.5% on 02/26/21, worsening from last visit of 8%.  He reports he has not be eating as well or exercising as he should lately and he plans to get back on track.  Analysis of his meter shows 7-day average of 214, 14-day average of 217, 30-day average of 205.  He denies any significant hypoglycemia.) An ACE inhibitor/angiotensin II receptor blocker is being taken. He does not see a podiatrist.Eye exam is current.  Hypertension This is a chronic problem. The current episode started more than 1 year ago. The problem is unchanged. The problem is controlled. There are no associated agents to hypertension. Risk factors for coronary artery disease include diabetes mellitus, dyslipidemia, male gender and sedentary lifestyle. Past treatments include ACE inhibitors, calcium channel blockers, diuretics and beta blockers. The current treatment provides moderate improvement. There are no compliance  problems.  Hypertensive end-organ damage includes kidney disease. Identifiable causes of hypertension include chronic renal disease.  Hyperlipidemia This is a chronic problem. The current episode started more than 1 year ago. The problem is controlled. Recent lipid tests were reviewed and are normal. Exacerbating diseases include chronic renal disease and diabetes. Factors aggravating his hyperlipidemia include beta blockers. Current antihyperlipidemic treatment includes statins. The current treatment provides moderate improvement of lipids. There are no compliance problems.  Risk factors for coronary artery disease include dyslipidemia, diabetes mellitus, hypertension, male sex and a sedentary lifestyle.   Review of systems  Constitutional: + Minimally fluctuating body weight,  current Body mass index is 33.53 kg/m. , no fatigue, no subjective hyperthermia, no subjective hypothermia Eyes: no blurry vision, no xerophthalmia ENT: no sore throat, no nodules palpated  in throat, no dysphagia/odynophagia, no hoarseness Cardiovascular: no chest pain, no shortness of breath, no palpitations, no leg swelling Respiratory: no cough, no shortness of breath Gastrointestinal: no nausea/vomiting/diarrhea Musculoskeletal: no muscle/joint aches Skin: no rashes, no hyperemia Neurological: no tremors, no numbness, no tingling, no dizziness Psychiatric: no depression, no anxiety   Objective:    BP 131/80   Pulse 67   Ht 5' 10.5" (1.791 m)   Wt 237 lb (107.5 kg)   BMI 33.53 kg/m   Wt Readings from Last 3 Encounters:  04/02/21 237 lb (107.5 kg)  02/04/21 237 lb (107.5 kg)  10/02/20 231 lb 9.6 oz (105.1 kg)    BP Readings from Last 3 Encounters:  04/02/21 131/80  02/04/21 (!) 142/90  12/25/20 (!) 155/91     Physical Exam- Limited  Constitutional:  Body mass index is 33.53 kg/m. , not in acute distress, normal state of mind Eyes:  EOMI, no exophthalmos Neck: Supple Cardiovascular: RRR, no  murmurs, rubs, or gallops, no edema Respiratory: Adequate breathing efforts, no crackles, rales, rhonchi, or wheezing Musculoskeletal: no gross deformities, strength intact in all four extremities, no gross restriction of joint movements Skin:  no rashes, no hyperemia Neurological: no tremor with outstretched hands    Results for orders placed or performed in visit on 03/10/21  POCT INR  Result Value Ref Range   INR 2.3 2.0 - 3.0   Complete Blood Count (Most recent): Lab Results  Component Value Date   WBC 6.6 08/31/2013   HGB 14.0 08/31/2013   HCT 40.1 08/31/2013   MCV 75.9 (L) 08/31/2013   PLT 235 08/31/2013   Chemistry (most recent): Lab Results  Component Value Date   NA 139 07/01/2020   K 3.7 07/01/2020   CL 102 07/01/2020   CO2 26 07/01/2020   BUN 24 (A) 09/25/2020   CREATININE 1.4 (A) 09/25/2020   Lipid Panel     Component Value Date/Time   CHOL 142 03/24/2020 0000   TRIG 107 03/24/2020 0000   HDL 40 03/24/2020 0000   LDLCALC 81 03/24/2020 0000    Assessment & Plan:   1) Type 2 diabetes mellitus with stage 3 renal insufficiency, with long-term current use of insulin ([place])  He presents today with his meter and logs showing fluctuating glycemic profile.  His previsit A1c (checked at his PCP office) was 8.5% on 02/26/21, worsening from last visit of 8%.  He reports he has not be eating as well or exercising as he should lately and he plans to get back on track.  Analysis of his meter shows 7-day average of 214, 14-day average of 217, 30-day average of 205.  He denies any significant hypoglycemia.  -Patient remains at a high risk for more acute and chronic complications of diabetes which include CAD, CVA, CKD, retinopathy, and neuropathy. These are all discussed in detail with the patient.  -Recent labs reviewed showing improved renal function.  - Nutritional counseling repeated at each appointment due to patients tendency to fall back in to old habits.  - The  patient admits there is a room for improvement in their diet and drink choices. -  Suggestion is made for the patient to avoid simple carbohydrates from their diet including Cakes, Sweet Desserts / Pastries, Ice Cream, Soda (diet and regular), Sweet Tea, Candies, Chips, Cookies, Sweet Pastries, Store Bought Juices, Alcohol in Excess of 1-2 drinks a day, Artificial Sweeteners, Coffee Creamer, and "Sugar-free" Products. This will help patient to have stable blood glucose profile  and potentially avoid unintended weight gain.   - I encouraged the patient to switch to unprocessed or minimally processed complex starch and increased protein intake (animal or plant source), fruits, and vegetables.   - Patient is advised to stick to a routine mealtimes to eat 3 meals a day and avoid unnecessary snacks (to snack only to correct hypoglycemia).  -He is following with Jearld Fenton, CDE for diabetes education.  - Based on his worsening profile, he is approached to start incretin therapy with Ozempic or Trulicity.  He is concerned with cost and would like to see if it is something he can afford prior to trying it.  He is advised to continue his Basaglar 64 units SQ nightly and Metformin 500 mg daily with supper.     -He is encouraged to continue monitoring blood glucose twice daily, before breakfast and before bed, and to call the clinic if he has readings less than 70 or greater than 200 for 3 tests in a row.  - He is generally hesitant to add medications.  - Patient specific target  for A1c; LDL, HDL, Triglycerides, and  Waist Circumference were discussed in detail.  2) BP/HTN: His blood pressure is controlled to target for his age.  He is advised to continue Norvasc 5 mg po daily, Lasix 20 mg po daily, Lisinopril 20 mg po daily, and Metoprolol 25 mg po twice daily.  3) Lipids/HPL:  His most recent lipid panel from 03/24/20 shows controlled LDL at 81.  He is advised to continue Pravastatin 40 mg po daily at  bedtime.  Side effects and precautions discussed with him.  He recently had his labs drawn at his PCP, will request copy.  4)  Weight/Diet:  His Body mass index is 33.53 kg/m.-candidate for some weight loss.  He is following with Jearld Fenton, CD for diabetes education.  No success in weight loss,  exercise, and carbohydrates information provided.  5) Chronic Care/Health Maintenance: -Patient on ACEI and Statin medications and encouraged to continue to follow up with Ophthalmology, Podiatrist at least yearly or according to recommendations, and advised to stay away from smoking. I have recommended yearly flu vaccine and pneumonia vaccination at least every 5 years; moderate intensity exercise for up to 150 minutes weekly; and  sleep for at least 7 hours a day.   He is advised to maintain close follow-up with his PMD Dr. Huel Cote.    I spent 35 minutes in the care of the patient today including review of labs from [place], Lipids, Thyroid Function, Hematology (current and previous including abstractions from other facilities); face-to-face time discussing  his blood glucose readings/logs, discussing hypoglycemia and hyperglycemia episodes and symptoms, medications doses, his options of short and long term treatment based on the latest standards of care / guidelines;  discussion about incorporating lifestyle medicine;  and documenting the encounter.    Please refer to Patient Instructions for Blood Glucose Monitoring and Insulin/Medications Dosing Guide"  in media tab for additional information. Please  also refer to " Patient Self Inventory" in the Media  tab for reviewed elements of pertinent patient history.  Daniel Schaefer participated in the discussions, expressed understanding, and voiced agreement with the above plans.  All questions were answered to his satisfaction. he is encouraged to contact clinic should he have any questions or concerns prior to his return visit.   Follow up  plan: Return in about 6 months (around 10/03/2021) for Diabetes F/U- A1c and UM in office, No previsit  labs, Bring meter and logs.  Rayetta Pigg, [facility] [address] [place], [place] [address] Phone: [phone] Fax: [phone]  04/02/2021, 12:27 PM

## 2021-04-12 ENCOUNTER — Other Ambulatory Visit: Payer: Self-pay | Admitting: "Endocrinology

## 2021-04-19 ENCOUNTER — Other Ambulatory Visit: Payer: Self-pay | Admitting: Cardiology

## 2021-04-21 ENCOUNTER — Ambulatory Visit (INDEPENDENT_AMBULATORY_CARE_PROVIDER_SITE_OTHER): Payer: Medicare Other | Admitting: *Deleted

## 2021-04-21 DIAGNOSIS — I4821 Permanent atrial fibrillation: Secondary | ICD-10-CM

## 2021-04-21 DIAGNOSIS — I4891 Unspecified atrial fibrillation: Secondary | ICD-10-CM

## 2021-04-21 DIAGNOSIS — Z5181 Encounter for therapeutic drug level monitoring: Secondary | ICD-10-CM

## 2021-04-21 LAB — POCT INR: INR: 2.5 (ref 2.0–3.0)

## 2021-04-21 NOTE — Patient Instructions (Signed)
Continue warfarin 1 tablet daily except 1/2 tablet on Wednesdays.   Continue greens Recheck in 6 weeks  

## 2021-05-05 DIAGNOSIS — I4891 Unspecified atrial fibrillation: Secondary | ICD-10-CM | POA: Diagnosis not present

## 2021-05-05 DIAGNOSIS — I10 Essential (primary) hypertension: Secondary | ICD-10-CM | POA: Diagnosis not present

## 2021-05-05 DIAGNOSIS — E1169 Type 2 diabetes mellitus with other specified complication: Secondary | ICD-10-CM | POA: Diagnosis not present

## 2021-05-05 DIAGNOSIS — I509 Heart failure, unspecified: Secondary | ICD-10-CM | POA: Diagnosis not present

## 2021-05-19 DIAGNOSIS — E1169 Type 2 diabetes mellitus with other specified complication: Secondary | ICD-10-CM | POA: Diagnosis not present

## 2021-05-20 ENCOUNTER — Other Ambulatory Visit: Payer: Self-pay | Admitting: Cardiology

## 2021-06-03 ENCOUNTER — Ambulatory Visit (INDEPENDENT_AMBULATORY_CARE_PROVIDER_SITE_OTHER): Payer: Medicare Other | Admitting: *Deleted

## 2021-06-03 DIAGNOSIS — I4891 Unspecified atrial fibrillation: Secondary | ICD-10-CM | POA: Diagnosis not present

## 2021-06-03 DIAGNOSIS — Z5181 Encounter for therapeutic drug level monitoring: Secondary | ICD-10-CM | POA: Diagnosis not present

## 2021-06-03 DIAGNOSIS — I4821 Permanent atrial fibrillation: Secondary | ICD-10-CM

## 2021-06-03 LAB — POCT INR: INR: 3.2 — AB (ref 2.0–3.0)

## 2021-06-03 NOTE — Patient Instructions (Signed)
Hold warfarin tonight then resume 1 tablet daily except 1/2 tablet on Wednesdays.   Continue greens Recheck in 6 weeks

## 2021-06-08 ENCOUNTER — Other Ambulatory Visit: Payer: Self-pay | Admitting: *Deleted

## 2021-06-08 MED ORDER — POTASSIUM CHLORIDE ER 10 MEQ PO TBCR
10.0000 meq | EXTENDED_RELEASE_TABLET | Freq: Every day | ORAL | 3 refills | Status: DC
Start: 2021-06-08 — End: 2021-06-09

## 2021-06-09 ENCOUNTER — Telehealth: Payer: Self-pay | Admitting: Cardiology

## 2021-06-09 MED ORDER — POTASSIUM CHLORIDE ER 10 MEQ PO TBCR: 10.0000 meq | EXTENDED_RELEASE_TABLET | Freq: Every day | ORAL | 3 refills | Status: DC

## 2021-06-09 NOTE — Telephone Encounter (Signed)
Done

## 2021-06-09 NOTE — Telephone Encounter (Signed)
Medication sent to wrong pharmacy     *STAT* If patient is at the pharmacy, call can be transferred to refill team.   1. Which medications need to be refilled? (please list name of each medication and dose if known) potassium chloride (KLOR-CON) 10 MEQ tablet  2. Which pharmacy/location (including street and city if local pharmacy) is medication to be sent to? [facility]  3. Do they need a 30 day or 90 day supply? [place]

## 2021-06-12 ENCOUNTER — Telehealth: Payer: Self-pay

## 2021-06-12 MED ORDER — METOPROLOL TARTRATE 25 MG PO TABS: 25.0000 mg | ORAL_TABLET | Freq: Two times a day (BID) | ORAL | 1 refills | Status: DC

## 2021-06-12 NOTE — Telephone Encounter (Signed)
Medication refill request for Metoprolol Tartrate 25 mg tablets approved and sent to [facility].

## 2021-06-15 DIAGNOSIS — Z23 Encounter for immunization: Secondary | ICD-10-CM | POA: Diagnosis not present

## 2021-06-15 DIAGNOSIS — I10 Essential (primary) hypertension: Secondary | ICD-10-CM | POA: Diagnosis not present

## 2021-06-15 DIAGNOSIS — I509 Heart failure, unspecified: Secondary | ICD-10-CM | POA: Diagnosis not present

## 2021-06-15 DIAGNOSIS — I4891 Unspecified atrial fibrillation: Secondary | ICD-10-CM | POA: Diagnosis not present

## 2021-06-15 DIAGNOSIS — E1169 Type 2 diabetes mellitus with other specified complication: Secondary | ICD-10-CM | POA: Diagnosis not present

## 2021-06-18 ENCOUNTER — Other Ambulatory Visit: Payer: Self-pay

## 2021-06-18 ENCOUNTER — Other Ambulatory Visit: Payer: Self-pay | Admitting: Urology

## 2021-06-18 ENCOUNTER — Other Ambulatory Visit: Payer: Medicare Other

## 2021-06-18 ENCOUNTER — Ambulatory Visit (HOSPITAL_COMMUNITY)
Admission: RE | Admit: 2021-06-18 | Discharge: 2021-06-18 | Disposition: A | Discharge status: Home / Self Care | Payer: Medicare Other | Source: Ambulatory Visit | Attending: Urology | Admitting: Urology

## 2021-06-18 DIAGNOSIS — Z8546 Personal history of malignant neoplasm of prostate: Secondary | ICD-10-CM

## 2021-06-18 DIAGNOSIS — E291 Testicular hypofunction: Secondary | ICD-10-CM

## 2021-06-18 DIAGNOSIS — N200 Calculus of kidney: Secondary | ICD-10-CM | POA: Insufficient documentation

## 2021-06-18 DIAGNOSIS — M160 Bilateral primary osteoarthritis of hip: Secondary | ICD-10-CM | POA: Diagnosis not present

## 2021-06-18 DIAGNOSIS — M47816 Spondylosis without myelopathy or radiculopathy, lumbar region: Secondary | ICD-10-CM | POA: Diagnosis not present

## 2021-06-19 LAB — PSA: Prostate Specific Ag, Serum: 0.2 ng/mL (ref 0.0–4.0)

## 2021-06-19 LAB — FOLLICLE STIMULATING HORMONE: FSH: 41.3 m[IU]/mL — ABNORMAL HIGH (ref 1.5–12.4)

## 2021-06-19 LAB — PROLACTIN: Prolactin: 28.8 ng/mL — ABNORMAL HIGH (ref 4.0–15.2)

## 2021-06-19 LAB — TESTOSTERONE: Testosterone: 132 ng/dL — ABNORMAL LOW (ref 264–916)

## 2021-06-19 LAB — LUTEINIZING HORMONE: LH: 24.2 m[IU]/mL — ABNORMAL HIGH (ref 1.7–8.6)

## 2021-06-25 ENCOUNTER — Ambulatory Visit (INDEPENDENT_AMBULATORY_CARE_PROVIDER_SITE_OTHER): Payer: Medicare Other | Admitting: Urology

## 2021-06-25 ENCOUNTER — Other Ambulatory Visit: Payer: Self-pay

## 2021-06-25 ENCOUNTER — Encounter: Payer: Self-pay | Admitting: Urology

## 2021-06-25 VITALS — BP 146/84 | HR 75 | Wt 235.4 lb

## 2021-06-25 DIAGNOSIS — E291 Testicular hypofunction: Secondary | ICD-10-CM

## 2021-06-25 DIAGNOSIS — N500 Atrophy of testis: Secondary | ICD-10-CM | POA: Diagnosis not present

## 2021-06-25 DIAGNOSIS — Z8546 Personal history of malignant neoplasm of prostate: Secondary | ICD-10-CM | POA: Diagnosis not present

## 2021-06-25 DIAGNOSIS — N200 Calculus of kidney: Secondary | ICD-10-CM

## 2021-06-25 DIAGNOSIS — R3912 Poor urinary stream: Secondary | ICD-10-CM | POA: Diagnosis not present

## 2021-06-25 NOTE — Progress Notes (Signed)
Subjective:  1. Weak urinary stream   2. History of adenocarcinoma of prostate   3. Testicular atrophy   4. Hypogonadism in male   5. Renal stones     Chong returns today in f/u with a repeat PSA.  His PSA  remains 0.2.   He has a history of prostate cancer treated with EXRT and seeds in 2003.  He has a history of ED and used tadalafil with a partial response but he isn't on it currently.  He had a testosterone level of 180 on 09/18/20 and it is 132 on 06/18/21.  His FSH and LH are elevated at 41 and 24 and his prolactin is 28.8.  He has no gynecomastia, headache or significant changes in eye sight or sense of smell.  He has had some fatigue but started some supplements and is better.  He is voiding well with an IPSS of 8.  He remains on tamsulosin.  He has a reduced stream  and nocturia x 2.  He has had some post void dribbling on occasion since the hernia repair and he has some urgency.  He didn't have a foley with the hernia repair.   He has a LLP stone that has been enlarging and was 21mm in 7/20.  It was 10.67mm in 1/22 and is now 10.54mm in 4/22 and it is now 71mm.   His left inguinal hernia was repaired on 07/02/20.   He has finally recovered from that.   IPSS     Row Name 06/25/21 1500         International Prostate Symptom Score   How often have you had the sensation of not emptying your bladder? Not at All     How often have you had to urinate less than every two hours? Less than 1 in 5 times     How often have you found you stopped and started again several times when you urinated? Not at All     How often have you found it difficult to postpone urination? Not at All     How often have you had a weak urinary stream? Almost always     How often have you had to strain to start urination? Not at All     How many times did you typically get up at night to urinate? 2 Times     Total IPSS Score 8           Quality of Life due to urinary symptoms   If you were to spend the rest of your  life with your urinary condition just the way it is now how would you feel about that? Mixed              IPSS     Row Name 06/25/21 1500         International Prostate Symptom Score   How often have you had the sensation of not emptying your bladder? Not at All     How often have you had to urinate less than every two hours? Less than 1 in 5 times     How often have you found you stopped and started again several times when you urinated? Not at All     How often have you found it difficult to postpone urination? Not at All     How often have you had a weak urinary stream? Almost always     How often have you had to strain to start urination? Not  at All     How many times did you typically get up at night to urinate? 2 Times     Total IPSS Score 8           Quality of Life due to urinary symptoms   If you were to spend the rest of your life with your urinary condition just the way it is now how would you feel about that? Mixed              ROS:  ROS:  A complete review of systems was performed.  All systems are negative except for pertinent findings as noted.   ROS  Allergies  Allergen Reactions   Bactrim [Sulfamethoxazole-Trimethoprim] Other (See Comments)    Thin his blood really bad   Other     Cannot mix warfarin and bactrim    Tresiba Flextouch [Insulin Degludec] Rash    Outpatient Encounter Medications as of 06/25/2021  Medication Sig   amLODipine (NORVASC) 5 MG tablet Take 1 tablet by mouth once daily   APPLE CIDER VINEGAR PO Take by mouth.   ASTRAGALUS PO Take by mouth.   Azelastine HCl 137 MCG/SPRAY SOLN Place 2 sprays into both nostrils daily.   Cholecalciferol (VITAMIN D3) 125 MCG (5000 UT) TABS Take 5,000 Units by mouth daily.    Flaxseed, Linseed, (FLAX SEEDS PO) Take by mouth.   furosemide (LASIX) 20 MG tablet Take 1 tablet by mouth once daily   GLOBAL EASE INJECT PEN NEEDLES 31G X 8 MM MISC USE 1 DAILY AS DIRECTED - RUN ON CASH $15 - PER PC.    glucose blood test strip Use as instructed   Insulin Glargine (BASAGLAR KWIKPEN) 100 UNIT/ML INJECT 64 UNITS INTO THE SKIN AT BEDTIME (Patient taking differently: Inject 64 Units into the skin at bedtime.)   Insulin Pen Needle (B-D ULTRAFINE III SHORT PEN) 31G X 8 MM MISC 1 each by Does not apply route as directed.   Lancets MISC 1 each by Does not apply route as directed.   lisinopril (ZESTRIL) 20 MG tablet Take 1 tablet by mouth once daily   loratadine (CLARITIN) 10 MG tablet Take 10 mg by mouth daily.   melatonin 5 MG TABS Take 5 mg by mouth.   metFORMIN (GLUCOPHAGE) 500 MG tablet TAKE 1 TABLET BY MOUTH ONCE DAILY AFTER SUPPER   metoprolol tartrate (LOPRESSOR) 25 MG tablet Take 1 tablet (25 mg total) by mouth 2 (two) times daily.   omeprazole (PRILOSEC) 20 MG capsule Take 20 mg by mouth daily.    oxymetazoline (AFRIN) 0.05 % nasal spray Place 1 spray into both nostrils 2 (two) times daily as needed for congestion.   potassium chloride (KLOR-CON) 10 MEQ tablet Take 1 tablet (10 mEq total) by mouth daily.   pravastatin (PRAVACHOL) 40 MG tablet TAKE 1 TABLET BY MOUTH AT BEDTIME   tadalafil (CIALIS) 20 MG tablet Take 20 mg by mouth daily as needed for erectile dysfunction.   tamsulosin (FLOMAX) 0.4 MG CAPS capsule Take 1 capsule by mouth once daily   warfarin (COUMADIN) 5 MG tablet TAKE 1 TABLET BY MOUTH ONCE DAILY EXCEPT  1/2  TABLET  ON  WEDNESDAYS  OR  AS  DIRECTED   No facility-administered encounter medications on file as of 06/25/2021.    Past Medical History:  Diagnosis Date   Atrial fibrillation [facility])    Reportedly diagnosed November 2011   Essential hypertension    History of kidney stones    Prostate cancer ([place])  Radiation implants   Type 2 diabetes mellitus ([facility])     Past Surgical History:  Procedure Laterality Date   Gold seed placement     INGUINAL HERNIA REPAIR Left 07/02/2020   Procedure: LEFT INGUINAL HERNIORRHAPY WITH MESH;  Surgeon: Aviva Signs, MD;  Location:  [facility];  Service: General;  Laterality: Left;   LITHOTRIPSY     PROSTATE BIOPSY     TRIGGER FINGER RELEASE Left    ring finger   URETEROSCOPY WITH HOLMIUM LASER LITHOTRIPSY      Social History   Socioeconomic History   Marital status: Divorced    Spouse name: Not on file   Number of children: Not on file   Years of education: Not on file   Highest education level: Not on file  Occupational History   Not on file  Tobacco Use   Smoking status: Never   Smokeless tobacco: Never  Vaping Use   Vaping Use: Never used  Substance and Sexual Activity   Alcohol use: No    Alcohol/week: 0.0 standard drinks   Drug use: No   Sexual activity: Yes  Other Topics Concern   Not on file  Social History Narrative   Not on file   Social Determinants of Health   Financial Resource Strain: Not on file  Food Insecurity: Not on file  Transportation Needs: Not on file  Physical Activity: Not on file  Stress: Not on file  Social Connections: Not on file  Intimate Partner Violence: Not on file    Family History  Problem Relation Age of Onset   Cancer Father 52       Laryngeal   Lymphoma Mother 35       Objective: Vitals:   06/25/21 1459  BP: (!) 146/84  Pulse: 75     Physical Exam  Lab Results:  No results found for this or any previous visit (from the past 24 hour(s)).   BMET No results for input(s): NA, K, CL, CO2, GLUCOSE, BUN, CREATININE, CALCIUM in the last 72 hours. PSA  Lab Results  Component Value Date   PSA1 0.2 06/18/2021   PSA1 0.2 09/18/2020   PSA1 0.3 06/09/2020      Studies/Results: No results found.    Assessment & Plan:  History of prostate cancer.  PSA is back down.  Repeat in 6 months.   Testicular atrophy.  His T level was 132 with elevations of the  FSH/LH and prolactin.  I will reach out to Dr. Dorris Fetch for his advice about the need for pituitary imaging.    Renal stone.  He has slight interval growth but doesn't want intervention at this  time.  I will reassess with a KUB in 6 months. ..    No orders of the defined types were placed in this encounter.    Orders Placed This Encounter  Procedures   DG Abd 1 View    Standing Status:   Future    Standing Expiration Date:   06/25/2022    Order Specific Question:   Reason for Exam (SYMPTOM  OR DIAGNOSIS REQUIRED)    Answer:   renal stone    Order Specific Question:   Preferred imaging location?    Answer:   [facility]    Order Specific Question:   Radiology Contrast Protocol - do NOT remove file path    Answer:   [email or web address]   Urinalysis, Routine w reflex microscopic   PSA    Standing Status:  Future    Standing Expiration Date:   06/25/2022   Testosterone    Standing Status:   Future    Standing Expiration Date:   06/25/2022      Return in about 6 months (around 12/24/2021) for with KUB and labs. .   CC: [place], MD  Dr. Bud Face    Irine Seal 06/25/2021 Patient ID: Daniel Schaefer, male   DOB: 01-12-1951, 70 y.o.   MRN: [id number] Patient ID: TEMILOLUWA LAREDO, male   DOB: 1951-01-19, 70 y.o.   MRN: [id number]

## 2021-06-25 NOTE — Progress Notes (Signed)
Urological Symptom Review  Patient is experiencing the following symptoms: Get up at night to urinate Leakage of urine Weak stream Erection problems (male only)   Review of Systems  Gastrointestinal (upper)  : Negative for upper GI symptoms  Gastrointestinal (lower) : Negative for lower GI symptoms  Constitutional : Negative for symptoms  Skin: Negative for skin symptoms  Eyes: Negative for eye symptoms  Ear/Nose/Throat : Sinus problems  Hematologic/Lymphatic: Negative for Hematologic/Lymphatic symptoms  Cardiovascular : Negative for cardiovascular symptoms  Respiratory : Negative for respiratory symptoms  Endocrine: Negative for endocrine symptoms  Musculoskeletal: Joint pain  Neurological: Negative for neurological symptoms  Psychologic: Negative for psychiatric symptoms

## 2021-07-07 ENCOUNTER — Other Ambulatory Visit: Payer: Self-pay | Admitting: Nurse Practitioner

## 2021-07-09 DIAGNOSIS — Z794 Long term (current) use of insulin: Secondary | ICD-10-CM | POA: Diagnosis not present

## 2021-07-09 DIAGNOSIS — Z7984 Long term (current) use of oral hypoglycemic drugs: Secondary | ICD-10-CM | POA: Diagnosis not present

## 2021-07-09 DIAGNOSIS — H5053 Vertical heterophoria: Secondary | ICD-10-CM | POA: Diagnosis not present

## 2021-07-09 DIAGNOSIS — H52223 Regular astigmatism, bilateral: Secondary | ICD-10-CM | POA: Diagnosis not present

## 2021-07-09 DIAGNOSIS — H2513 Age-related nuclear cataract, bilateral: Secondary | ICD-10-CM | POA: Diagnosis not present

## 2021-07-09 DIAGNOSIS — E119 Type 2 diabetes mellitus without complications: Secondary | ICD-10-CM | POA: Diagnosis not present

## 2021-07-09 DIAGNOSIS — H5213 Myopia, bilateral: Secondary | ICD-10-CM | POA: Diagnosis not present

## 2021-07-09 DIAGNOSIS — H524 Presbyopia: Secondary | ICD-10-CM | POA: Diagnosis not present

## 2021-07-09 LAB — HM DIABETES EYE EXAM

## 2021-07-14 ENCOUNTER — Other Ambulatory Visit: Payer: Self-pay | Admitting: Urology

## 2021-07-14 DIAGNOSIS — N200 Calculus of kidney: Secondary | ICD-10-CM

## 2021-07-16 ENCOUNTER — Ambulatory Visit (INDEPENDENT_AMBULATORY_CARE_PROVIDER_SITE_OTHER): Payer: Medicare Other | Admitting: *Deleted

## 2021-07-16 DIAGNOSIS — Z23 Encounter for immunization: Secondary | ICD-10-CM | POA: Diagnosis not present

## 2021-07-16 DIAGNOSIS — I4821 Permanent atrial fibrillation: Secondary | ICD-10-CM

## 2021-07-16 DIAGNOSIS — Z5181 Encounter for therapeutic drug level monitoring: Secondary | ICD-10-CM

## 2021-07-16 DIAGNOSIS — I4891 Unspecified atrial fibrillation: Secondary | ICD-10-CM | POA: Diagnosis not present

## 2021-07-16 LAB — POCT INR: INR: 2.9 (ref 2.0–3.0)

## 2021-07-16 NOTE — Patient Instructions (Signed)
Continue warfarin 1 tablet daily except 1/2 tablet on Wednesdays.   Continue greens Recheck in 6 weeks  

## 2021-08-09 NOTE — Progress Notes (Signed)
Cardiology Office Note  Date: 08/10/2021   ID: Kip, Cropp 1951-04-13, MRN [id number]  PCP:  Coles Nation, MD  Cardiologist:  Rozann Lesches, MD Electrophysiologist:  None   Chief Complaint  Patient presents with   Cardiac follow-up     History of Present Illness: Daniel Schaefer is a 70 y.o. male last seen in May.  He is here for a routine visit.  He does not report any active sense of palpitations at this time on medical therapy.  He describes a few symptoms in the interim including lower back pain, transient episode of pleuritic chest discomfort that sounds inflammatory in etiology, also an episode of near syncope when he was bending over to pick up a quarter that had rolled under a cabinet.  He does not report any regular sense of dizziness or near syncope at baseline however.  He remains on Coumadin with follow-up in the anticoagulation clinic.  Recent INR was 2.9.  He does not describe any spontaneous bleeding problems.  I reviewed his medications which are noted below.  He continues to follow-up with Dr. Jimmye Norman.  Past Medical History:  Diagnosis Date   Atrial fibrillation [facility])    Reportedly diagnosed November 2011   Essential hypertension    History of kidney stones    Prostate cancer [facility])    Radiation implants   Type 2 diabetes mellitus ([place])     Past Surgical History:  Procedure Laterality Date   Gold seed placement     INGUINAL HERNIA REPAIR Left 07/02/2020   Procedure: LEFT INGUINAL HERNIORRHAPY WITH MESH;  Surgeon: Aviva Signs, MD;  Location: [facility];  Service: General;  Laterality: Left;   LITHOTRIPSY     PROSTATE BIOPSY     TRIGGER FINGER RELEASE Left    ring finger   URETEROSCOPY WITH HOLMIUM LASER LITHOTRIPSY      Current Outpatient Medications  Medication Sig Dispense Refill   amLODipine (NORVASC) 5 MG tablet Take 1 tablet by mouth once daily 90 tablet 3   APPLE CIDER VINEGAR PO Take by mouth.     ASTRAGALUS PO Take by  mouth.     Azelastine HCl 137 MCG/SPRAY SOLN Place 2 sprays into both nostrils daily.     Cholecalciferol (VITAMIN D3) 125 MCG (5000 UT) TABS Take 5,000 Units by mouth daily.      Flaxseed, Linseed, (FLAX SEEDS PO) Take by mouth.     furosemide (LASIX) 20 MG tablet Take 1 tablet by mouth once daily 90 tablet 3   GLOBAL EASE INJECT PEN NEEDLES 31G X 8 MM MISC USE 1 DAILY AS DIRECTED - RUN ON CASH $15 - PER PC. 100 each 5   glucose blood test strip Use as instructed 100 each 12   Insulin Glargine (BASAGLAR KWIKPEN) 100 UNIT/ML INJECT 64 UNITS INTO THE SKIN AT BEDTIME (Patient taking differently: Inject 64 Units into the skin at bedtime.) 10 pen 2   Insulin Pen Needle (B-D ULTRAFINE III SHORT PEN) 31G X 8 MM MISC 1 each by Does not apply route as directed. 50 each 2   Lancets MISC 1 each by Does not apply route as directed. 100 each 3   lisinopril (ZESTRIL) 20 MG tablet Take 1 tablet by mouth once daily 90 tablet 2   loratadine (CLARITIN) 10 MG tablet Take 10 mg by mouth daily.     melatonin 5 MG TABS Take 5 mg by mouth.     metFORMIN (GLUCOPHAGE) 500 MG tablet TAKE  1 TABLET BY MOUTH ONCE DAILY AFTER SUPPER 90 tablet 0   metoprolol tartrate (LOPRESSOR) 25 MG tablet Take 1 tablet (25 mg total) by mouth 2 (two) times daily. 180 tablet 1   omeprazole (PRILOSEC) 20 MG capsule Take 20 mg by mouth daily.      oxymetazoline (AFRIN) 0.05 % nasal spray Place 1 spray into both nostrils 2 (two) times daily as needed for congestion.     potassium chloride (KLOR-CON) 10 MEQ tablet Take 1 tablet (10 mEq total) by mouth daily. 90 tablet 3   pravastatin (PRAVACHOL) 40 MG tablet TAKE 1 TABLET BY MOUTH AT BEDTIME 90 tablet 3   tadalafil (CIALIS) 20 MG tablet Take 20 mg by mouth daily as needed for erectile dysfunction.     tamsulosin (FLOMAX) 0.4 MG CAPS capsule Take 1 capsule by mouth once daily 30 capsule 11   warfarin (COUMADIN) 5 MG tablet TAKE 1 TABLET BY MOUTH ONCE DAILY EXCEPT  1/2  TABLET  ON  WEDNESDAYS  OR   AS  DIRECTED 35 tablet 6   No current facility-administered medications for this visit.   Allergies:  Bactrim [sulfamethoxazole-trimethoprim], Other, and Tresiba flextouch [insulin degludec]   ROS: No orthopnea or PND.  Physical Exam: VS:  BP 134/80   Pulse 76   Ht 5\' 10"  (1.778 m)   Wt 233 lb (105.7 kg)   SpO2 97%   BMI 33.43 kg/m , BMI Body mass index is 33.43 kg/m.  Wt Readings from Last 3 Encounters:  08/10/21 233 lb (105.7 kg)  06/25/21 235 lb 6.4 oz (106.8 kg)  04/02/21 237 lb (107.5 kg)    General: Patient appears comfortable at rest. HEENT: Conjunctiva and lids normal, wearing a mask. Neck: Supple, no elevated JVP or carotid bruits, no thyromegaly. Lungs: Clear to auscultation, nonlabored breathing at rest. Cardiac: Irregularly irregular, no S3 or significant systolic murmur, no pericardial rub. Extremities: No pitting edema.  ECG:  An ECG dated 02/04/2021 was personally reviewed today and demonstrated:  Atrial fibrillation with R' in lead V1 and V2.  Recent Labwork: 02/26/2021: ALT 14; AST 20; BUN 18; Creatinine 1.4; Potassium 4.3; Sodium 141     Component Value Date/Time   CHOL 159 02/26/2021 0000   TRIG 259 (A) 02/26/2021 0000   HDL 38 02/26/2021 0000   LDLCALC 78 02/26/2021 0000  June 2022: Hemoglobin 13.1, platelets 242, TSH 1.61, hemoglobin A1c 8.5%  Other Studies Reviewed Today:  No interval cardiac testing for review.  Assessment and Plan:  1.  Permanent atrial fibrillation with CHA2DS2-VASc score of 3.  Heart rate control is adequate on Lopressor and he remains on Coumadin for stroke prophylaxis with recent therapeutic INR.  No changes were made today.  2.  Essential hypertension, systolic is in the 161W today.  He continues on both Norvasc and lisinopril with follow-up with Dr. Jimmye Norman.  Medication Adjustments/Labs and Tests Ordered: Current medicines are reviewed at length with the patient today.  Concerns regarding medicines are outlined above.    Tests Ordered: No orders of the defined types were placed in this encounter.   Medication Changes: No orders of the defined types were placed in this encounter.   Disposition:  Follow up  6 months.  Signed, Satira Sark, MD, [facility] 08/10/2021 10:32 AM    [facility], [place], [place] [address] Phone: [phone]; Fax: [phone]

## 2021-08-10 ENCOUNTER — Encounter: Payer: Self-pay | Admitting: Cardiology

## 2021-08-10 ENCOUNTER — Ambulatory Visit: Payer: Medicare Other | Admitting: Cardiology

## 2021-08-10 VITALS — BP 134/80 | HR 76 | Ht 70.0 in | Wt 233.0 lb

## 2021-08-10 DIAGNOSIS — I4821 Permanent atrial fibrillation: Secondary | ICD-10-CM | POA: Diagnosis not present

## 2021-08-10 DIAGNOSIS — I10 Essential (primary) hypertension: Secondary | ICD-10-CM

## 2021-08-10 NOTE — Patient Instructions (Signed)

## 2021-08-27 ENCOUNTER — Ambulatory Visit: Payer: Medicare Other | Admitting: *Deleted

## 2021-08-27 DIAGNOSIS — Z5181 Encounter for therapeutic drug level monitoring: Secondary | ICD-10-CM

## 2021-08-27 DIAGNOSIS — I4891 Unspecified atrial fibrillation: Secondary | ICD-10-CM | POA: Diagnosis not present

## 2021-08-27 DIAGNOSIS — I4821 Permanent atrial fibrillation: Secondary | ICD-10-CM | POA: Diagnosis not present

## 2021-08-27 LAB — POCT INR: INR: 2.9 (ref 2.0–3.0)

## 2021-08-27 NOTE — Patient Instructions (Signed)
Continue warfarin 1 tablet daily except 1/2 tablet on Wednesdays.   Continue greens Recheck in 6 weeks  

## 2021-09-15 ENCOUNTER — Other Ambulatory Visit: Payer: Self-pay | Admitting: Cardiology

## 2021-09-15 ENCOUNTER — Other Ambulatory Visit: Payer: Self-pay | Admitting: Nurse Practitioner

## 2021-09-17 ENCOUNTER — Other Ambulatory Visit: Payer: Self-pay

## 2021-09-17 ENCOUNTER — Other Ambulatory Visit: Payer: Medicare Other

## 2021-09-17 DIAGNOSIS — Z8546 Personal history of malignant neoplasm of prostate: Secondary | ICD-10-CM

## 2021-09-17 DIAGNOSIS — E291 Testicular hypofunction: Secondary | ICD-10-CM

## 2021-09-18 LAB — TESTOSTERONE: Testosterone: 201 ng/dL — ABNORMAL LOW (ref 264–916)

## 2021-09-18 LAB — PSA: Prostate Specific Ag, Serum: 0.2 ng/mL (ref 0.0–4.0)

## 2021-09-24 ENCOUNTER — Ambulatory Visit: Payer: Medicare Other | Admitting: Urology

## 2021-09-24 ENCOUNTER — Encounter: Payer: Self-pay | Admitting: Urology

## 2021-09-24 ENCOUNTER — Other Ambulatory Visit: Payer: Self-pay

## 2021-09-24 ENCOUNTER — Ambulatory Visit (HOSPITAL_COMMUNITY)
Admission: RE | Admit: 2021-09-24 | Discharge: 2021-09-24 | Disposition: A | Discharge status: Home / Self Care | Payer: Medicare Other | Source: Ambulatory Visit | Attending: Urology | Admitting: Urology

## 2021-09-24 VITALS — BP 129/83 | HR 62 | Wt 232.0 lb

## 2021-09-24 DIAGNOSIS — Z8546 Personal history of malignant neoplasm of prostate: Secondary | ICD-10-CM

## 2021-09-24 DIAGNOSIS — R3912 Poor urinary stream: Secondary | ICD-10-CM | POA: Diagnosis not present

## 2021-09-24 DIAGNOSIS — E291 Testicular hypofunction: Secondary | ICD-10-CM | POA: Diagnosis not present

## 2021-09-24 DIAGNOSIS — N200 Calculus of kidney: Secondary | ICD-10-CM | POA: Diagnosis not present

## 2021-09-24 MED ORDER — TAMSULOSIN HCL 0.4 MG PO CAPS: 0.4000 mg | ORAL_CAPSULE | Freq: Every day | ORAL | 3 refills | Status: DC

## 2021-09-24 NOTE — Progress Notes (Signed)
Subjective:  1. Weak urinary stream   2. History of adenocarcinoma of prostate   3. Hypogonadism in male   4. Renal stones   5. Renal calculus     Daniel Schaefer returns today in f/u.  His PSA  remains 0.2.   He has a history of prostate cancer treated with EXRT and seeds in 2003.  He has a history of ED and used tadalafil with a partial response but he isn't on it currently.  His testosterone level is up slightly at 201.   His [place] and LH from 06/18/21 were elevated at 41 and 24 and his prolactin is 28.8.  He has no gynecomastia, headache or significant changes in eye sight or sense of smell.  He still has had some fatigue.  He is voiding well with an IPSS of 9.  He remains on tamsulosin.  He has a reduced stream  and nocturia x 2.  His IPSS is 7-8 with a weak stream and nocturia 1-2x.    He has a LLP stone that has been enlarging and was 59mm in 7/20.  It was 10.80mm in 1/22 and is now 10.67mm in 4/22 and it is was 58mm in 10/22.  He didn't get the films I ordered for this visit.  He has had no flank pain or hematuria.     IPSS     Row Name 09/24/21 1400         International Prostate Symptom Score   How often have you had the sensation of not emptying your bladder? Not at All     How often have you had to urinate less than every two hours? Less than 1 in 5 times     How often have you found you stopped and started again several times when you urinated? Not at All     How often have you found it difficult to postpone urination? Less than 1 in 5 times     How often have you had a weak urinary stream? Almost always     How often have you had to strain to start urination? Not at All     How many times did you typically get up at night to urinate? 2 Times     Total IPSS Score 9       Quality of Life due to urinary symptoms   If you were to spend the rest of your life with your urinary condition just the way it is now how would you feel about that? Mostly Satisfied               ROS:  ROS:   A complete review of systems was performed.  All systems are negative except for pertinent findings as noted.   ROS  Allergies  Allergen Reactions   Bactrim [Sulfamethoxazole-Trimethoprim] Other (See Comments)    Thin his blood really bad   Other     Cannot mix warfarin and bactrim    Tresiba Flextouch [Insulin Degludec] Rash    Outpatient Encounter Medications as of 09/24/2021  Medication Sig   amLODipine (NORVASC) 5 MG tablet Take 1 tablet by mouth once daily   APPLE CIDER VINEGAR PO Take by mouth.   ASTRAGALUS PO Take by mouth.   Azelastine HCl 137 MCG/SPRAY SOLN Place 2 sprays into both nostrils daily.   Cholecalciferol (VITAMIN D3) 125 MCG (5000 UT) TABS Take 5,000 Units by mouth daily.    Flaxseed, Linseed, (FLAX SEEDS PO) Take by mouth.   furosemide (LASIX)  20 MG tablet Take 1 tablet by mouth once daily   GLOBAL EASE INJECT PEN NEEDLES 31G X 8 MM MISC USE 1 DAILY AS DIRECTED - RUN ON CASH $15 - PER PC.   glucose blood test strip Use as instructed   Insulin Glargine (BASAGLAR KWIKPEN) 100 UNIT/ML INJECT 64 UNITS INTO THE SKIN AT BEDTIME (Patient taking differently: Inject 64 Units into the skin at bedtime.)   Insulin Pen Needle (B-D ULTRAFINE III SHORT PEN) 31G X 8 MM MISC 1 each by Does not apply route as directed.   Lancets MISC 1 each by Does not apply route as directed.   lisinopril (ZESTRIL) 20 MG tablet Take 1 tablet by mouth once daily   loratadine (CLARITIN) 10 MG tablet Take 10 mg by mouth daily.   melatonin 5 MG TABS Take 5 mg by mouth.   metFORMIN (GLUCOPHAGE) 500 MG tablet TAKE 1 TABLET BY MOUTH ONCE DAILY AFTER SUPPER   metoprolol tartrate (LOPRESSOR) 25 MG tablet Take 1 tablet (25 mg total) by mouth 2 (two) times daily.   omeprazole (PRILOSEC) 20 MG capsule Take 20 mg by mouth daily.    oxymetazoline (AFRIN) 0.05 % nasal spray Place 1 spray into both nostrils 2 (two) times daily as needed for congestion.   potassium chloride (KLOR-CON) 10 MEQ tablet Take 1  tablet (10 mEq total) by mouth daily.   pravastatin (PRAVACHOL) 40 MG tablet TAKE 1 TABLET BY MOUTH AT BEDTIME   tadalafil (CIALIS) 20 MG tablet Take 20 mg by mouth daily as needed for erectile dysfunction.   warfarin (COUMADIN) 5 MG tablet TAKE 1 TABLET BY MOUTH ONCE DAILY EXCEPT  1/2  TABLET  ON  WEDNESDAYS  OR  AS  DIRECTED   [DISCONTINUED] tamsulosin (FLOMAX) 0.4 MG CAPS capsule Take 1 capsule by mouth once daily   tamsulosin (FLOMAX) 0.4 MG CAPS capsule Take 1 capsule (0.4 mg total) by mouth daily.   No facility-administered encounter medications on file as of 09/24/2021.    Past Medical History:  Diagnosis Date   Atrial fibrillation [facility])    Reportedly diagnosed November 2011   Essential hypertension    History of kidney stones    Prostate cancer [facility])    Radiation implants   Type 2 diabetes mellitus ([place])     Past Surgical History:  Procedure Laterality Date   Gold seed placement     INGUINAL HERNIA REPAIR Left 07/02/2020   Procedure: LEFT INGUINAL HERNIORRHAPY WITH MESH;  Surgeon: Aviva Signs, MD;  Location: [facility];  Service: General;  Laterality: Left;   LITHOTRIPSY     PROSTATE BIOPSY     TRIGGER FINGER RELEASE Left    ring finger   URETEROSCOPY WITH HOLMIUM LASER LITHOTRIPSY      Social History   Socioeconomic History   Marital status: Divorced    Spouse name: Not on file   Number of children: Not on file   Years of education: Not on file   Highest education level: Not on file  Occupational History   Not on file  Tobacco Use   Smoking status: Never   Smokeless tobacco: Never  Vaping Use   Vaping Use: Never used  Substance and Sexual Activity   Alcohol use: No    Alcohol/week: 0.0 standard drinks   Drug use: No   Sexual activity: Yes  Other Topics Concern   Not on file  Social History Narrative   Not on file   Social Determinants of Health   Financial  Resource Strain: Not on file  Food Insecurity: Not on file  Transportation Needs: Not on file   Physical Activity: Not on file  Stress: Not on file  Social Connections: Not on file  Intimate Partner Violence: Not on file    Family History  Problem Relation Age of Onset   Cancer Father 21       Laryngeal   Lymphoma Mother 83       Objective: Vitals:   09/24/21 1431  BP: 129/83  Pulse: 62     Physical Exam  Lab Results:  No results found for this or any previous visit (from the past 24 hour(s)).   BMET No results for input(s): NA, K, CL, CO2, GLUCOSE, BUN, CREATININE, CALCIUM in the last 72 hours. PSA  Lab Results  Component Value Date   PSA1 0.2 09/17/2021   PSA1 0.2 06/18/2021   PSA1 0.2 09/18/2020      Studies/Results: DG Abd 1 View  Result Date: 09/25/2021 CLINICAL DATA:  Renal stone. EXAM: ABDOMEN - 1 VIEW COMPARISON:  Abdominal radiograph dated 06/18/2021. FINDINGS: Two adjacent radiopaque calculi projecting over the inferior pole of the left kidney with the larger measuring 11 mm. Indeterminate 13 mm radiopaque density to the right of L1. There is moderate stool throughout the colon. No bowel dilatation. Degenerative changes of the spine. No acute osseous pathology. Prostate brachytherapy seeds. IMPRESSION: Two adjacent radiopaque calculi projecting over the inferior pole of the left kidney. Electronically Signed   By: Anner Crete M.D.   On: 09/25/2021 01:43      Assessment & Plan:  History of prostate cancer.  PSA is back down.  Repeat in 6 months.   Testicular atrophy.  His T level 201 with elevations of the  FSH/LH and prolactin.    Renal stone.  He has stable 76mm LLP stone or stone cluster.  I will reassess with a KUB in 6 months. ..    Meds ordered this encounter  Medications   tamsulosin (FLOMAX) 0.4 MG CAPS capsule    Sig: Take 1 capsule (0.4 mg total) by mouth daily.    Dispense:  90 capsule    Refill:  3      Orders Placed This Encounter  Procedures   Abdomen 1 view (KUB)    Order Specific Question:   Reason for Exam  (SYMPTOM  OR DIAGNOSIS REQUIRED)    Answer:   renal stone    Order Specific Question:   Preferred imaging location?    Answer:   [facility]   Abdomen 1 view (KUB)    Standing Status:   Future    Standing Expiration Date:   09/24/2022    Order Specific Question:   Reason for Exam (SYMPTOM  OR DIAGNOSIS REQUIRED)    Answer:   renal stone    Order Specific Question:   Preferred imaging location?    Answer:   [facility]   Urinalysis, Routine w reflex microscopic   PSA    Standing Status:   Future    Standing Expiration Date:   09/24/2022   Testosterone    Standing Status:   Future    Standing Expiration Date:   09/24/2022      Return in about 6 months (around 03/24/2022) for KUB today and prior to f/u.  Labs prior to f/u. Marland Kitchen   CC: Hanston Nation, MD  Dr. Bud Face    Irine Seal 09/25/2021 Patient ID: Daniel Schaefer, male  DOB: 07-18-1951, 71 y.o.   MRN: [id number] Patient ID: Daniel Schaefer, male   DOB: Jan 21, 1951, 71 y.o.   MRN: [id number] Patient ID: Daniel Schaefer, male   DOB: 01-Aug-1951, 71 y.o.   MRN: [id number]

## 2021-09-24 NOTE — Progress Notes (Signed)
Urological Symptom Review  Patient is experiencing the following symptoms: Get up at night to urinate Leakage of urine   Review of Systems  Gastrointestinal (upper)  : Negative for upper GI symptoms  Gastrointestinal (lower) : Negative for lower GI symptoms  Constitutional : Negative for symptoms  Skin: Negative for skin symptoms  Eyes: Negative for eye symptoms  Ear/Nose/Throat : Negative for Ear/Nose/Throat symptoms  Hematologic/Lymphatic: Negative for Hematologic/Lymphatic symptoms  Cardiovascular : Negative for cardiovascular symptoms  Respiratory : Negative for respiratory symptoms  Endocrine: Negative for endocrine symptoms  Musculoskeletal: Negative for musculoskeletal symptoms  Neurological: Negative for neurological symptoms  Psychologic: Negative for psychiatric symptoms

## 2021-10-05 ENCOUNTER — Other Ambulatory Visit: Payer: Self-pay

## 2021-10-05 ENCOUNTER — Ambulatory Visit: Payer: Medicare Other | Admitting: "Endocrinology

## 2021-10-05 ENCOUNTER — Encounter: Payer: Self-pay | Admitting: "Endocrinology

## 2021-10-05 VITALS — BP 118/76 | HR 60 | Ht 70.0 in | Wt 235.4 lb

## 2021-10-05 DIAGNOSIS — E1159 Type 2 diabetes mellitus with other circulatory complications: Secondary | ICD-10-CM

## 2021-10-05 DIAGNOSIS — I10 Essential (primary) hypertension: Secondary | ICD-10-CM

## 2021-10-05 DIAGNOSIS — E782 Mixed hyperlipidemia: Secondary | ICD-10-CM

## 2021-10-05 DIAGNOSIS — Z794 Long term (current) use of insulin: Secondary | ICD-10-CM

## 2021-10-05 LAB — POCT GLYCOSYLATED HEMOGLOBIN (HGB A1C): HbA1c, POC (controlled diabetic range): 8 % — AB (ref 0.0–7.0)

## 2021-10-05 MED ORDER — ACCU-CHEK GUIDE VI STRP: ORAL_STRIP | 2 refills | Status: DC

## 2021-10-05 MED ORDER — ACCU-CHEK GUIDE ME W/DEVICE KIT: 1.0000 | PACK | 0 refills | Status: AC

## 2021-10-05 MED ORDER — BASAGLAR KWIKPEN 100 UNIT/ML ~~LOC~~ SOPN: PEN_INJECTOR | SUBCUTANEOUS | 1 refills | Status: DC

## 2021-10-05 NOTE — Patient Instructions (Signed)

## 2021-10-05 NOTE — Progress Notes (Signed)
10/05/2021  Endocrinology follow-up note   Subjective:    Patient ID: Daniel Schaefer, male    DOB: [birth year],    Past Medical History:  Diagnosis Date   Atrial fibrillation [facility])    Reportedly diagnosed November 2011   Essential hypertension    History of kidney stones    Prostate cancer [facility])    Radiation implants   Type 2 diabetes mellitus ([place])    Past Surgical History:  Procedure Laterality Date   Gold seed placement     INGUINAL HERNIA REPAIR Left 07/02/2020   Procedure: LEFT INGUINAL HERNIORRHAPY WITH MESH;  Surgeon: Aviva Signs, MD;  Location: [facility];  Service: General;  Laterality: Left;   LITHOTRIPSY     PROSTATE BIOPSY     TRIGGER FINGER RELEASE Left    ring finger   URETEROSCOPY WITH HOLMIUM LASER LITHOTRIPSY     Social History   Socioeconomic History   Marital status: Divorced    Spouse name: Not on file   Number of children: Not on file   Years of education: Not on file   Highest education level: Not on file  Occupational History   Not on file  Tobacco Use   Smoking status: Never   Smokeless tobacco: Never  Vaping Use   Vaping Use: Never used  Substance and Sexual Activity   Alcohol use: No    Alcohol/week: 0.0 standard drinks   Drug use: No   Sexual activity: Yes  Other Topics Concern   Not on file  Social History Narrative   Not on file   Social Determinants of Health   Financial Resource Strain: Not on file  Food Insecurity: Not on file  Transportation Needs: Not on file  Physical Activity: Not on file  Stress: Not on file  Social Connections: Not on file   Outpatient Encounter Medications as of 10/05/2021  Medication Sig   Blood Glucose Monitoring Suppl (ACCU-CHEK GUIDE ME) w/Device KIT 1 Piece by Does not apply route as directed.   glucose blood (ACCU-CHEK GUIDE) test strip Use to test glucose 3 times a day   amLODipine (NORVASC) 5 MG tablet Take 1 tablet by mouth once daily   APPLE CIDER VINEGAR PO Take by mouth.    ASTRAGALUS PO Take by mouth.   Azelastine HCl 137 MCG/SPRAY SOLN Place 2 sprays into both nostrils daily.   Cholecalciferol (VITAMIN D3) 125 MCG (5000 UT) TABS Take 5,000 Units by mouth daily.    Flaxseed, Linseed, (FLAX SEEDS PO) Take by mouth.   furosemide (LASIX) 20 MG tablet Take 1 tablet by mouth once daily   GLOBAL EASE INJECT PEN NEEDLES 31G X 8 MM MISC USE 1 DAILY AS DIRECTED - RUN ON CASH $15 - PER PC.   Insulin Glargine (BASAGLAR KWIKPEN) 100 UNIT/ML INJECT 70 UNITS INTO THE SKIN AT BEDTIME   Insulin Pen Needle (B-D ULTRAFINE III SHORT PEN) 31G X 8 MM MISC 1 each by Does not apply route as directed.   Lancets MISC 1 each by Does not apply route as directed.   lisinopril (ZESTRIL) 20 MG tablet Take 1 tablet by mouth once daily   loratadine (CLARITIN) 10 MG tablet Take 10 mg by mouth daily.   melatonin 5 MG TABS Take 5 mg by mouth.   metFORMIN (GLUCOPHAGE) 500 MG tablet TAKE 1 TABLET BY MOUTH ONCE DAILY AFTER SUPPER   metoprolol tartrate (LOPRESSOR) 25 MG tablet Take 1 tablet (25 mg total) by mouth 2 (two) times daily.  omeprazole (PRILOSEC) 20 MG capsule Take 20 mg by mouth daily.    oxymetazoline (AFRIN) 0.05 % nasal spray Place 1 spray into both nostrils 2 (two) times daily as needed for congestion.   potassium chloride (KLOR-CON) 10 MEQ tablet Take 1 tablet (10 mEq total) by mouth daily.   pravastatin (PRAVACHOL) 40 MG tablet TAKE 1 TABLET BY MOUTH AT BEDTIME   tadalafil (CIALIS) 20 MG tablet Take 20 mg by mouth daily as needed for erectile dysfunction.   tamsulosin (FLOMAX) 0.4 MG CAPS capsule Take 1 capsule (0.4 mg total) by mouth daily.   warfarin (COUMADIN) 5 MG tablet TAKE 1 TABLET BY MOUTH ONCE DAILY EXCEPT  1/2  TABLET  ON  WEDNESDAYS  OR  AS  DIRECTED   [DISCONTINUED] glucose blood test strip Use as instructed   [DISCONTINUED] Insulin Glargine (BASAGLAR KWIKPEN) 100 UNIT/ML INJECT 64 UNITS INTO THE SKIN AT BEDTIME (Patient taking differently: Inject 64 Units into the skin  at bedtime.)   No facility-administered encounter medications on file as of 10/05/2021.   ALLERGIES: Allergies  Allergen Reactions   Bactrim [Sulfamethoxazole-Trimethoprim] Other (See Comments)    Thin his blood really bad   Other     Cannot mix warfarin and bactrim    Tresiba Flextouch [Insulin Degludec] Rash   VACCINATION STATUS:  There is no immunization history on file for this patient.  Diabetes He presents for his follow-up diabetic visit. He has type 2 diabetes mellitus. Onset time: He was diagnosed at approximate age of 19 years. His disease course has been worsening. There are no hypoglycemic associated symptoms. There are no diabetic associated symptoms. There are no hypoglycemic complications. Symptoms are worsening. Diabetic complications include nephropathy. Risk factors for coronary artery disease include hypertension, dyslipidemia, sedentary lifestyle, male sex and diabetes mellitus. Current diabetic treatment includes oral agent (monotherapy) and insulin injections. He is compliant with treatment most of the time. His weight is increasing steadily. He is following a generally unhealthy diet. When asked about meal planning, he reported none. He has not had a previous visit with a dietitian. He rarely participates in exercise. His home blood glucose trend is increasing steadily. His breakfast blood glucose range is generally 140-180 mg/dl. His bedtime blood glucose range is generally 180-200 mg/dl. His overall blood glucose range is 180-200 mg/dl. (He presents today with his meter and logs showing above target glycemic profile with point-of-care A1c of 8%.  No hypoglycemia.   He reports he has not be eating as well or exercising as he should lately due to the holidays.  ) An ACE inhibitor/angiotensin II receptor blocker is being taken. He does not see a podiatrist.Eye exam is current.  Hypertension This is a chronic problem. The current episode started more than 1 year ago. The  problem is unchanged. The problem is controlled. There are no associated agents to hypertension. Risk factors for coronary artery disease include diabetes mellitus, dyslipidemia, male gender and sedentary lifestyle. Past treatments include ACE inhibitors, calcium channel blockers, diuretics and beta blockers. The current treatment provides moderate improvement. There are no compliance problems.  Hypertensive end-organ damage includes kidney disease. Identifiable causes of hypertension include chronic renal disease.  Hyperlipidemia This is a chronic problem. The current episode started more than 1 year ago. The problem is controlled. Recent lipid tests were reviewed and are normal. Exacerbating diseases include chronic renal disease and diabetes. Factors aggravating his hyperlipidemia include beta blockers. Current antihyperlipidemic treatment includes statins. The current treatment provides moderate improvement of lipids. There  are no compliance problems.  Risk factors for coronary artery disease include dyslipidemia, diabetes mellitus, hypertension, male sex and a sedentary lifestyle.   Review of systems  Constitutional: + Minimally fluctuating body weight,  current Body mass index is 33.78 kg/m. , no fatigue, no subjective hyperthermia, no subjective hypothermia    Objective:    BP 118/76    Pulse 60    Ht _0  (1.778 m)    Wt 235 lb 6.4 oz (106.8 kg)    BMI 33.78 kg/m   Wt Readings from Last 3 Encounters:  10/05/21 235 lb 6.4 oz (106.8 kg)  09/24/21 232 lb (105.2 kg)  08/10/21 233 lb (105.7 kg)    BP Readings from Last 3 Encounters:  10/05/21 118/76  09/24/21 129/83  08/10/21 134/80     Physical Exam- Limited  Constitutional:  Body mass index is 33.78 kg/m. , not in acute distress, normal state of mind Eyes:  EOMI, no exophthalmos     Results for orders placed or performed in visit on 10/05/21  HgB A1c  Result Value Ref Range   Hemoglobin A1C     HbA1c POC (<> result,  manual entry)     HbA1c, POC (prediabetic range)     HbA1c, POC (controlled diabetic range) 8.0 (A) 0.0 - 7.0 %   Complete Blood Count (Most recent): Lab Results  Component Value Date   WBC 6.6 08/31/2013   HGB 14.0 08/31/2013   HCT 40.1 08/31/2013   MCV 75.9 (L) 08/31/2013   PLT 235 08/31/2013   Chemistry (most recent): Lab Results  Component Value Date   NA 141 02/26/2021   K 4.3 02/26/2021   CL 102 02/26/2021   CO2 23 (A) 02/26/2021   BUN 18 02/26/2021   CREATININE 1.4 (A) 02/26/2021   Lipid Panel     Component Value Date/Time   CHOL 159 02/26/2021 0000   TRIG 259 (A) 02/26/2021 0000   HDL 38 02/26/2021 0000   LDLCALC 78 02/26/2021 0000    Assessment & Plan:   1) Type 2 diabetes mellitus with stage 3 renal insufficiency, with long-term current use of insulin ([place])  He presents today with his meter and logs showing above target glycemic profile with point-of-care A1c of 8%.  No hypoglycemia.   He reports he has not be eating as well or exercising as he should lately due to the holidays.    -Patient remains at a high risk for more acute and chronic complications of diabetes which include CAD, CVA, CKD, retinopathy, and neuropathy. These are all discussed in detail with the patient.  -Recent labs reviewed showing improved renal function.  - Nutritional counseling repeated at each appointment due to patients tendency to fall back in to old habits.  - he acknowledges that there is a room for improvement in his food and drink choices. - Suggestion is made for him to avoid simple carbohydrates  from his diet including Cakes, Sweet Desserts, Ice Cream, Soda (diet and regular), Sweet Tea, Candies, Chips, Cookies, Store Bought Juices, Alcohol , Artificial Sweeteners,  Coffee Creamer, and "Sugar-free" Products, Lemonade. This will help patient to have more stable blood glucose profile and potentially avoid unintended weight gain.  The following Lifestyle Medicine  recommendations according to [place]  [facility]) were discussed and and offered to patient and he  agrees to start the journey:  A. Whole Foods, Plant-Based Nutrition comprising of fruits and vegetables, plant-based proteins, whole-grain carbohydrates was discussed in detail  with the patient.   A list for source of those nutrients were also provided to the patient.  Patient will use only water or unsweetened tea for hydration. B.  The need to stay away from risky substances including alcohol, smoking; obtaining 7 to 9 hours of restorative sleep, at least 150 minutes of moderate intensity exercise weekly, the importance of healthy social    - Patient is advised to stick to a routine mealtimes to eat 3 meals a day and avoid unnecessary snacks (to snack only to correct hypoglycemia).  -He is following with Jearld Fenton, CDE for diabetes education.  - Based on his glycemic profile showing above target, he is advised to increase his basal insulin.  He is advised to increase Basaglar to 70 units nightly along with metformin 500 mg p.o. twice daily.  He worries about cost of medications, previously declined offer for Trulicity/Ozempic weekly injections.   He has sulfa allergy, not a candidate for glipizide.   -He is encouraged to continue monitoring blood glucose twice daily, before breakfast and before bed, and to call the clinic if he has readings less than 70 or greater than 200 for 3 tests in a row.  - He is generally hesitant to add medications.  - Patient specific target  for A1c; LDL, HDL, Triglycerides, and  Waist Circumference were discussed in detail.  2) BP/HTN: -His blood pressure is controlled to target.  He is advised to continue Norvasc 5 mg po daily, Lasix 20 mg po daily, Lisinopril 20 mg po daily, and Metoprolol 25 mg po twice daily.  3) Lipids/HPL:  His most recent lipid panel from 03/24/20 shows controlled LDL at 81.  He is advised to continue  pravastatin 40 mg p.o. daily at bedtime.  Side effects and precautions discussed with him.  He recently had his labs drawn at his PCP, will request copy.  4)  Weight/Diet:  His Body mass index is 33.78 kg/m.-candidate for some weight loss.  He is following with Jearld Fenton, CD for diabetes education.  No success in weight loss,  exercise, and carbohydrates information provided.  5) Chronic Care/Health Maintenance: -Patient on ACEI and Statin medications and encouraged to continue to follow up with Ophthalmology, Podiatrist at least yearly or according to recommendations, and advised to stay away from smoking. I have recommended yearly flu vaccine and pneumonia vaccination at least every 5 years; moderate intensity exercise for up to 150 minutes weekly; and  sleep for at least 7 hours a day.   He is advised to maintain close follow-up with his PMD Dr. Huel Cote.   I spent 40 minutes in the care of the patient today including review of labs from [facility], Lipids, Thyroid Function, Hematology (current and previous including abstractions from other facilities); face-to-face time discussing  his blood glucose readings/logs, discussing hypoglycemia and hyperglycemia episodes and symptoms, medications doses, his options of short and long term treatment based on the latest standards of care / guidelines;  discussion about incorporating lifestyle medicine;  and documenting the encounter.    Please refer to Patient Instructions for Blood Glucose Monitoring and Insulin/Medications Dosing Guide"  in media tab for additional information. Please  also refer to " Patient Self Inventory" in the Media  tab for reviewed elements of pertinent patient history.  Daniel Schaefer participated in the discussions, expressed understanding, and voiced agreement with the above plans.  All questions were answered to his satisfaction. he is encouraged to contact clinic should he have any questions or  concerns prior to his return  visit.    Follow up plan: Return in about 6 months (around 04/04/2022) for F/U with Pre-visit Labs, Meter, Logs, A1c here.Rayetta Pigg, FNP-BC [facility] [address] [place], [place] [address] Phone: [phone] Fax: [phone]  10/05/2021, 6:18 PM

## 2021-10-08 ENCOUNTER — Ambulatory Visit: Payer: Medicare Other | Admitting: *Deleted

## 2021-10-08 DIAGNOSIS — I4891 Unspecified atrial fibrillation: Secondary | ICD-10-CM | POA: Diagnosis not present

## 2021-10-08 DIAGNOSIS — I4821 Permanent atrial fibrillation: Secondary | ICD-10-CM | POA: Diagnosis not present

## 2021-10-08 DIAGNOSIS — Z5181 Encounter for therapeutic drug level monitoring: Secondary | ICD-10-CM | POA: Diagnosis not present

## 2021-10-08 LAB — POCT INR: INR: 2.5 (ref 2.0–3.0)

## 2021-10-08 NOTE — Patient Instructions (Signed)
Continue warfarin 1 tablet daily except 1/2 tablet on Wednesdays.   Continue greens Recheck in 6 weeks  

## 2021-10-15 DIAGNOSIS — E1169 Type 2 diabetes mellitus with other specified complication: Secondary | ICD-10-CM | POA: Diagnosis not present

## 2021-10-15 DIAGNOSIS — I4891 Unspecified atrial fibrillation: Secondary | ICD-10-CM | POA: Diagnosis not present

## 2021-10-15 DIAGNOSIS — I10 Essential (primary) hypertension: Secondary | ICD-10-CM | POA: Diagnosis not present

## 2021-10-15 DIAGNOSIS — I509 Heart failure, unspecified: Secondary | ICD-10-CM | POA: Diagnosis not present

## 2021-11-02 DIAGNOSIS — J329 Chronic sinusitis, unspecified: Secondary | ICD-10-CM | POA: Diagnosis not present

## 2021-11-02 DIAGNOSIS — J40 Bronchitis, not specified as acute or chronic: Secondary | ICD-10-CM | POA: Diagnosis not present

## 2021-11-19 ENCOUNTER — Other Ambulatory Visit: Payer: Self-pay

## 2021-11-19 ENCOUNTER — Ambulatory Visit (INDEPENDENT_AMBULATORY_CARE_PROVIDER_SITE_OTHER): Payer: Medicare Other | Admitting: *Deleted

## 2021-11-19 DIAGNOSIS — I4821 Permanent atrial fibrillation: Secondary | ICD-10-CM | POA: Diagnosis not present

## 2021-11-19 DIAGNOSIS — Z5181 Encounter for therapeutic drug level monitoring: Secondary | ICD-10-CM

## 2021-11-19 LAB — POCT INR: INR: 3 (ref 2.0–3.0)

## 2021-11-19 NOTE — Patient Instructions (Signed)
Continue warfarin 1 tablet daily except 1/2 tablet on Wednesdays.   Continue greens Recheck in 6 weeks  

## 2021-12-01 ENCOUNTER — Other Ambulatory Visit: Payer: Self-pay | Admitting: Nurse Practitioner

## 2021-12-01 NOTE — Telephone Encounter (Signed)
Last OV 10/05/2021 ? ?Just wanted to clarify that he is taking Metformin 500 mg BID? Per last visit. ? ?Previous visits show he is taking once daily. ? ?Please advise. ?

## 2021-12-03 ENCOUNTER — Telehealth: Payer: Self-pay | Admitting: Cardiology

## 2021-12-03 NOTE — Telephone Encounter (Signed)
Pt c/o medication issue: ? ?1. Name of Medication: Ibuprofen 600  ? ?2. How are you currently taking this medication (dosage and times per day)? Not currently taking  ? ?3. Are you having a reaction (difficulty breathing--STAT)? No  ? ?4. What is your medication issue? Pharmacy is calling stating this patient was prescribed ibuprofen by his dentist for an emergent root canal he had. They are wanting confirmation if the patient can take this. Pharmacist suggest the patient be prescribed tylenol as an alternative instead.   ?

## 2021-12-04 NOTE — Telephone Encounter (Signed)
Patient notified and verbalized understanding. 

## 2021-12-04 NOTE — Telephone Encounter (Signed)
Would recommend patient take tylenol instead. IBU will increase his risk of bleeding. ?Could take 1 or 2 doses of IBU if antiinflammatory is really needed. But should not take much more than that. ?

## 2021-12-10 ENCOUNTER — Other Ambulatory Visit: Payer: Self-pay | Admitting: Cardiology

## 2021-12-31 ENCOUNTER — Ambulatory Visit (INDEPENDENT_AMBULATORY_CARE_PROVIDER_SITE_OTHER): Payer: Medicare Other | Admitting: *Deleted

## 2021-12-31 DIAGNOSIS — Z5181 Encounter for therapeutic drug level monitoring: Secondary | ICD-10-CM | POA: Diagnosis not present

## 2021-12-31 DIAGNOSIS — I4821 Permanent atrial fibrillation: Secondary | ICD-10-CM

## 2021-12-31 DIAGNOSIS — I4891 Unspecified atrial fibrillation: Secondary | ICD-10-CM | POA: Diagnosis not present

## 2021-12-31 LAB — POCT INR: INR: 2.3 (ref 2.0–3.0)

## 2021-12-31 NOTE — Patient Instructions (Signed)
Continue warfarin 1 tablet daily except 1/2 tablet on Wednesdays.   Continue greens Recheck in 6 weeks  

## 2022-01-11 ENCOUNTER — Other Ambulatory Visit: Payer: Self-pay | Admitting: Cardiology

## 2022-01-22 ENCOUNTER — Other Ambulatory Visit: Payer: Self-pay | Admitting: Cardiology

## 2022-01-23 ENCOUNTER — Other Ambulatory Visit: Payer: Self-pay | Admitting: "Endocrinology

## 2022-02-19 ENCOUNTER — Other Ambulatory Visit: Payer: Self-pay | Admitting: Cardiology

## 2022-02-24 ENCOUNTER — Ambulatory Visit (INDEPENDENT_AMBULATORY_CARE_PROVIDER_SITE_OTHER): Payer: Medicare Other | Admitting: *Deleted

## 2022-02-24 DIAGNOSIS — I4821 Permanent atrial fibrillation: Secondary | ICD-10-CM

## 2022-02-24 DIAGNOSIS — I4891 Unspecified atrial fibrillation: Secondary | ICD-10-CM | POA: Diagnosis not present

## 2022-02-24 DIAGNOSIS — Z5181 Encounter for therapeutic drug level monitoring: Secondary | ICD-10-CM

## 2022-02-24 LAB — POCT INR: INR: 3.3 — AB (ref 2.0–3.0)

## 2022-02-24 NOTE — Patient Instructions (Signed)
Hold warfarin tonight then resume 1 tablet daily except 1/2 tablet on Wednesdays.   Continue greens Recheck in 6 weeks

## 2022-02-25 DIAGNOSIS — E7800 Pure hypercholesterolemia, unspecified: Secondary | ICD-10-CM | POA: Diagnosis not present

## 2022-02-25 DIAGNOSIS — I10 Essential (primary) hypertension: Secondary | ICD-10-CM | POA: Diagnosis not present

## 2022-02-25 DIAGNOSIS — Z1329 Encounter for screening for other suspected endocrine disorder: Secondary | ICD-10-CM | POA: Diagnosis not present

## 2022-02-25 DIAGNOSIS — E559 Vitamin D deficiency, unspecified: Secondary | ICD-10-CM | POA: Diagnosis not present

## 2022-02-25 DIAGNOSIS — E1169 Type 2 diabetes mellitus with other specified complication: Secondary | ICD-10-CM | POA: Diagnosis not present

## 2022-02-25 DIAGNOSIS — I509 Heart failure, unspecified: Secondary | ICD-10-CM | POA: Diagnosis not present

## 2022-02-26 ENCOUNTER — Encounter: Payer: Self-pay | Admitting: Cardiology

## 2022-03-02 DIAGNOSIS — Z0001 Encounter for general adult medical examination with abnormal findings: Secondary | ICD-10-CM | POA: Diagnosis not present

## 2022-03-02 DIAGNOSIS — I10 Essential (primary) hypertension: Secondary | ICD-10-CM | POA: Diagnosis not present

## 2022-03-02 DIAGNOSIS — E1169 Type 2 diabetes mellitus with other specified complication: Secondary | ICD-10-CM | POA: Diagnosis not present

## 2022-03-02 DIAGNOSIS — I4891 Unspecified atrial fibrillation: Secondary | ICD-10-CM | POA: Diagnosis not present

## 2022-03-02 DIAGNOSIS — I509 Heart failure, unspecified: Secondary | ICD-10-CM | POA: Diagnosis not present

## 2022-03-10 ENCOUNTER — Other Ambulatory Visit: Payer: Self-pay | Admitting: Cardiology

## 2022-03-15 ENCOUNTER — Other Ambulatory Visit: Payer: Self-pay | Admitting: *Deleted

## 2022-03-15 MED ORDER — METOPROLOL TARTRATE 25 MG PO TABS
25.0000 mg | ORAL_TABLET | Freq: Two times a day (BID) | ORAL | 0 refills | Status: DC
Start: 2022-03-15 — End: 2022-06-08

## 2022-03-17 ENCOUNTER — Other Ambulatory Visit: Payer: Self-pay | Admitting: Cardiology

## 2022-03-18 ENCOUNTER — Other Ambulatory Visit: Payer: Medicare Other

## 2022-03-18 ENCOUNTER — Ambulatory Visit (HOSPITAL_COMMUNITY)
Admission: RE | Admit: 2022-03-18 | Discharge: 2022-03-18 | Disposition: A | Discharge status: Home / Self Care | Payer: Medicare Other | Source: Ambulatory Visit | Attending: Urology | Admitting: Urology

## 2022-03-18 DIAGNOSIS — Z8546 Personal history of malignant neoplasm of prostate: Secondary | ICD-10-CM

## 2022-03-18 DIAGNOSIS — E291 Testicular hypofunction: Secondary | ICD-10-CM

## 2022-03-18 DIAGNOSIS — N200 Calculus of kidney: Secondary | ICD-10-CM | POA: Diagnosis not present

## 2022-03-19 LAB — PSA: Prostate Specific Ag, Serum: 0.2 ng/mL (ref 0.0–4.0)

## 2022-03-19 LAB — TESTOSTERONE: Testosterone: 165 ng/dL — ABNORMAL LOW (ref 264–916)

## 2022-03-25 ENCOUNTER — Ambulatory Visit: Payer: Medicare Other | Admitting: Urology

## 2022-03-25 ENCOUNTER — Encounter: Payer: Self-pay | Admitting: Urology

## 2022-03-25 VITALS — BP 149/79 | HR 58

## 2022-03-25 DIAGNOSIS — R3912 Poor urinary stream: Secondary | ICD-10-CM

## 2022-03-25 DIAGNOSIS — E291 Testicular hypofunction: Secondary | ICD-10-CM

## 2022-03-25 DIAGNOSIS — Z8546 Personal history of malignant neoplasm of prostate: Secondary | ICD-10-CM

## 2022-03-25 DIAGNOSIS — N200 Calculus of kidney: Secondary | ICD-10-CM | POA: Diagnosis not present

## 2022-03-25 MED ORDER — TAMSULOSIN HCL 0.4 MG PO CAPS: 0.4000 mg | ORAL_CAPSULE | Freq: Every day | ORAL | 3 refills | Status: DC

## 2022-03-25 NOTE — Progress Notes (Signed)
Subjective:  1. Renal stones   2. History of adenocarcinoma of prostate   3. Hypogonadism in male   4. Weak urinary stream   5. Renal calculus     03/25/22: Desman returns today in f/u.  His PSA is stable at 0.2 20 years out from EXRT with seeds.  His testosterone level is down some to 165. He has primary testicular dysfunction with elevated gonadotropins.  He remains on tamsulsoin for his BPH with LUTS.  He has had increased nocturia over the last 2-3 months and gets up 3-4 x.  He has a reduced stream.  He has intermittent mild urge with rare UUI.  He has had no hematuria or dysuria.  He started drinking up to 4 cups of coffee in the morning about the time he started having increased nocturia.  He has no change in the 37m LLP stone.  There is a 147mprobable pill shadow in the LLQ. He had a similar shadow in the RUQ on his prior film.  He has been on tadalafil in the past for ED but he hasn't used it in a while.   09/24/21: JaJaeveoneturns today in f/u.  His PSA  remains 0.2.   He has a history of prostate cancer treated with EXRT and seeds in 2003.  He has a history of ED and used tadalafil with a partial response but he isn't on it currently.  His testosterone level is up slightly at 201.   His FSWild Peach Villagend LH from 06/18/21 were elevated at 41 and 24 and his prolactin is 28.8.  He has no gynecomastia, headache or significant changes in eye sight or sense of smell.  He still has had some fatigue.  He is voiding well with an IPSS of 9.  He remains on tamsulosin.  He has a reduced stream  and nocturia x 2.  His IPSS is 7-8 with a weak stream and nocturia 1-2x.    He has a LLP stone that has been enlarging and was 30m730mn 7/20.  It was 10.3mm79m 1/22 and is now 10.6mm 59m4/22 and it is was 11mm 73m0/22.  He didn't get the films I ordered for this visit.  He has had no flank pain or hematuria.        ROS:  ROS:  A complete review of systems was performed.  All systems are negative except for pertinent  findings as noted.   Review of Systems  Constitutional:  Positive for weight loss (with increased activity).  Gastrointestinal: Negative.   Genitourinary:  Positive for urgency. Negative for hematuria.  Musculoskeletal: Negative.     Allergies  Allergen Reactions   Bactrim [Sulfamethoxazole-Trimethoprim] Other (See Comments)    Thin his blood really bad   Other     Cannot mix warfarin and bactrim    Tresiba Flextouch [Insulin Degludec] Rash    Outpatient Encounter Medications as of 03/25/2022  Medication Sig   metFORMIN (GLUCOPHAGE) 500 MG tablet TAKE 1 TABLET BY MOUTH ONCE DAILY AFTER SUPPER   potassium chloride (KLOR-CON) 10 MEQ tablet TAKE 1 TABLET BY MOUTH  DAILY   amLODipine (NORVASC) 5 MG tablet Take 1 tablet by mouth once daily   APPLE CIDER VINEGAR PO Take by mouth.   ASTRAGALUS PO Take by mouth.   Azelastine HCl 137 MCG/SPRAY SOLN Place 2 sprays into both nostrils daily.   Blood Glucose Monitoring Suppl (ACCU-CHEK GUIDE ME) w/Device KIT 1 Piece by Does not apply route as directed.  Cholecalciferol (VITAMIN D3) 125 MCG (5000 UT) TABS Take 5,000 Units by mouth daily.    Flaxseed, Linseed, (FLAX SEEDS PO) Take by mouth.   furosemide (LASIX) 20 MG tablet Take 1 tablet by mouth once daily   GLOBAL EASE INJECT PEN NEEDLES 31G X 8 MM MISC USE 1 DAILY AS DIRECTED - RUN ON CASH $15 - PER PC.   glucose blood (ACCU-CHEK GUIDE) test strip USE 1 STRIP TO CHECK GLUCOSE THREE TIMES DAILY   Insulin Glargine (BASAGLAR KWIKPEN) 100 UNIT/ML INJECT 70 UNITS INTO THE SKIN AT BEDTIME   Insulin Pen Needle (B-D ULTRAFINE III SHORT PEN) 31G X 8 MM MISC 1 each by Does not apply route as directed.   Lancets MISC 1 each by Does not apply route as directed.   lisinopril (ZESTRIL) 20 MG tablet Take 1 tablet by mouth once daily   loratadine (CLARITIN) 10 MG tablet Take 10 mg by mouth daily.   melatonin 5 MG TABS Take 5 mg by mouth.   metoprolol tartrate (LOPRESSOR) 25 MG tablet Take 1 tablet (25 mg  total) by mouth 2 (two) times daily.   omeprazole (PRILOSEC) 20 MG capsule Take 20 mg by mouth daily.    oxymetazoline (AFRIN) 0.05 % nasal spray Place 1 spray into both nostrils 2 (two) times daily as needed for congestion.   pravastatin (PRAVACHOL) 40 MG tablet TAKE 1 TABLET BY MOUTH AT BEDTIME   tadalafil (CIALIS) 20 MG tablet Take 20 mg by mouth daily as needed for erectile dysfunction.   tamsulosin (FLOMAX) 0.4 MG CAPS capsule Take 1 capsule (0.4 mg total) by mouth daily.   warfarin (COUMADIN) 5 MG tablet TAKE 1 TABLET BY MOUTH ONCE DAILY EXCEPT  1/2  TABLET  ON  WEDNESDAYS  OR  AS  DIRECTED   [DISCONTINUED] tamsulosin (FLOMAX) 0.4 MG CAPS capsule Take 1 capsule (0.4 mg total) by mouth daily.   No facility-administered encounter medications on file as of 03/25/2022.    Past Medical History:  Diagnosis Date   Atrial fibrillation [facility])    Reportedly diagnosed November 2011   Essential hypertension    History of kidney stones    Prostate cancer [facility])    Radiation implants   Type 2 diabetes mellitus ([place])     Past Surgical History:  Procedure Laterality Date   Gold seed placement     INGUINAL HERNIA REPAIR Left 07/02/2020   Procedure: LEFT INGUINAL HERNIORRHAPY WITH MESH;  Surgeon: Aviva Signs, MD;  Location: [facility];  Service: General;  Laterality: Left;   LITHOTRIPSY     PROSTATE BIOPSY     TRIGGER FINGER RELEASE Left    ring finger   URETEROSCOPY WITH HOLMIUM LASER LITHOTRIPSY      Social History   Socioeconomic History   Marital status: Divorced    Spouse name: Not on file   Number of children: Not on file   Years of education: Not on file   Highest education level: Not on file  Occupational History   Not on file  Tobacco Use   Smoking status: Never   Smokeless tobacco: Never  Vaping Use   Vaping Use: Never used  Substance and Sexual Activity   Alcohol use: No    Alcohol/week: 0.0 standard drinks of alcohol   Drug use: No   Sexual activity: Yes  Other  Topics Concern   Not on file  Social History Narrative   Not on file   Social Determinants of Health   Financial Resource Strain:  Not on file  Food Insecurity: Not on file  Transportation Needs: Not on file  Physical Activity: Not on file  Stress: Not on file  Social Connections: Not on file  Intimate Partner Violence: Not on file    Family History  Problem Relation Age of Onset   Cancer Father 71       Laryngeal   Lymphoma Mother 50       Objective: Vitals:   03/25/22 1450  BP: (!) 149/79  Pulse: (!) 58     Physical Exam  Lab Results:  No results found for this or any previous visit (from the past 24 hour(s)).   BMET No results for input(s): "NA", "K", "CL", "CO2", "GLUCOSE", "BUN", "CREATININE", "CALCIUM" in the last 72 hours. PSA  Lab Results  Component Value Date   PSA1 0.2 03/18/2022   PSA1 0.2 09/17/2021   PSA1 0.2 06/18/2021      Studies/Results: No results found. Abdomen 1 view (KUB)  Result Date: 03/18/2022 CLINICAL DATA:  Renal stone with no pain. EXAM: ABDOMEN - 1 VIEW COMPARISON:  September 24, 2021 FINDINGS: The bowel gas pattern is normal. A large amount of stool is seen throughout the colon. Adjacent 12 mm and 4 mm soft tissue calcifications are seen projecting over the left kidney. These are present on the prior study. A 12 mm oval-shaped opacity is seen projecting over the mid to lower left abdomen. This is seen at the level of L4-L5 and represents a new finding. IMPRESSION: 1. Stable left renal calculi. 2. New 12 mm oval-shaped opacity projecting over the mid to lower left abdomen. This is of unclear etiology and clinical significance and may represent an ingested pill. Correlation with follow-up abdominal plain film is recommended to determine stability. 3. Large stool burden without evidence of bowel obstruction. Electronically Signed   By: Virgina Norfolk M.D.   On: 03/18/2022 20:42      Assessment & Plan:  History of prostate cancer.   PSA is stable.  Return in a year.   BPH with BOO and increased nocturia that is probably secondary to the increased coffee.  He will cut that back and stay on the tamsulosin.   Testicular atrophy.  His T level is 165.  I discussed TRT but he would like to hold off.   Renal stone.  He has stable 28m LLP stone or stone cluster.  I will reassess with a KUB in 1 year.   The LLQ calcification is most consistent with a pill shadow.     Meds ordered this encounter  Medications   tamsulosin (FLOMAX) 0.4 MG CAPS capsule    Sig: Take 1 capsule (0.4 mg total) by mouth daily.    Dispense:  90 capsule    Refill:  3      Orders Placed This Encounter  Procedures   DG Abd 1 View    Standing Status:   Future    Standing Expiration Date:   03/26/2023    Order Specific Question:   Reason for Exam (SYMPTOM  OR DIAGNOSIS REQUIRED)    Answer:   renal stones    Order Specific Question:   [facility]    [facility]   Order Specific Question:   Radiology Contrast Protocol - do NOT remove file path    Answer:   [email or web address]   Urinalysis, Routine w reflex microscopic   PSA    Standing Status:   Future    Standing  Expiration Date:   03/26/2023      Return in about 1 year (around 03/26/2023) for with a PSA and KUB.   CC: Greenevers Nation, MD  Dr. Bud Face    Irine Seal 03/26/2022 Patient ID: Christiana Pellant, male   DOB: 09-07-1951, 71 y.o.   MRN: [id number]

## 2022-03-29 DIAGNOSIS — I152 Hypertension secondary to endocrine disorders: Secondary | ICD-10-CM | POA: Diagnosis not present

## 2022-03-29 DIAGNOSIS — E1169 Type 2 diabetes mellitus with other specified complication: Secondary | ICD-10-CM | POA: Diagnosis not present

## 2022-03-29 DIAGNOSIS — E1159 Type 2 diabetes mellitus with other circulatory complications: Secondary | ICD-10-CM | POA: Diagnosis not present

## 2022-03-29 LAB — LIPID PANEL
Cholesterol: 149 (ref 0–200)
HDL: 44 (ref 35–70)
LDL Cholesterol: 80
Triglycerides: 125 (ref 40–160)

## 2022-03-29 LAB — HEPATIC FUNCTION PANEL
ALT: 21 U/L (ref 10–40)
AST: 16 (ref 14–40)
Alkaline Phosphatase: 63 (ref 25–125)
Bilirubin, Total: 0.5

## 2022-03-29 LAB — BASIC METABOLIC PANEL
BUN: 20 (ref 4–21)
CO2: 30 — AB (ref 13–22)
Chloride: 104 (ref 99–108)
Creatinine: 1.4 — AB (ref 0.6–1.3)
Glucose: 106
Potassium: 3.9 mEq/L (ref 3.5–5.1)
Sodium: 139 (ref 137–147)

## 2022-03-29 LAB — TSH: TSH: 1.64 (ref 0.41–5.90)

## 2022-03-29 LAB — COMPREHENSIVE METABOLIC PANEL
Albumin: 3.6 (ref 3.5–5.0)
Calcium: 8.8 (ref 8.7–10.7)

## 2022-04-05 ENCOUNTER — Encounter: Payer: Self-pay | Admitting: "Endocrinology

## 2022-04-05 ENCOUNTER — Ambulatory Visit: Payer: Medicare Other | Admitting: "Endocrinology

## 2022-04-05 VITALS — BP 142/86 | HR 60 | Ht 70.0 in | Wt 233.0 lb

## 2022-04-05 DIAGNOSIS — E1159 Type 2 diabetes mellitus with other circulatory complications: Secondary | ICD-10-CM | POA: Diagnosis not present

## 2022-04-05 DIAGNOSIS — E782 Mixed hyperlipidemia: Secondary | ICD-10-CM | POA: Diagnosis not present

## 2022-04-05 DIAGNOSIS — I10 Essential (primary) hypertension: Secondary | ICD-10-CM

## 2022-04-05 DIAGNOSIS — Z794 Long term (current) use of insulin: Secondary | ICD-10-CM | POA: Diagnosis not present

## 2022-04-05 LAB — POCT GLYCOSYLATED HEMOGLOBIN (HGB A1C): HbA1c, POC (controlled diabetic range): 7.4 % — AB (ref 0.0–7.0)

## 2022-04-05 MED ORDER — BASAGLAR KWIKPEN 100 UNIT/ML ~~LOC~~ SOPN: PEN_INJECTOR | SUBCUTANEOUS | 2 refills | Status: DC

## 2022-04-05 NOTE — Patient Instructions (Signed)
                                     Advice for Weight Management  -For most of us the best way to lose weight is by diet management. Generally speaking, diet management means consuming less calories intentionally which over time brings about progressive weight loss.  This can be achieved more effectively by avoiding ultra processed carbohydrates, processed meats, unhealthy fats.    It is critically important to know your numbers: how much calorie you are consuming and how much calorie you need. More importantly, our carbohydrates sources should be unprocessed naturally occurring  complex starch food items.  It is always important to balance nutrition also by  appropriate intake of proteins (mainly plant-based), healthy fats/oils, plenty of fruits and vegetables.   -The American College of Lifestyle Medicine (ACL M) recommends nutrition derived mostly from Whole Food, Plant Predominant Sources example an apple instead of applesauce or apple pie. Eat Plenty of vegetables, Mushrooms, fruits, Legumes, Whole Grains, Nuts, seeds in lieu of processed meats, processed snacks/pastries red meat, poultry, eggs.  Use only water or unsweetened tea for hydration.  The College also recommends the need to stay away from risky substances including alcohol, smoking; obtaining 7-9 hours of restorative sleep, at least 150 minutes of moderate intensity exercise weekly, importance of healthy social connections, and being mindful of stress and seek help when it is overwhelming.    -Sticking to a routine mealtime to eat 3 meals a day and avoiding unnecessary snacks is shown to have a big role in weight control. Under normal circumstances, the only time we burn stored energy is when we are hungry, so allow  some hunger to take place- hunger means no food between appropriate meal times, only water.  It is not advisable to starve.   -It is better to avoid simple carbohydrates including:  Cakes, Sweet Desserts, Ice Cream, Soda (diet and regular), Sweet Tea, Candies, Chips, Cookies, Store Bought Juices, Alcohol in Excess of  1-2 drinks a day, Lemonade,  Artificial Sweeteners, Doughnuts, Coffee Creamers, "Sugar-free" Products, etc, etc.  This is not a complete list.....    -Consulting with certified diabetes educators is proven to provide you with the most accurate and current information on diet.  Also, you may be  interested in discussing diet options/exchanges , we can schedule a visit with Daniel Schaefer, RDN, CDE for individualized nutrition education.  -Exercise: If you are able: 30 -60 minutes a day ,4 days a week, or 150 minutes of moderate intensity exercise weekly.    The longer the better if tolerated.  Combine stretch, strength, and aerobic activities.  If you were told in the past that you have high risk for cardiovascular diseases, or if you are currently symptomatic, you may seek evaluation by your heart doctor prior to initiating moderate to intense exercise programs.                                  Additional Care Considerations for Diabetes/Prediabetes   -Diabetes  is a chronic disease.  The most important care consideration is regular follow-up with your diabetes care provider with the goal being avoiding or delaying its complications and to take advantage of advances in medications and technology.  If appropriate actions are taken early enough, type 2 diabetes can even be   reversed.  Seek information from the right source.  - Whole Food, Plant Predominant Nutrition is highly recommended: Eat Plenty of vegetables, Mushrooms, fruits, Legumes, Whole Grains, Nuts, seeds in lieu of processed meats, processed snacks/pastries red meat, poultry, eggs as recommended by American College of  Lifestyle Medicine (ACLM).  -Type 2 diabetes is known to coexist with other important comorbidities such as high blood pressure and high cholesterol.  It is critical to control not only the  diabetes but also the high blood pressure and high cholesterol to minimize and delay the risk of complications including coronary artery disease, stroke, amputations, blindness, etc.  The good news is that this diet recommendation for type 2 diabetes is also very helpful for managing high cholesterol and high blood blood pressure.  - Studies showed that people with diabetes will benefit from a class of medications known as ACE inhibitors and statins.  Unless there are specific reasons not to be on these medications, the standard of care is to consider getting one from these groups of medications at an optimal doses.  These medications are generally considered safe and proven to help protect the heart and the kidneys.    - People with diabetes are encouraged to initiate and maintain regular follow-up with eye doctors, foot doctors, dentists , and if necessary heart and kidney doctors.     - It is highly recommended that people with diabetes quit smoking or stay away from smoking, and get yearly  flu vaccine and pneumonia vaccine at least every 5 years.  See above for additional recommendations on exercise, sleep, stress management , and healthy social connections.      

## 2022-04-05 NOTE — Progress Notes (Signed)
04/05/2022  Endocrinology follow-up note   Subjective:    Patient ID: Daniel Schaefer, male    DOB: 08/23/1951,    Past Medical History:  Diagnosis Date   Atrial fibrillation [facility])    Reportedly diagnosed November 2011   Essential hypertension    History of kidney stones    Prostate cancer [facility])    Radiation implants   Type 2 diabetes mellitus ([place])    Past Surgical History:  Procedure Laterality Date   Gold seed placement     INGUINAL HERNIA REPAIR Left 07/02/2020   Procedure: LEFT INGUINAL HERNIORRHAPY WITH MESH;  Surgeon: Aviva Signs, MD;  Location: [facility];  Service: General;  Laterality: Left;   LITHOTRIPSY     PROSTATE BIOPSY     TRIGGER FINGER RELEASE Left    ring finger   URETEROSCOPY WITH HOLMIUM LASER LITHOTRIPSY     Social History   Socioeconomic History   Marital status: Divorced    Spouse name: Not on file   Number of children: Not on file   Years of education: Not on file   Highest education level: Not on file  Occupational History   Not on file  Tobacco Use   Smoking status: Never   Smokeless tobacco: Never  Vaping Use   Vaping Use: Never used  Substance and Sexual Activity   Alcohol use: No    Alcohol/week: 0.0 standard drinks of alcohol   Drug use: No   Sexual activity: Yes  Other Topics Concern   Not on file  Social History Narrative   Not on file   Social Determinants of Health   Financial Resource Strain: Not on file  Food Insecurity: Not on file  Transportation Needs: Not on file  Physical Activity: Not on file  Stress: Not on file  Social Connections: Not on file   Outpatient Encounter Medications as of 04/05/2022  Medication Sig   metFORMIN (GLUCOPHAGE) 500 MG tablet TAKE 1 TABLET BY MOUTH ONCE DAILY AFTER SUPPER   potassium chloride (KLOR-CON) 10 MEQ tablet TAKE 1 TABLET BY MOUTH  DAILY   amLODipine (NORVASC) 5 MG tablet Take 1 tablet by mouth once daily   Azelastine HCl 137 MCG/SPRAY SOLN Place 2 sprays into both nostrils  daily.   Blood Glucose Monitoring Suppl (ACCU-CHEK GUIDE ME) w/Device KIT 1 Piece by Does not apply route as directed.   Cholecalciferol (VITAMIN D3) 125 MCG (5000 UT) TABS Take 5,000 Units by mouth daily.    Flaxseed, Linseed, (FLAX SEEDS PO) Take by mouth.   furosemide (LASIX) 20 MG tablet Take 1 tablet by mouth once daily   GLOBAL EASE INJECT PEN NEEDLES 31G X 8 MM MISC USE 1 DAILY AS DIRECTED - RUN ON CASH $15 - PER PC.   glucose blood (ACCU-CHEK GUIDE) test strip USE 1 STRIP TO CHECK GLUCOSE THREE TIMES DAILY   Insulin Glargine (BASAGLAR KWIKPEN) 100 UNIT/ML INJECT 60 UNITS INTO THE SKIN AT BEDTIME   Insulin Pen Needle (B-D ULTRAFINE III SHORT PEN) 31G X 8 MM MISC 1 each by Does not apply route as directed.   Lancets MISC 1 each by Does not apply route as directed.   lisinopril (ZESTRIL) 20 MG tablet Take 1 tablet by mouth once daily   loratadine (CLARITIN) 10 MG tablet Take 10 mg by mouth daily.   melatonin 5 MG TABS Take 5 mg by mouth.   metoprolol tartrate (LOPRESSOR) 25 MG tablet Take 1 tablet (25 mg total) by mouth 2 (two) times daily.  omeprazole (PRILOSEC) 20 MG capsule Take 20 mg by mouth daily.    oxymetazoline (AFRIN) 0.05 % nasal spray Place 1 spray into both nostrils 2 (two) times daily as needed for congestion.   pravastatin (PRAVACHOL) 40 MG tablet TAKE 1 TABLET BY MOUTH AT BEDTIME   tadalafil (CIALIS) 20 MG tablet Take 20 mg by mouth daily as needed for erectile dysfunction.   tamsulosin (FLOMAX) 0.4 MG CAPS capsule Take 1 capsule (0.4 mg total) by mouth daily.   warfarin (COUMADIN) 5 MG tablet TAKE 1 TABLET BY MOUTH ONCE DAILY EXCEPT  1/2  TABLET  ON  WEDNESDAYS  OR  AS  DIRECTED   [DISCONTINUED] APPLE CIDER VINEGAR PO Take by mouth.   [DISCONTINUED] ASTRAGALUS PO Take by mouth.   [DISCONTINUED] Insulin Glargine (BASAGLAR KWIKPEN) 100 UNIT/ML INJECT 70 UNITS INTO THE SKIN AT BEDTIME   No facility-administered encounter medications on file as of 04/05/2022.    ALLERGIES: Allergies  Allergen Reactions   Bactrim [Sulfamethoxazole-Trimethoprim] Other (See Comments)    Thin his blood really bad   Other     Cannot mix warfarin and bactrim    [name] Flextouch [Insulin Degludec] Rash   VACCINATION STATUS: Immunization History  Administered Date(s) Administered   [employer] Vaccination 10/04/2019, 11/05/2019    Diabetes He presents for his follow-up diabetic visit. He has type 2 diabetes mellitus. Onset time: He was diagnosed at approximate age of 37 years. His disease course has been fluctuating. There are no hypoglycemic associated symptoms. There are no diabetic associated symptoms. There are no hypoglycemic complications. Symptoms are worsening. Diabetic complications include nephropathy. Risk factors for coronary artery disease include hypertension, dyslipidemia, sedentary lifestyle, male sex and diabetes mellitus. Current diabetic treatment includes oral agent (monotherapy) and insulin injections. He is compliant with treatment most of the time. His weight is fluctuating minimally. He is following a generally unhealthy diet. When asked about meal planning, he reported none. He has not had a previous visit with a dietitian. He rarely participates in exercise. His home blood glucose trend is fluctuating minimally. His breakfast blood glucose range is generally 130-140 mg/dl. His bedtime blood glucose range is generally 180-200 mg/dl. His overall blood glucose range is 180-200 mg/dl. (He presents today with his meter and logs showing improving glycemic profile.  He does have tightening fasting glycemic profile.  His point-of-care A1c 7.4% improving from 8%.  ) An ACE inhibitor/angiotensin II receptor blocker is being taken. He does not see a podiatrist.Eye exam is current.  Hypertension This is a chronic problem. The current episode started more than 1 year ago. The problem is unchanged. The problem is controlled. There are no associated agents  to hypertension. Risk factors for coronary artery disease include diabetes mellitus, dyslipidemia, male gender and sedentary lifestyle. Past treatments include ACE inhibitors, calcium channel blockers, diuretics and beta blockers. The current treatment provides moderate improvement. There are no compliance problems.  Hypertensive end-organ damage includes kidney disease. Identifiable causes of hypertension include chronic renal disease.  Hyperlipidemia This is a chronic problem. The current episode started more than 1 year ago. The problem is controlled. Recent lipid tests were reviewed and are normal. Exacerbating diseases include chronic renal disease and diabetes. Factors aggravating his hyperlipidemia include beta blockers. Current antihyperlipidemic treatment includes statins. The current treatment provides moderate improvement of lipids. There are no compliance problems.  Risk factors for coronary artery disease include dyslipidemia, diabetes mellitus, hypertension, male sex and a sedentary lifestyle.    Review of systems  Constitutional: +  Minimally fluctuating body weight,  current Body mass index is 33.43 kg/m. , no fatigue, no subjective hyperthermia, no subjective hypothermia    Objective:    BP (!) 142/86   Pulse 60   Ht _0  (1.778 m)   Wt 233 lb (105.7 kg)   BMI 33.43 kg/m   Wt Readings from Last 3 Encounters:  04/05/22 233 lb (105.7 kg)  10/05/21 235 lb 6.4 oz (106.8 kg)  09/24/21 232 lb (105.2 kg)    BP Readings from Last 3 Encounters:  04/05/22 (!) 142/86  03/25/22 (!) 149/79  10/05/21 118/76     Physical Exam- Limited  Constitutional:  Body mass index is 33.43 kg/m. , not in acute distress, normal state of mind Eyes:  EOMI, no exophthalmos     Results for orders placed or performed in visit on 04/05/22  HgB A1c  Result Value Ref Range   Hemoglobin A1C     HbA1c POC (<> result, manual entry)     HbA1c, POC (prediabetic range)     HbA1c, POC  (controlled diabetic range) 7.4 (A) 0.0 - 7.0 %   Complete Blood Count (Most recent): Lab Results  Component Value Date   WBC 6.6 08/31/2013   HGB 14.0 08/31/2013   HCT 40.1 08/31/2013   MCV 75.9 (L) 08/31/2013   PLT 235 08/31/2013   Chemistry (most recent): Lab Results  Component Value Date   NA 139 03/29/2022   K 3.9 03/29/2022   CL 104 03/29/2022   CO2 30 (A) 03/29/2022   BUN 20 03/29/2022   CREATININE 1.4 (A) 03/29/2022   Lipid Panel     Component Value Date/Time   CHOL 149 03/29/2022 0000   TRIG 125 03/29/2022 0000   HDL 44 03/29/2022 0000   LDLCALC 80 03/29/2022 0000    Assessment & Plan:   1) Type 2 diabetes mellitus with stage 3 renal insufficiency, with long-term current use of insulin ([place])  He presents today with his meter and logs showing improving glycemic profile.  He does have tightening fasting glycemic profile.  His point-of-care A1c 7.4% improving from 8%.    -Patient remains at a high risk for more acute and chronic complications of diabetes which include CAD, CVA, CKD, retinopathy, and neuropathy. These are all discussed in detail with the patient.  -Recent labs reviewed showing improved renal function.  - Nutritional counseling repeated at each appointment due to patients tendency to fall back in to old habits.  - he acknowledges that there is a room for improvement in his food and drink choices. - Suggestion is made for him to avoid simple carbohydrates  from his diet including Cakes, Sweet Desserts, Ice Cream, Soda (diet and regular), Sweet Tea, Candies, Chips, Cookies, Store Bought Juices, Alcohol , Artificial Sweeteners,  Coffee Creamer, and "Sugar-free" Products, Lemonade. This will help patient to have more stable blood glucose profile and potentially avoid unintended weight gain.  The following Lifestyle Medicine recommendations according to [place]  [facility]) were discussed and and offered to patient and he  agrees  to start the journey:  A. Whole Foods, Plant-Based Nutrition comprising of fruits and vegetables, plant-based proteins, whole-grain carbohydrates was discussed in detail with the patient.   A list for source of those nutrients were also provided to the patient.  Patient will use only water or unsweetened tea for hydration. B.  The need to stay away from risky substances including alcohol, smoking; obtaining 7 to 9 hours of  restorative sleep, at least 150 minutes of moderate intensity exercise weekly, the importance of healthy social    - Patient is advised to stick to a routine mealtimes to eat 3 meals a day and avoid unnecessary snacks (to snack only to correct hypoglycemia).  -He is following with Jearld Fenton, CDE for diabetes education.  - Based on his glycemic profile he is advised to lower his Basaglar to 60 units nightly, continue metformin 500 mg p.o. twice daily.     He worries about cost of medications, previously declined offer for Trulicity/Ozempic weekly injections.   He has sulfa allergy, not a candidate for glipizide.   -He is encouraged to continue monitoring blood glucose twice daily, before breakfast and before bed, and to call the clinic if he has readings less than 70 or greater than 200 for 3 tests in a row.  - He is generally hesitant to add medications.  - Patient specific target  for A1c; LDL, HDL, Triglycerides, and  Waist Circumference were discussed in detail.  2) BP/HTN: -His blood pressure is not controlled to target.  He is advised to continue Norvasc 5 mg po daily, Lasix 20 mg po daily, Lisinopril 20 mg po daily, and Metoprolol 25 mg po twice daily.  3) Lipids/HPL:  His most recent lipid panel from 03/24/20 shows controlled LDL at 79.    He is advised to continue pravastatin 40 mg p.o. daily at bedtime.  Side effects and precautions discussed with him.  He recently had his labs drawn at his PCP, will request copy.  4)  Weight/Diet:  His Body mass index is  33.43 kg/m.-candidate for some weight loss.  He is following with Jearld Fenton, CD for diabetes education.  No success in weight loss,  exercise, and carbohydrates information provided.  5) Chronic Care/Health Maintenance: -Patient on ACEI and Statin medications and encouraged to continue to follow up with Ophthalmology, Podiatrist at least yearly or according to recommendations, and advised to stay away from smoking. I have recommended yearly flu vaccine and pneumonia vaccination at least every 5 years; moderate intensity exercise for up to 150 minutes weekly; and  sleep for at least 7 hours a day.   He is advised to maintain close follow-up with his PMD Dr. Huel Cote.   I spent 33 minutes in the care of the patient today including review of labs from [place], Lipids, Thyroid Function, Hematology (current and previous including abstractions from other facilities); face-to-face time discussing  his blood glucose readings/logs, discussing hypoglycemia and hyperglycemia episodes and symptoms, medications doses, his options of short and long term treatment based on the latest standards of care / guidelines;  discussion about incorporating lifestyle medicine;  and documenting the encounter. Risk reduction counseling performed per USPSTF guidelines to reduce obesity and cardiovascular risk factors.     Please refer to Patient Instructions for Blood Glucose Monitoring and Insulin/Medications Dosing Guide"  in media tab for additional information. Please  also refer to " Patient Self Inventory" in the Media  tab for reviewed elements of pertinent patient history.  Daniel Schaefer participated in the discussions, expressed understanding, and voiced agreement with the above plans.  All questions were answered to his satisfaction. he is encouraged to contact clinic should he have any questions or concerns prior to his return visit.     Follow up plan: Return in about 3 months (around 07/06/2022) for F/U with  Pre-visit Labs, Meter/CGM/Logs, A1c here.  Rayetta Pigg, FNP-BC [facility] [address]  [place], [place] [address] Phone: [phone] Fax: [phone]  04/05/2022, 4:20 PM

## 2022-04-06 ENCOUNTER — Ambulatory Visit (INDEPENDENT_AMBULATORY_CARE_PROVIDER_SITE_OTHER): Payer: Medicare Other | Admitting: *Deleted

## 2022-04-06 DIAGNOSIS — Z5181 Encounter for therapeutic drug level monitoring: Secondary | ICD-10-CM | POA: Diagnosis not present

## 2022-04-06 DIAGNOSIS — I4891 Unspecified atrial fibrillation: Secondary | ICD-10-CM

## 2022-04-06 DIAGNOSIS — I4821 Permanent atrial fibrillation: Secondary | ICD-10-CM | POA: Diagnosis not present

## 2022-04-06 LAB — POCT INR: INR: 2.1 (ref 2.0–3.0)

## 2022-04-06 NOTE — Patient Instructions (Signed)
Continue warfarin 1 tablet daily except 1/2 tablet on Wednesdays.   Continue greens Recheck in 6 weeks  Started [employer]

## 2022-04-12 ENCOUNTER — Other Ambulatory Visit: Payer: Self-pay | Admitting: Cardiology

## 2022-04-22 ENCOUNTER — Other Ambulatory Visit: Payer: Self-pay | Admitting: Cardiology

## 2022-05-02 ENCOUNTER — Other Ambulatory Visit: Payer: Self-pay | Admitting: "Endocrinology

## 2022-05-18 ENCOUNTER — Other Ambulatory Visit: Payer: Self-pay | Admitting: Cardiology

## 2022-05-18 ENCOUNTER — Other Ambulatory Visit: Payer: Self-pay | Admitting: "Endocrinology

## 2022-05-18 ENCOUNTER — Ambulatory Visit: Payer: Medicare Other | Attending: Cardiology | Admitting: *Deleted

## 2022-05-18 DIAGNOSIS — I4821 Permanent atrial fibrillation: Secondary | ICD-10-CM | POA: Diagnosis not present

## 2022-05-18 DIAGNOSIS — Z5181 Encounter for therapeutic drug level monitoring: Secondary | ICD-10-CM

## 2022-05-18 LAB — POCT INR: INR: 2.7 (ref 2.0–3.0)

## 2022-05-18 NOTE — Patient Instructions (Signed)
Continue warfarin 1 tablet daily except 1/2 tablet on Wednesdays.   Continue greens Recheck in 6 weeks  Started [employer]

## 2022-06-01 DIAGNOSIS — E1169 Type 2 diabetes mellitus with other specified complication: Secondary | ICD-10-CM | POA: Diagnosis not present

## 2022-06-01 DIAGNOSIS — I509 Heart failure, unspecified: Secondary | ICD-10-CM | POA: Diagnosis not present

## 2022-06-01 DIAGNOSIS — I10 Essential (primary) hypertension: Secondary | ICD-10-CM | POA: Diagnosis not present

## 2022-06-01 LAB — BASIC METABOLIC PANEL
BUN: 19 (ref 4–21)
Creatinine: 1.4 — AB (ref 0.6–1.3)

## 2022-06-01 LAB — HEMOGLOBIN A1C: Hemoglobin A1C: 7.4

## 2022-06-08 ENCOUNTER — Other Ambulatory Visit: Payer: Self-pay | Admitting: Cardiology

## 2022-06-29 ENCOUNTER — Ambulatory Visit: Payer: Medicare Other | Attending: Cardiology | Admitting: *Deleted

## 2022-06-29 DIAGNOSIS — Z5181 Encounter for therapeutic drug level monitoring: Secondary | ICD-10-CM

## 2022-06-29 DIAGNOSIS — I4891 Unspecified atrial fibrillation: Secondary | ICD-10-CM | POA: Diagnosis not present

## 2022-06-29 DIAGNOSIS — I4821 Permanent atrial fibrillation: Secondary | ICD-10-CM

## 2022-06-29 LAB — POCT INR: INR: 2.5 (ref 2.0–3.0)

## 2022-06-29 NOTE — Patient Instructions (Signed)
Continue warfarin 1 tablet daily except 1/2 tablet on Wednesdays.   Continue greens Recheck in 6 weeks  Started [employer]

## 2022-06-30 NOTE — Progress Notes (Unsigned)
Cardiology Office Note  Date: 07/01/2022   ID: Miachel, Schaefer [birth year], MRN [id number]  PCP:  Appleby Nation, MD  Cardiologist:  Rozann Lesches, MD Electrophysiologist:  None   Chief Complaint  Patient presents with   Cardiac follow-up    History of Present Illness: Daniel Schaefer is a 71 y.o. male last seen in November 2022.  He is here for a follow-up visit.  States that he has been exercising with Daniel Schaefer and does well, but at other times such as walking out to his car or coming in from the parking lot he feels more short of breath and fatigue.  No chest tightness or sense of palpitations.  He remains on Coumadin with follow-up in the anticoagulation clinic.  Recent INR 2.5.  No spontaneous bleeding problems.  We went over his medications which are noted below.  His heart rate does tend to run slow even on low-dose Lopressor.  I personally reviewed his ECG today which shows atrial fibrillation at 61 bpm, PVC and nonspecific T wave changes.  We discussed reduction and possibly discontinuation of beta-blocker to see if this helps his stamina and shortness of breath.  Also plan a follow-up echocardiogram.  Past Medical History:  Diagnosis Date   Atrial fibrillation Daniel Schaefer)    Reportedly diagnosed November 2011   Essential hypertension    History of kidney stones    Prostate cancer Daniel Schaefer)    Radiation implants   Type 2 diabetes mellitus (Daniel Schaefer)     Past Surgical History:  Procedure Laterality Date   Gold seed placement     INGUINAL HERNIA REPAIR Left 07/02/2020   Procedure: LEFT INGUINAL HERNIORRHAPY WITH MESH;  Surgeon: Aviva Signs, MD;  Location: [facility];  Service: General;  Laterality: Left;   LITHOTRIPSY     PROSTATE BIOPSY     TRIGGER FINGER RELEASE Left    ring finger   URETEROSCOPY WITH HOLMIUM LASER LITHOTRIPSY      Current Outpatient Medications  Medication Sig Dispense Refill   ACCU-CHEK GUIDE test strip USE 1 STRIP TO CHECK GLUCOSE  THREE TIMES DAILY 100 each 2   amLODipine (NORVASC) 5 MG tablet Take 1 tablet by mouth once daily 90 tablet 3   Azelastine HCl 137 MCG/SPRAY SOLN Place 2 sprays into both nostrils daily.     Blood Glucose Monitoring Suppl (ACCU-CHEK GUIDE ME) w/Device KIT 1 Piece by Does not apply route as directed. 1 kit 0   Cholecalciferol (VITAMIN D3) 125 MCG (5000 UT) TABS Take 5,000 Units by mouth daily.      Flaxseed, Linseed, (FLAX SEEDS PO) Take by mouth.     furosemide (LASIX) 20 MG tablet Take 1 tablet by mouth once daily 90 tablet 0   GLOBAL EASE INJECT PEN NEEDLES 31G X 8 MM MISC USE 1 DAILY AS DIRECTED - RUN ON CASH $15 - PER PC. 100 each 5   Insulin Glargine (BASAGLAR KWIKPEN) 100 UNIT/ML INJECT 60 UNITS INTO THE SKIN AT BEDTIME 30 mL 2   Insulin Pen Needle (B-D ULTRAFINE III SHORT PEN) 31G X 8 MM MISC 1 each by Does not apply route as directed. 50 each 2   Lancets MISC 1 each by Does not apply route as directed. 100 each 3   levocetirizine (XYZAL) 5 MG tablet SMARTSIG:1 Tablet(s) By Mouth Every Evening     lisinopril (ZESTRIL) 20 MG tablet Take 1 tablet by mouth once daily 90 tablet 1   loratadine (CLARITIN) 10 MG tablet Take  10 mg by mouth daily.     melatonin 5 MG TABS Take 5 mg by mouth.     metFORMIN (GLUCOPHAGE) 500 MG tablet TAKE 1 TABLET BY MOUTH ONCE DAILY AFTER SUPPER 90 tablet 0   metoprolol tartrate (LOPRESSOR) 25 MG tablet Take 1 tablet by mouth twice daily 180 tablet 0   omeprazole (PRILOSEC) 20 MG capsule Take 20 mg by mouth daily.      oxymetazoline (AFRIN) 0.05 % nasal spray Place 1 spray into both nostrils 2 (two) times daily as needed for congestion.     potassium chloride (KLOR-CON) 10 MEQ tablet TAKE 1 TABLET BY MOUTH DAILY 100 tablet 2   pravastatin (PRAVACHOL) 40 MG tablet TAKE 1 TABLET BY MOUTH AT BEDTIME 90 tablet 0   tadalafil (CIALIS) 20 MG tablet Take 20 mg by mouth daily as needed for erectile dysfunction.     tamsulosin (FLOMAX) 0.4 MG CAPS capsule Take 1 capsule (0.4  mg total) by mouth daily. 90 capsule 3   warfarin (COUMADIN) 5 MG tablet TAKE 1 TABLET BY MOUTH ONCE DAILY EXCEPT  1/2  TABLET  ON  WEDNESDAYS  OR  AS  DIRECTED 35 tablet 5   No current facility-administered medications for this visit.   Allergies:  Bactrim [sulfamethoxazole-trimethoprim], Other, and Tresiba flextouch [insulin degludec]   ROS:  No syncope.  Physical Exam: VS:  BP 138/80   Pulse 61   Ht 5' 10.5" (1.791 m)   Wt 232 lb (105.2 kg)   SpO2 98%   BMI 32.82 kg/m , BMI Body mass index is 32.82 kg/m.  Wt Readings from Last 3 Encounters:  07/01/22 232 lb (105.2 kg)  04/05/22 233 lb (105.7 kg)  10/05/21 235 lb 6.4 oz (106.8 kg)    General: Patient appears comfortable at rest. HEENT: Conjunctiva and lids normal. Neck: Supple, no elevated JVP or carotid bruits. Lungs: Clear to auscultation, nonlabored breathing at rest. Cardiac: Atrial fibrillation, no S3 or significant systolic murmur, no pericardial rub. Abdomen: Soft, nontender, bowel sounds present. Extremities: No pitting edema.  ECG:  An ECG dated 02/04/2021 was personally reviewed today and demonstrated:  Atrial fibrillation with R' in lead V1 and V2.  Recent Labwork: 03/29/2022: ALT 21; AST 16; BUN 20; Creatinine 1.4; Potassium 3.9; Sodium 139; TSH 1.64     Component Value Date/Time   CHOL 149 03/29/2022 0000   TRIG 125 03/29/2022 0000   HDL 44 03/29/2022 0000   [place] 80 03/29/2022 0000    Other Studies Reviewed Today:  No interval cardiac testing for review today.  Assessment and Plan:  1.  Permanent atrial fibrillation with CHA2DS2-VASc score of 3.  Plan to continue Coumadin for stroke prophylaxis and follow-up in the anticoagulation clinic.  He has had some shortness of breath as discussed above.  Plan to reduce Lopressor to 12.5 mg twice daily, may discontinue altogether.  Also obtain follow-up echocardiogram to ensure structurally normal heart.  2.  Essential hypertension, no changes made to present  regimen.  He is also on lisinopril and Norvasc.  Keep follow-up with PCP.  Medication Adjustments/Labs and Tests Ordered: Current medicines are reviewed at length with the patient today.  Concerns regarding medicines are outlined above.   Tests Ordered: Orders Placed This Encounter  Procedures   EKG 12-Lead   ECHOCARDIOGRAM COMPLETE    Medication Changes: No orders of the defined types were placed in this encounter.   Disposition:  Follow up  6 months.  Signed, Satira Sark, MD, [facility]  07/01/2022 11:40 AM    [facility] [address], [place], [place] [address] Phone: [phone]; Fax: [phone]

## 2022-07-01 ENCOUNTER — Encounter: Payer: Self-pay | Admitting: Cardiology

## 2022-07-01 ENCOUNTER — Ambulatory Visit: Payer: Medicare Other | Attending: Cardiology | Admitting: Cardiology

## 2022-07-01 VITALS — BP 138/80 | HR 61 | Ht 70.5 in | Wt 232.0 lb

## 2022-07-01 DIAGNOSIS — I10 Essential (primary) hypertension: Secondary | ICD-10-CM

## 2022-07-01 DIAGNOSIS — Z794 Long term (current) use of insulin: Secondary | ICD-10-CM | POA: Diagnosis not present

## 2022-07-01 DIAGNOSIS — R0602 Shortness of breath: Secondary | ICD-10-CM | POA: Diagnosis not present

## 2022-07-01 DIAGNOSIS — I4821 Permanent atrial fibrillation: Secondary | ICD-10-CM

## 2022-07-01 DIAGNOSIS — E1159 Type 2 diabetes mellitus with other circulatory complications: Secondary | ICD-10-CM | POA: Diagnosis not present

## 2022-07-01 IMAGING — DX DG ABDOMEN 1V
3 series · 3 of 3 positions shown · non-contrast
Comparison: 12/18/2020

CLINICAL DATA: Renal stone. History of prostate cancer with seed
implants.

EXAM:
ABDOMEN - 1 VIEW

[abdomen kub (1 of 3)]
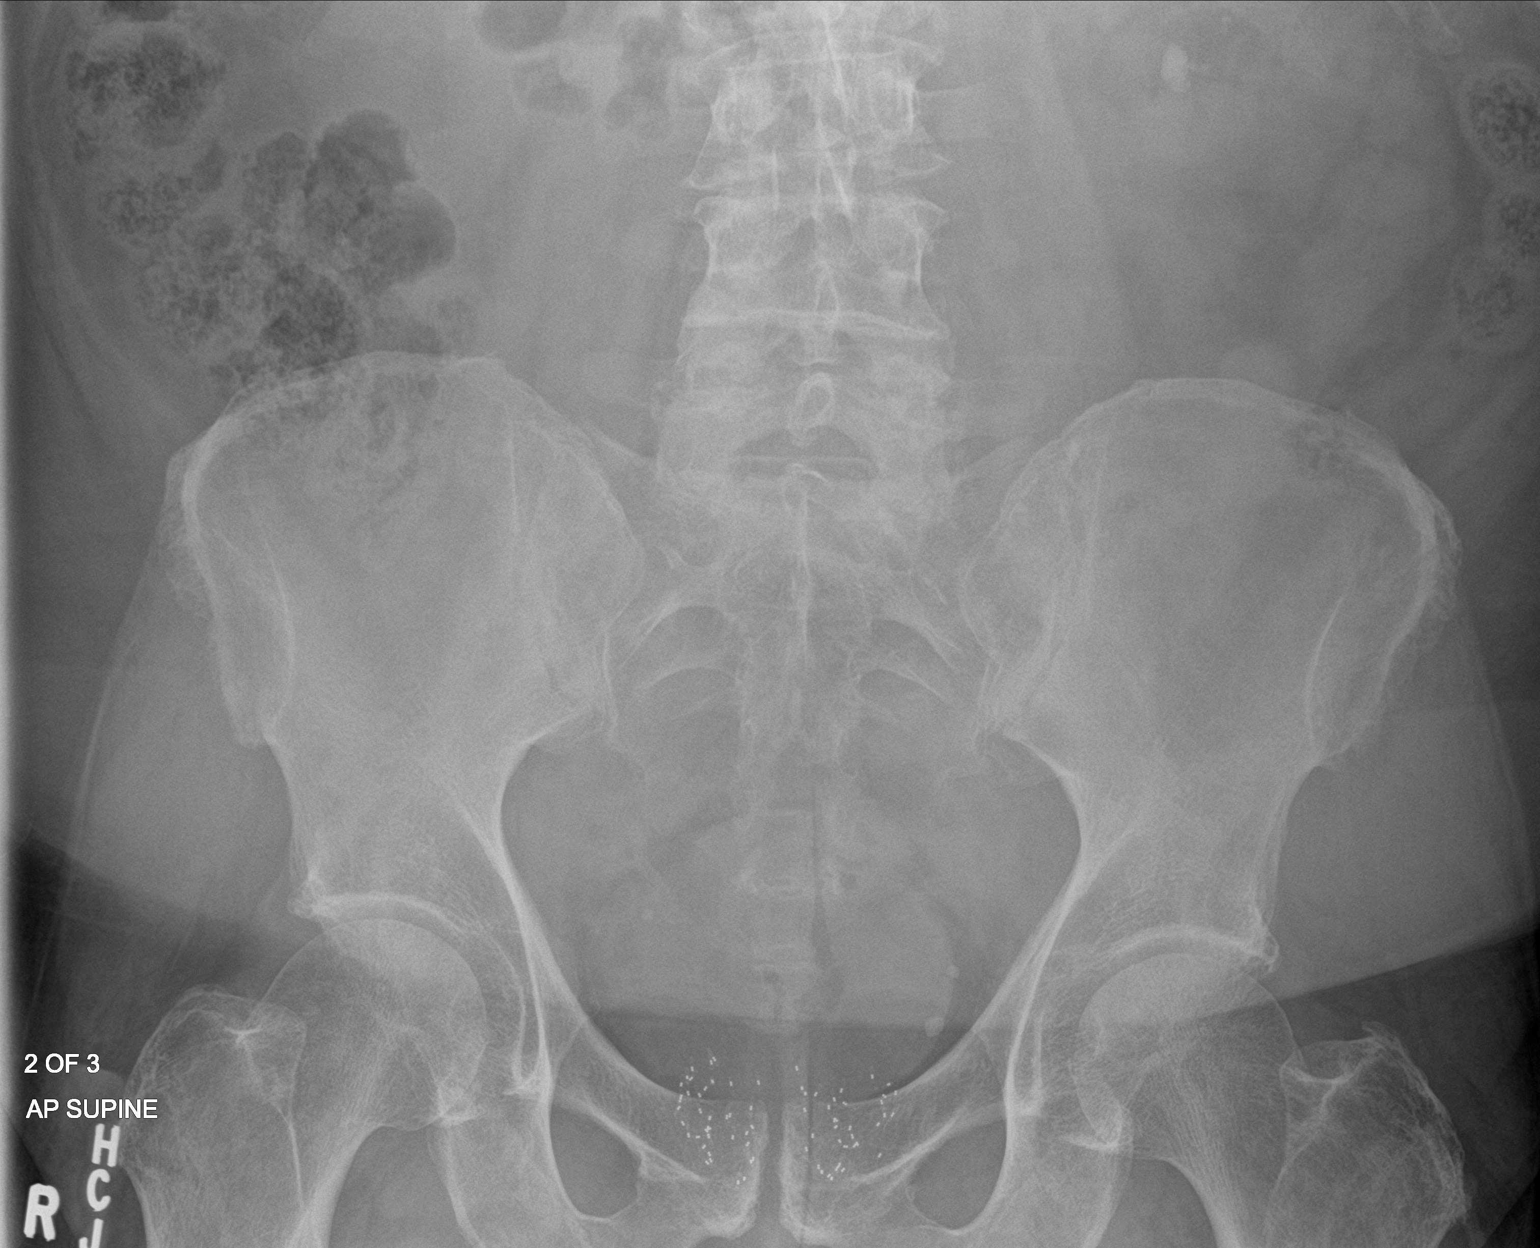

[abdomen kub (2 of 3)]
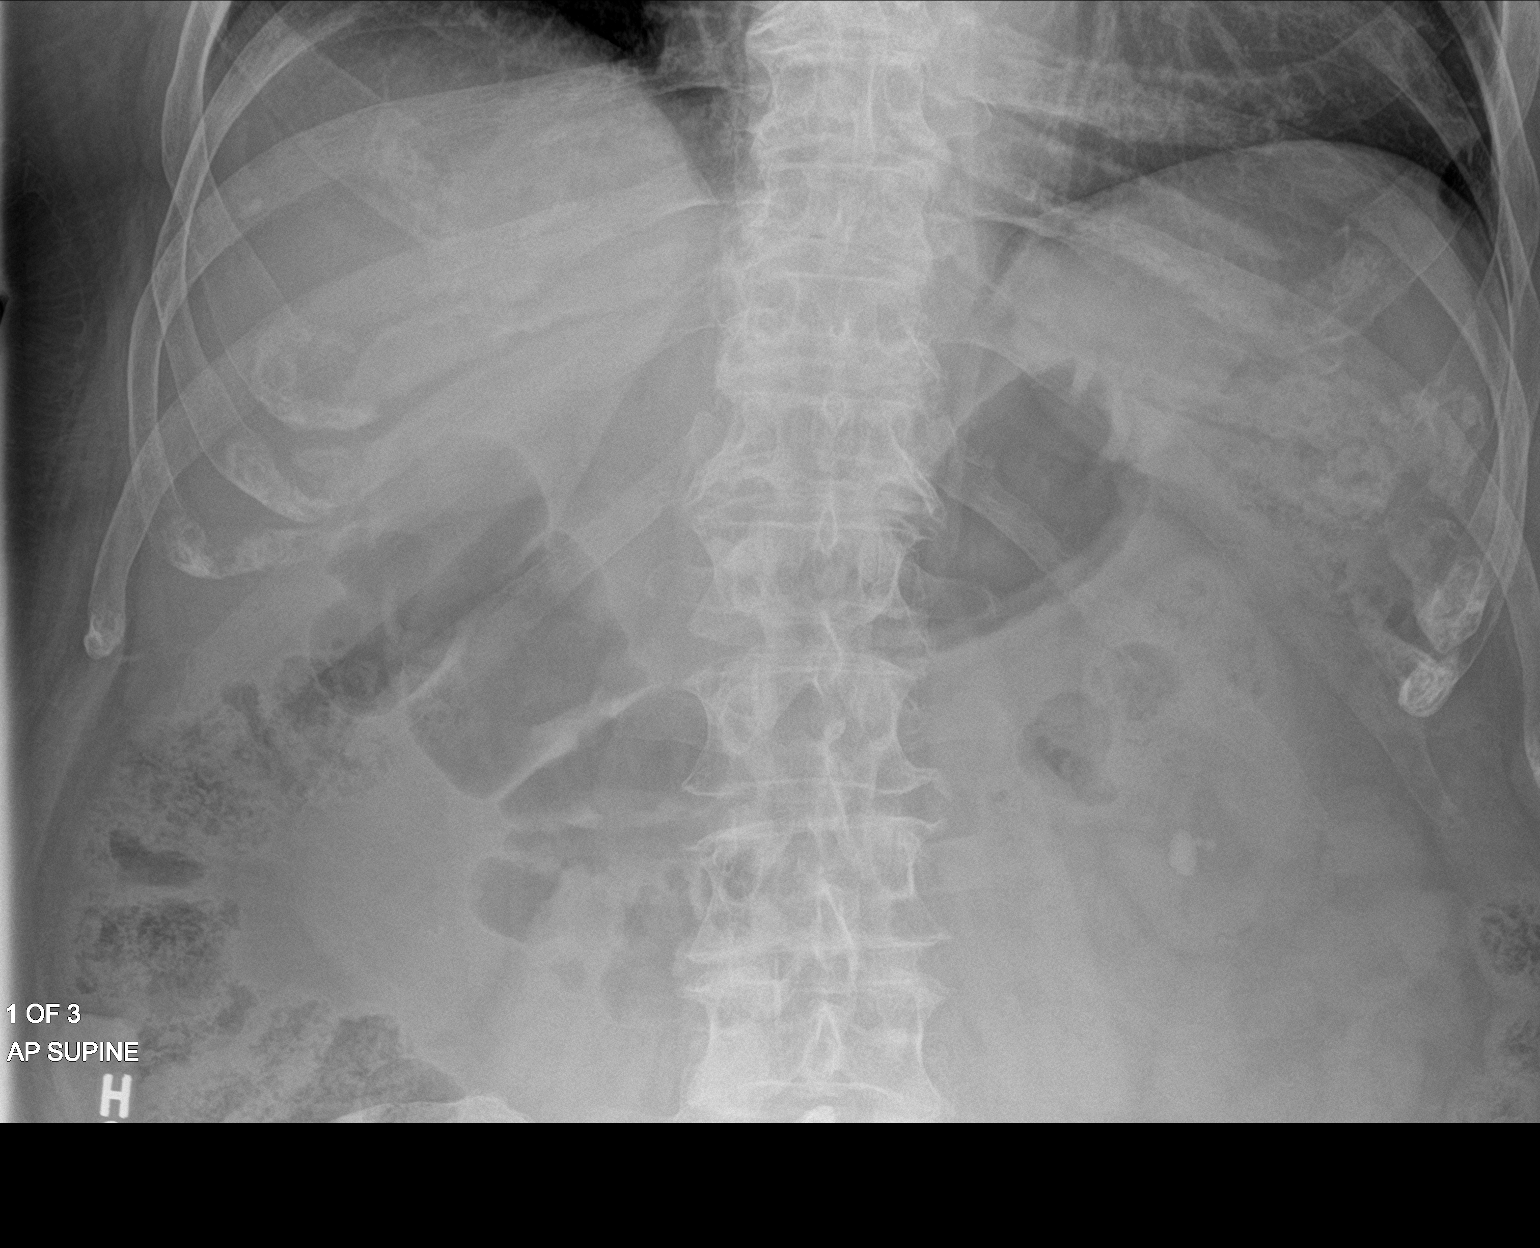

[abdomen kub (3 of 3)]
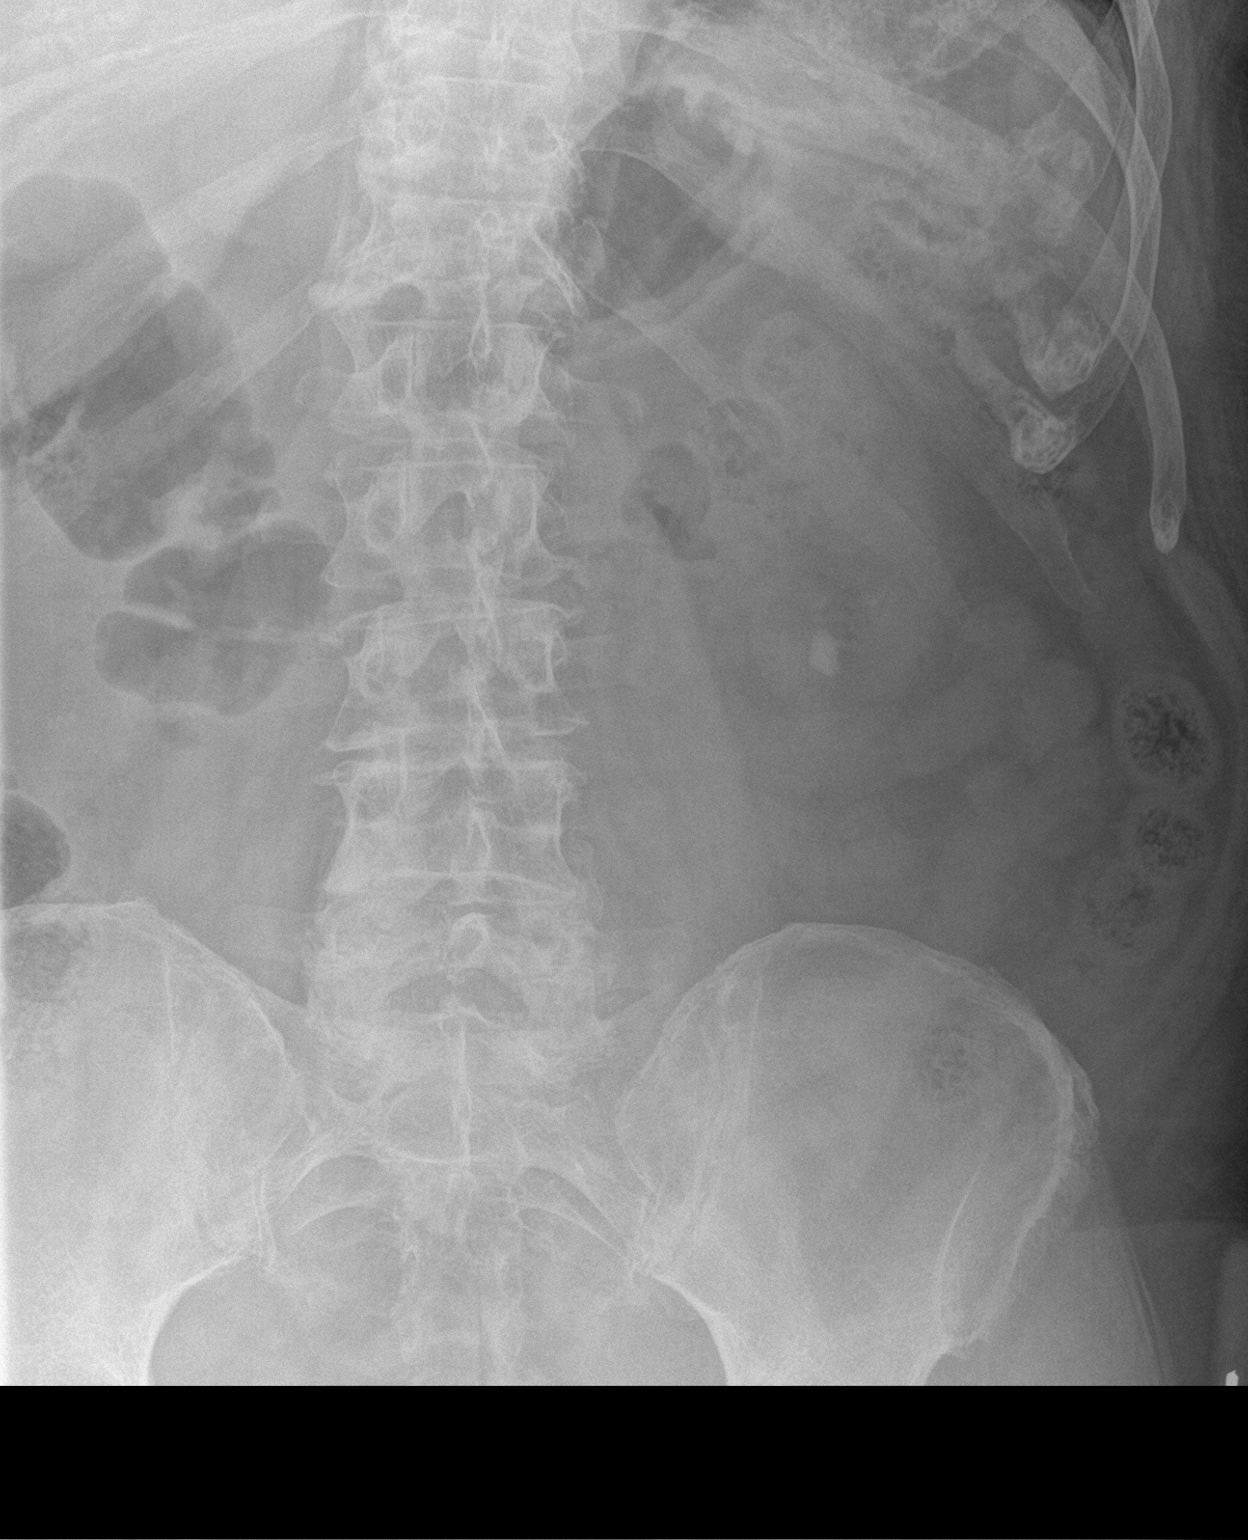

[3 of 3 positions shown; findings below may reference images not displayed]

FINDINGS: Stones are demonstrated, projected over the lower poles of both
kidneys. Largest is on the left, measuring 11 mm maximal diameter.
Similar size and position to previous study. No stones demonstrated
along the expected course of the ureters. Calcifications in the
pelvis are consistent with phleboliths. Tiny metallic foreign bodies
demonstrated over the low pelvis consistent with prostate seed
implants.

Scattered gas and stool in the colon. No small or large bowel
distention. Degenerative changes in the lumbar spine and hips. Soft
tissue contours appear intact.
IMPRESSION: Stones projected over the lower pole of both kidneys, largest on the
left measuring 11 mm. No change.

## 2022-07-01 MED ORDER — METOPROLOL TARTRATE 25 MG PO TABS: 12.5000 mg | ORAL_TABLET | Freq: Two times a day (BID) | ORAL | 3 refills | Status: DC

## 2022-07-01 NOTE — Patient Instructions (Addendum)
Medication Instructions:   DECREASE Lopressor to 12.5 mg twice a day  Labwork: None today  Testing/Procedures: Your physician has requested that you have an echocardiogram. Echocardiography is a painless test that uses sound waves to create images of your heart. It provides your doctor with information about the size and shape of your heart and how well your heart's chambers and valves are working. This procedure takes approximately one hour. There are no restrictions for this procedure. Please do NOT wear cologne, perfume, aftershave, or lotions (deodorant is allowed). Please arrive 15 minutes prior to your appointment time.   Follow-Up: 6 months  Any Other Special Instructions Will Be Listed Below (If Applicable).  If you need a refill on your cardiac medications before your next appointment, please call your pharmacy.

## 2022-07-01 NOTE — Addendum Note (Signed)
Addended by: Barbarann Ehlers A on: 07/01/2022 11:46 AM   Modules accepted: Orders

## 2022-07-02 LAB — T3, FREE: T3, Free: 2.9 pg/mL (ref 2.0–4.4)

## 2022-07-02 LAB — TSH: TSH: 1.32 u[IU]/mL (ref 0.450–4.500)

## 2022-07-02 LAB — T4, FREE: Free T4: 0.88 ng/dL (ref 0.82–1.77)

## 2022-07-06 ENCOUNTER — Ambulatory Visit (HOSPITAL_COMMUNITY)
Admission: RE | Admit: 2022-07-06 | Discharge: 2022-07-06 | Disposition: A | Discharge status: Home / Self Care | Payer: Medicare Other | Source: Ambulatory Visit | Attending: Cardiology | Admitting: Cardiology

## 2022-07-06 DIAGNOSIS — R0602 Shortness of breath: Secondary | ICD-10-CM | POA: Diagnosis not present

## 2022-07-06 LAB — ECHOCARDIOGRAM COMPLETE
AR max vel: 2.1 cm2
AV Area VTI: 2.15 cm2
AV Area mean vel: 2.08 cm2
AV Mean grad: 5.8 mmHg
AV Peak grad: 11.7 mmHg
Ao pk vel: 1.71 m/s
Area-P 1/2: 3.53 cm2
S' Lateral: 2.9 cm

## 2022-07-06 NOTE — Progress Notes (Signed)
*  PRELIMINARY RESULTS* Echocardiogram 2D Echocardiogram has been performed.  Daniel Schaefer 07/06/2022, 2:57 PM

## 2022-07-07 ENCOUNTER — Telehealth: Payer: Self-pay | Admitting: Cardiology

## 2022-07-07 NOTE — Telephone Encounter (Signed)
Results discussed with patient, pcp copied

## 2022-07-07 NOTE — Telephone Encounter (Signed)
12:13 pm, called patient right back,got voicemail

## 2022-07-07 NOTE — Telephone Encounter (Signed)
Patient was returning returning call but wont be available again till after 4:30. Please advise

## 2022-07-08 ENCOUNTER — Ambulatory Visit: Payer: Medicare Other | Admitting: "Endocrinology

## 2022-07-08 ENCOUNTER — Encounter: Payer: Self-pay | Admitting: "Endocrinology

## 2022-07-08 VITALS — BP 140/78 | HR 60 | Ht 70.5 in | Wt 231.2 lb

## 2022-07-08 DIAGNOSIS — I10 Essential (primary) hypertension: Secondary | ICD-10-CM | POA: Diagnosis not present

## 2022-07-08 DIAGNOSIS — E1159 Type 2 diabetes mellitus with other circulatory complications: Secondary | ICD-10-CM | POA: Diagnosis not present

## 2022-07-08 DIAGNOSIS — Z794 Long term (current) use of insulin: Secondary | ICD-10-CM | POA: Diagnosis not present

## 2022-07-08 DIAGNOSIS — E782 Mixed hyperlipidemia: Secondary | ICD-10-CM

## 2022-07-08 MED ORDER — TOUJEO MAX SOLOSTAR 300 UNIT/ML ~~LOC~~ SOPN: 64.0000 [IU] | PEN_INJECTOR | Freq: Every day | SUBCUTANEOUS | 2 refills | Status: DC

## 2022-07-08 MED ORDER — METFORMIN HCL 500 MG PO TABS: 500.0000 mg | ORAL_TABLET | Freq: Two times a day (BID) | ORAL | 1 refills | Status: DC

## 2022-07-08 NOTE — Progress Notes (Signed)
07/08/2022  Endocrinology follow-up note   Subjective:    Patient ID: Daniel Schaefer, male    DOB: [birth year],    Past Medical History:  Diagnosis Date   Atrial fibrillation [facility])    Reportedly diagnosed November 2011   Essential hypertension    History of kidney stones    Prostate cancer [facility])    Radiation implants   Type 2 diabetes mellitus ([place])    Past Surgical History:  Procedure Laterality Date   Gold seed placement     INGUINAL HERNIA REPAIR Left 07/02/2020   Procedure: LEFT INGUINAL HERNIORRHAPY WITH MESH;  Surgeon: Aviva Signs, MD;  Location: [facility];  Service: General;  Laterality: Left;   LITHOTRIPSY     PROSTATE BIOPSY     TRIGGER FINGER RELEASE Left    ring finger   URETEROSCOPY WITH HOLMIUM LASER LITHOTRIPSY     Social History   Socioeconomic History   Marital status: Divorced    Spouse name: Not on file   Number of children: Not on file   Years of education: Not on file   Highest education level: Not on file  Occupational History   Not on file  Tobacco Use   Smoking status: Never   Smokeless tobacco: Never  Vaping Use   Vaping Use: Never used  Substance and Sexual Activity   Alcohol use: No    Alcohol/week: 0.0 standard drinks of alcohol   Drug use: No   Sexual activity: Yes  Other Topics Concern   Not on file  Social History Narrative   Not on file   Social Determinants of Health   Financial Resource Strain: Not on file  Food Insecurity: Not on file  Transportation Needs: Not on file  Physical Activity: Not on file  Stress: Not on file  Social Connections: Not on file   Outpatient Encounter Medications as of 07/08/2022  Medication Sig   insulin glargine, 2 Unit Dial, (TOUJEO MAX SOLOSTAR) 300 UNIT/ML Solostar Pen Inject 64 Units into the skin at bedtime.   ACCU-CHEK GUIDE test strip USE 1 STRIP TO CHECK GLUCOSE THREE TIMES DAILY   amLODipine (NORVASC) 5 MG tablet Take 1 tablet by mouth once daily   Azelastine HCl 137 MCG/SPRAY  SOLN Place 2 sprays into both nostrils daily.   Blood Glucose Monitoring Suppl (ACCU-CHEK GUIDE ME) w/Device KIT 1 Piece by Does not apply route as directed.   Cholecalciferol (VITAMIN D3) 125 MCG (5000 UT) TABS Take 5,000 Units by mouth daily.    Flaxseed, Linseed, (FLAX SEEDS PO) Take by mouth.   furosemide (LASIX) 20 MG tablet Take 1 tablet by mouth once daily   GLOBAL EASE INJECT PEN NEEDLES 31G X 8 MM MISC USE 1 DAILY AS DIRECTED - RUN ON CASH $15 - PER PC.   Insulin Pen Needle (B-D ULTRAFINE III SHORT PEN) 31G X 8 MM MISC 1 each by Does not apply route as directed.   Lancets MISC 1 each by Does not apply route as directed.   levocetirizine (XYZAL) 5 MG tablet SMARTSIG:1 Tablet(s) By Mouth Every Evening   lisinopril (ZESTRIL) 20 MG tablet Take 1 tablet by mouth once daily   loratadine (CLARITIN) 10 MG tablet Take 10 mg by mouth daily.   melatonin 5 MG TABS Take 5 mg by mouth.   metFORMIN (GLUCOPHAGE) 500 MG tablet Take 1 tablet (500 mg total) by mouth 2 (two) times daily after a meal.   metoprolol tartrate (LOPRESSOR) 25 MG tablet Take 0.5 tablets (12.5  mg total) by mouth 2 (two) times daily.   omeprazole (PRILOSEC) 20 MG capsule Take 20 mg by mouth daily.    oxymetazoline (AFRIN) 0.05 % nasal spray Place 1 spray into both nostrils 2 (two) times daily as needed for congestion.   potassium chloride (KLOR-CON) 10 MEQ tablet TAKE 1 TABLET BY MOUTH DAILY   pravastatin (PRAVACHOL) 40 MG tablet TAKE 1 TABLET BY MOUTH AT BEDTIME   tadalafil (CIALIS) 20 MG tablet Take 20 mg by mouth daily as needed for erectile dysfunction.   tamsulosin (FLOMAX) 0.4 MG CAPS capsule Take 1 capsule (0.4 mg total) by mouth daily.   warfarin (COUMADIN) 5 MG tablet TAKE 1 TABLET BY MOUTH ONCE DAILY EXCEPT  1/2  TABLET  ON  WEDNESDAYS  OR  AS  DIRECTED   [DISCONTINUED] Insulin Glargine (BASAGLAR KWIKPEN) 100 UNIT/ML INJECT 60 UNITS INTO THE SKIN AT BEDTIME (Patient taking differently: 64 Units at bedtime. INJECT 64 UNITS  INTO THE SKIN AT BEDTIME)   [DISCONTINUED] metFORMIN (GLUCOPHAGE) 500 MG tablet TAKE 1 TABLET BY MOUTH ONCE DAILY AFTER SUPPER   No facility-administered encounter medications on file as of 07/08/2022.   ALLERGIES: Allergies  Allergen Reactions   Bactrim [Sulfamethoxazole-Trimethoprim] Other (See Comments)    Thin his blood really bad   Other     Cannot mix warfarin and bactrim    [name] Flextouch [Insulin Degludec] Rash   VACCINATION STATUS: Immunization History  Administered Date(s) Administered   [employer] Vaccination 10/04/2019, 11/05/2019    Diabetes He presents for his follow-up diabetic visit. He has type 2 diabetes mellitus. Onset time: He was diagnosed at approximate age of 45 years. His disease course has been stable. There are no hypoglycemic associated symptoms. There are no diabetic associated symptoms. There are no hypoglycemic complications. Symptoms are stable. Diabetic complications include nephropathy. Risk factors for coronary artery disease include hypertension, dyslipidemia, sedentary lifestyle, male sex and diabetes mellitus. Current diabetic treatment includes oral agent (monotherapy) and insulin injections. He is compliant with treatment most of the time. His weight is fluctuating minimally. He is following a generally unhealthy diet. When asked about meal planning, he reported none. He has not had a previous visit with a dietitian. He rarely participates in exercise. His home blood glucose trend is fluctuating minimally. His breakfast blood glucose range is generally 130-140 mg/dl. His bedtime blood glucose range is generally 180-200 mg/dl. His overall blood glucose range is 180-200 mg/dl. (He with his logs and meter showing average blood glucose between 140-165 mg/day.  His point-of-care A1c is 7.4%, unchanged from his last time.  No hypoglycemia was documented.   ) An ACE inhibitor/angiotensin II receptor blocker is being taken. He does not see a  podiatrist.Eye exam is current.  Hypertension This is a chronic problem. The current episode started more than 1 year ago. The problem is unchanged. The problem is controlled. There are no associated agents to hypertension. Risk factors for coronary artery disease include diabetes mellitus, dyslipidemia, male gender and sedentary lifestyle. Past treatments include ACE inhibitors, calcium channel blockers, diuretics and beta blockers. The current treatment provides moderate improvement. There are no compliance problems.  Hypertensive end-organ damage includes kidney disease. Identifiable causes of hypertension include chronic renal disease.  Hyperlipidemia This is a chronic problem. The current episode started more than 1 year ago. The problem is controlled. Recent lipid tests were reviewed and are normal. Exacerbating diseases include chronic renal disease and diabetes. Factors aggravating his hyperlipidemia include beta blockers. Current antihyperlipidemic treatment includes  statins. The current treatment provides moderate improvement of lipids. There are no compliance problems.  Risk factors for coronary artery disease include dyslipidemia, diabetes mellitus, hypertension, male sex and a sedentary lifestyle.    Review of systems  Constitutional: + Minimally fluctuating body weight,  current Body mass index is 32.7 kg/m. , no fatigue, no subjective hyperthermia, no subjective hypothermia    Objective:    BP (!) 140/78   Pulse 60   Ht 5' 10.5" (1.791 m)   Wt 231 lb 3.2 oz (104.9 kg)   BMI 32.70 kg/m   Wt Readings from Last 3 Encounters:  07/08/22 231 lb 3.2 oz (104.9 kg)  07/01/22 232 lb (105.2 kg)  04/05/22 233 lb (105.7 kg)    BP Readings from Last 3 Encounters:  07/08/22 (!) 140/78  07/01/22 138/80  04/05/22 (!) 142/86     Physical Exam- Limited  Constitutional:  Body mass index is 32.7 kg/m. , not in acute distress, normal state of mind Eyes:  EOMI, no  exophthalmos     Results for orders placed or performed during the hospital encounter of 07/06/22  ECHOCARDIOGRAM COMPLETE  Result Value Ref Range   Area-P 1/2 3.53 cm2   S' Lateral 2.90 cm   AR max vel 2.10 cm2   AV Area mean vel 2.08 cm2   AV Area VTI 2.15 cm2   AV Peak grad 11.7 mmHg   Ao pk vel 1.71 m/s   AV Mean grad 5.8 mmHg   Complete Blood Count (Most recent): Lab Results  Component Value Date   WBC 6.6 08/31/2013   HGB 14.0 08/31/2013   HCT 40.1 08/31/2013   MCV 75.9 (L) 08/31/2013   PLT 235 08/31/2013   Chemistry (most recent): Lab Results  Component Value Date   NA 139 03/29/2022   K 3.9 03/29/2022   CL 104 03/29/2022   CO2 30 (A) 03/29/2022   BUN 19 06/01/2022   CREATININE 1.4 (A) 06/01/2022   Lipid Panel     Component Value Date/Time   CHOL 149 03/29/2022 0000   TRIG 125 03/29/2022 0000   HDL 44 03/29/2022 0000   LDLCALC 80 03/29/2022 0000    Assessment & Plan:   1) Type 2 diabetes mellitus with stage 3 renal insufficiency, with long-term current use of insulin (HCC)  He with his logs and meter showing average blood glucose between 140-165 mg/day.  His point-of-care A1c is 7.4%, unchanged from his last time.  No hypoglycemia was documented.    -Patient remains at a high risk for more acute and chronic complications of diabetes which include CAD, CVA, CKD, retinopathy, and neuropathy. These are all discussed in detail with the patient.  -Recent labs reviewed showing improved renal function.  - Nutritional counseling repeated at each appointment due to patients tendency to fall back in to old habits.  - he acknowledges that there is a room for improvement in his food and drink choices. - Suggestion is made for him to avoid simple carbohydrates  from his diet including Cakes, Sweet Desserts, Ice Cream, Soda (diet and regular), Sweet Tea, Candies, Chips, Cookies, Store Bought Juices, Alcohol , Artificial Sweeteners,  Coffee Creamer, and "Sugar-free"  Products, Lemonade. This will help patient to have more stable blood glucose profile and potentially avoid unintended weight gain.    The following Lifestyle Medicine recommendations according to [place]  [facility]) were discussed and and offered to patient and he  agrees to start the journey:  A. Whole Foods, Plant-Based Nutrition comprising of fruits and vegetables, plant-based proteins, whole-grain carbohydrates was discussed in detail with the patient.   A list for source of those nutrients were also provided to the patient.  Patient will use only water or unsweetened tea for hydration. B.  The need to stay away from risky substances including alcohol, smoking; obtaining 7 to 9 hours of restorative sleep, at least 150 minutes of moderate intensity exercise weekly, the importance of healthy social    -He is following with Jearld Fenton, CDE for diabetes education.  - Based on his glycemic profile he is advised to increase his Basaglar to 64 units nightly, associated with monitoring of blood glucose twice a day-daily before breakfast and at bedtime.    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     He worries about cost of medications, previously declined offer for Trulicity/Ozempic weekly injections.   He has sulfa allergy, not a candidate for glipizide.  He will continue to benefit from low-dose metformin.  Extended release metformin is not covered by his insurance.  He will be switched to regular strength metformin 500 mg p.o. twice daily. -He is encouraged to continue monitoring blood glucose twice daily, before breakfast and before bed, and to call the clinic if he has readings less than 70 or greater than 150 mg per DL 3 days in a week at fasting.    - He is generally hesitant to add medications.  - Patient specific target  for A1c; LDL, HDL, Triglycerides, and   Waist Circumference were discussed in detail.  2) BP/HTN: His blood pressure is controlled to near target.  He is advised to continue Norvasc 5 mg po daily, Lasix 20 mg po daily, Lisinopril 20 mg po daily, and Metoprolol 25 mg po twice daily.  3) Lipids/HPL:  His most recent lipid panel from 03/24/20 shows controlled LDL at 80.    He is advised to continue pravastatin 40 mg p.o. daily at bedtime.   Side effects and precautions discussed with him.  He recently had his labs drawn at his PCP, will request copy.  4)  Weight/Diet:  His Body mass index is 32.7 kg/m.-candidate for some weight loss.  He is following with Jearld Fenton, CD for diabetes education.  No success in weight loss,  exercise, and carbohydrates information provided.  5) Chronic Care/Health Maintenance: -Patient on ACEI and Statin medications and encouraged to continue to follow up with Ophthalmology, Podiatrist at least yearly or according to recommendations, and advised to stay away from smoking. I have recommended yearly flu vaccine and pneumonia vaccination at least every 5 years; moderate intensity exercise for up to 150 minutes weekly; and  sleep for at least 7 hours a day.   He is advised to maintain close follow-up with his PMD Dr. Huel Cote.  I spent 33 minutes in the care of the patient today including review of labs from [place], Lipids, Thyroid Function, Hematology (current and previous including abstractions from other facilities); face-to-face time discussing  his blood glucose readings/logs, discussing hypoglycemia and hyperglycemia episodes and symptoms, medications doses, his options of short and long term treatment based on the latest standards of care / guidelines;  discussion about incorporating lifestyle medicine;  and documenting the encounter. Risk reduction counseling performed per USPSTF guidelines to reduce  obesity and cardiovascular risk factors.     Please refer to Patient Instructions for Blood  Glucose  Monitoring and Insulin/Medications Dosing Guide"  in media tab for additional information. Please  also refer to " Patient Self Inventory" in the Media  tab for reviewed elements of pertinent patient history.  Daniel Schaefer participated in the discussions, expressed understanding, and voiced agreement with the above plans.  All questions were answered to his satisfaction. he is encouraged to contact clinic should he have any questions or concerns prior to his return visit.    Follow up plan: Return in about 4 months (around 11/08/2022) for Bring Meter/CGM Device/Logs- A1c in Office.  Rayetta Pigg, [facility] [address] [place], [place] [address] Phone: [phone] Fax: [phone]  07/08/2022, 1:59 PM

## 2022-07-08 NOTE — Patient Instructions (Signed)
                                     Advice for Weight Management  -For most of us the best way to lose weight is by diet management. Generally speaking, diet management means consuming less calories intentionally which over time brings about progressive weight loss.  This can be achieved more effectively by avoiding ultra processed carbohydrates, processed meats, unhealthy fats.    It is critically important to know your numbers: how much calorie you are consuming and how much calorie you need. More importantly, our carbohydrates sources should be unprocessed naturally occurring  complex starch food items.  It is always important to balance nutrition also by  appropriate intake of proteins (mainly plant-based), healthy fats/oils, plenty of fruits and vegetables.   -The American College of Lifestyle Medicine (ACL M) recommends nutrition derived mostly from Whole Food, Plant Predominant Sources example an apple instead of applesauce or apple pie. Eat Plenty of vegetables, Mushrooms, fruits, Legumes, Whole Grains, Nuts, seeds in lieu of processed meats, processed snacks/pastries red meat, poultry, eggs.  Use only water or unsweetened tea for hydration.  The College also recommends the need to stay away from risky substances including alcohol, smoking; obtaining 7-9 hours of restorative sleep, at least 150 minutes of moderate intensity exercise weekly, importance of healthy social connections, and being mindful of stress and seek help when it is overwhelming.    -Sticking to a routine mealtime to eat 3 meals a day and avoiding unnecessary snacks is shown to have a big role in weight control. Under normal circumstances, the only time we burn stored energy is when we are hungry, so allow  some hunger to take place- hunger means no food between appropriate meal times, only water.  It is not advisable to starve.   -It is better to avoid simple carbohydrates including:  Cakes, Sweet Desserts, Ice Cream, Soda (diet and regular), Sweet Tea, Candies, Chips, Cookies, Store Bought Juices, Alcohol in Excess of  1-2 drinks a day, Lemonade,  Artificial Sweeteners, Doughnuts, Coffee Creamers, "Sugar-free" Products, etc, etc.  This is not a complete list.....    -Consulting with certified diabetes educators is proven to provide you with the most accurate and current information on diet.  Also, you may be  interested in discussing diet options/exchanges , we can schedule a visit with Daniel Schaefer, RDN, CDE for individualized nutrition education.  -Exercise: If you are able: 30 -60 minutes a day ,4 days a week, or 150 minutes of moderate intensity exercise weekly.    The longer the better if tolerated.  Combine stretch, strength, and aerobic activities.  If you were told in the past that you have high risk for cardiovascular diseases, or if you are currently symptomatic, you may seek evaluation by your heart doctor prior to initiating moderate to intense exercise programs.                                  Additional Care Considerations for Diabetes/Prediabetes   -Diabetes  is a chronic disease.  The most important care consideration is regular follow-up with your diabetes care provider with the goal being avoiding or delaying its complications and to take advantage of advances in medications and technology.  If appropriate actions are taken early enough, type 2 diabetes can even be   reversed.  Seek information from the right source.  - Whole Food, Plant Predominant Nutrition is highly recommended: Eat Plenty of vegetables, Mushrooms, fruits, Legumes, Whole Grains, Nuts, seeds in lieu of processed meats, processed snacks/pastries red meat, poultry, eggs as recommended by American College of  Lifestyle Medicine (ACLM).  -Type 2 diabetes is known to coexist with other important comorbidities such as high blood pressure and high cholesterol.  It is critical to control not only the  diabetes but also the high blood pressure and high cholesterol to minimize and delay the risk of complications including coronary artery disease, stroke, amputations, blindness, etc.  The good news is that this diet recommendation for type 2 diabetes is also very helpful for managing high cholesterol and high blood blood pressure.  - Studies showed that people with diabetes will benefit from a class of medications known as ACE inhibitors and statins.  Unless there are specific reasons not to be on these medications, the standard of care is to consider getting one from these groups of medications at an optimal doses.  These medications are generally considered safe and proven to help protect the heart and the kidneys.    - People with diabetes are encouraged to initiate and maintain regular follow-up with eye doctors, foot doctors, dentists , and if necessary heart and kidney doctors.     - It is highly recommended that people with diabetes quit smoking or stay away from smoking, and get yearly  flu vaccine and pneumonia vaccine at least every 5 years.  See above for additional recommendations on exercise, sleep, stress management , and healthy social connections.      

## 2022-07-13 ENCOUNTER — Other Ambulatory Visit: Payer: Self-pay | Admitting: Cardiology

## 2022-07-13 NOTE — Telephone Encounter (Signed)
Refill request for warfarin:  Last INR was 2.5 on 06/29/22 Next INR due 08/10/22 LOV was 07/01/22  Myles Gip MD  Refill approved.

## 2022-07-27 DIAGNOSIS — Z23 Encounter for immunization: Secondary | ICD-10-CM | POA: Diagnosis not present

## 2022-08-06 ENCOUNTER — Other Ambulatory Visit: Payer: Self-pay | Admitting: Nurse Practitioner

## 2022-08-06 ENCOUNTER — Other Ambulatory Visit: Payer: Self-pay | Admitting: "Endocrinology

## 2022-08-10 ENCOUNTER — Ambulatory Visit: Payer: Medicare Other | Attending: Cardiology | Admitting: *Deleted

## 2022-08-10 DIAGNOSIS — I4821 Permanent atrial fibrillation: Secondary | ICD-10-CM

## 2022-08-10 DIAGNOSIS — Z5181 Encounter for therapeutic drug level monitoring: Secondary | ICD-10-CM | POA: Diagnosis not present

## 2022-08-10 LAB — POCT INR: INR: 2.9 (ref 2.0–3.0)

## 2022-08-10 NOTE — Patient Instructions (Signed)
Continue warfarin 1 tablet daily except 1/2 tablet on Wednesdays.   Continue greens Recheck in 6 weeks  

## 2022-08-17 ENCOUNTER — Other Ambulatory Visit: Payer: Self-pay | Admitting: Cardiology

## 2022-08-24 DIAGNOSIS — E1169 Type 2 diabetes mellitus with other specified complication: Secondary | ICD-10-CM | POA: Diagnosis not present

## 2022-08-31 DIAGNOSIS — I10 Essential (primary) hypertension: Secondary | ICD-10-CM | POA: Diagnosis not present

## 2022-08-31 DIAGNOSIS — E1169 Type 2 diabetes mellitus with other specified complication: Secondary | ICD-10-CM | POA: Diagnosis not present

## 2022-08-31 DIAGNOSIS — I509 Heart failure, unspecified: Secondary | ICD-10-CM | POA: Diagnosis not present

## 2022-08-31 DIAGNOSIS — I4891 Unspecified atrial fibrillation: Secondary | ICD-10-CM | POA: Diagnosis not present

## 2022-09-08 ENCOUNTER — Other Ambulatory Visit: Payer: Self-pay | Admitting: "Endocrinology

## 2022-09-09 DIAGNOSIS — Z23 Encounter for immunization: Secondary | ICD-10-CM | POA: Diagnosis not present

## 2022-09-21 ENCOUNTER — Ambulatory Visit: Payer: Medicare Other | Attending: Cardiology | Admitting: *Deleted

## 2022-09-21 DIAGNOSIS — I4821 Permanent atrial fibrillation: Secondary | ICD-10-CM

## 2022-09-21 DIAGNOSIS — Z5181 Encounter for therapeutic drug level monitoring: Secondary | ICD-10-CM | POA: Diagnosis not present

## 2022-09-21 LAB — POCT INR: INR: 2.5 (ref 2.0–3.0)

## 2022-09-21 NOTE — Patient Instructions (Signed)
Continue warfarin 1 tablet daily except 1/2 tablet on Wednesdays.   Continue greens Recheck in 6 weeks  

## 2022-09-27 ENCOUNTER — Emergency Department (HOSPITAL_COMMUNITY): Payer: Medicare Other

## 2022-09-27 ENCOUNTER — Other Ambulatory Visit: Payer: Self-pay

## 2022-09-27 ENCOUNTER — Encounter (HOSPITAL_COMMUNITY): Payer: Self-pay | Admitting: Emergency Medicine

## 2022-09-27 ENCOUNTER — Emergency Department (HOSPITAL_COMMUNITY)
Admission: EM | Admit: 2022-09-27 | Discharge: 2022-09-27 | Disposition: A | Discharge status: Home / Self Care | Payer: Medicare Other | Attending: Emergency Medicine | Admitting: Emergency Medicine

## 2022-09-27 DIAGNOSIS — I509 Heart failure, unspecified: Secondary | ICD-10-CM | POA: Insufficient documentation

## 2022-09-27 DIAGNOSIS — K429 Umbilical hernia without obstruction or gangrene: Secondary | ICD-10-CM | POA: Diagnosis not present

## 2022-09-27 DIAGNOSIS — Z1152 Encounter for screening for COVID-19: Secondary | ICD-10-CM | POA: Insufficient documentation

## 2022-09-27 DIAGNOSIS — E119 Type 2 diabetes mellitus without complications: Secondary | ICD-10-CM | POA: Diagnosis not present

## 2022-09-27 DIAGNOSIS — Z7901 Long term (current) use of anticoagulants: Secondary | ICD-10-CM | POA: Diagnosis not present

## 2022-09-27 DIAGNOSIS — I110 Hypertensive heart disease with heart failure: Secondary | ICD-10-CM | POA: Insufficient documentation

## 2022-09-27 DIAGNOSIS — Z794 Long term (current) use of insulin: Secondary | ICD-10-CM | POA: Diagnosis not present

## 2022-09-27 DIAGNOSIS — K8020 Calculus of gallbladder without cholecystitis without obstruction: Secondary | ICD-10-CM | POA: Diagnosis not present

## 2022-09-27 DIAGNOSIS — Z79899 Other long term (current) drug therapy: Secondary | ICD-10-CM | POA: Diagnosis not present

## 2022-09-27 DIAGNOSIS — I700 Atherosclerosis of aorta: Secondary | ICD-10-CM | POA: Diagnosis not present

## 2022-09-27 DIAGNOSIS — Z7984 Long term (current) use of oral hypoglycemic drugs: Secondary | ICD-10-CM | POA: Insufficient documentation

## 2022-09-27 DIAGNOSIS — K4090 Unilateral inguinal hernia, without obstruction or gangrene, not specified as recurrent: Secondary | ICD-10-CM | POA: Diagnosis not present

## 2022-09-27 DIAGNOSIS — J189 Pneumonia, unspecified organism: Secondary | ICD-10-CM | POA: Diagnosis not present

## 2022-09-27 DIAGNOSIS — N200 Calculus of kidney: Secondary | ICD-10-CM | POA: Insufficient documentation

## 2022-09-27 DIAGNOSIS — J90 Pleural effusion, not elsewhere classified: Secondary | ICD-10-CM | POA: Diagnosis not present

## 2022-09-27 DIAGNOSIS — R079 Chest pain, unspecified: Secondary | ICD-10-CM | POA: Diagnosis not present

## 2022-09-27 LAB — PROTIME-INR
INR: 2.5 — ABNORMAL HIGH (ref 0.8–1.2)
Prothrombin Time: 26.8 seconds — ABNORMAL HIGH (ref 11.4–15.2)

## 2022-09-27 LAB — BASIC METABOLIC PANEL
Anion gap: 10 (ref 5–15)
BUN: 23 mg/dL (ref 8–23)
CO2: 25 mmol/L (ref 22–32)
Calcium: 8.3 mg/dL — ABNORMAL LOW (ref 8.9–10.3)
Chloride: 99 mmol/L (ref 98–111)
Creatinine, Ser: 1.39 mg/dL — ABNORMAL HIGH (ref 0.61–1.24)
GFR, Estimated: 54 mL/min — ABNORMAL LOW (ref >60)
Glucose, Bld: 129 mg/dL — ABNORMAL HIGH (ref 70–99)
Potassium: 3.6 mmol/L (ref 3.5–5.1)
Sodium: 134 mmol/L — ABNORMAL LOW (ref 135–145)

## 2022-09-27 LAB — CBC WITH DIFFERENTIAL/PLATELET
Abs Immature Granulocytes: 0.05 10*3/uL (ref 0.00–0.07)
Basophils Absolute: 0.1 10*3/uL (ref 0.0–0.1)
Basophils Relative: 1 %
Eosinophils Absolute: 0.2 10*3/uL (ref 0.0–0.5)
Eosinophils Relative: 2 %
HCT: 38.5 % — ABNORMAL LOW (ref 39.0–52.0)
Hemoglobin: 11.9 g/dL — ABNORMAL LOW (ref 13.0–17.0)
Immature Granulocytes: 1 %
Lymphocytes Relative: 12 %
Lymphs Abs: 1.2 10*3/uL (ref 0.7–4.0)
MCH: 23.6 pg — ABNORMAL LOW (ref 26.0–34.0)
MCHC: 30.9 g/dL (ref 30.0–36.0)
MCV: 76.4 fL — ABNORMAL LOW (ref 80.0–100.0)
Monocytes Absolute: 1 10*3/uL (ref 0.1–1.0)
Monocytes Relative: 9 %
Neutro Abs: 7.8 10*3/uL — ABNORMAL HIGH (ref 1.7–7.7)
Neutrophils Relative %: 75 %
Platelets: 269 10*3/uL (ref 150–400)
RBC: 5.04 MIL/uL (ref 4.22–5.81)
RDW: 15.3 % (ref 11.5–15.5)
WBC: 10.2 10*3/uL (ref 4.0–10.5)
nRBC: 0 % (ref 0.0–0.2)

## 2022-09-27 LAB — URINALYSIS, ROUTINE W REFLEX MICROSCOPIC
Bilirubin Urine: NEGATIVE
Glucose, UA: 150 mg/dL — AB
Ketones, ur: NEGATIVE mg/dL
Leukocytes,Ua: NEGATIVE
Nitrite: NEGATIVE
Protein, ur: 100 mg/dL — AB
Specific Gravity, Urine: 1.016 (ref 1.005–1.030)
pH: 5 (ref 5.0–8.0)

## 2022-09-27 LAB — RESP PANEL BY RT-PCR (RSV, FLU A&B, COVID)  RVPGX2
Influenza A by PCR: NEGATIVE
Influenza B by PCR: NEGATIVE
Resp Syncytial Virus by PCR: NEGATIVE
SARS Coronavirus 2 by RT PCR: NEGATIVE

## 2022-09-27 LAB — LACTIC ACID, PLASMA
Lactic Acid, Venous: 0.9 mmol/L (ref 0.5–1.9)
Lactic Acid, Venous: 0.8 mmol/L (ref 0.5–1.9)

## 2022-09-27 MED ORDER — LIDOCAINE HCL (PF) 1 % IJ SOLN
INTRAMUSCULAR | Status: AC
  Filled 2022-09-27: qty 5

## 2022-09-27 MED ORDER — AZITHROMYCIN 250 MG PO TABS: ORAL_TABLET | ORAL | 0 refills | Status: DC

## 2022-09-27 MED ORDER — CEFTRIAXONE SODIUM 1 G IJ SOLR
2.0000 g | Freq: Once | INTRAMUSCULAR | Status: AC
  Administered 2022-09-27: 2 g via INTRAMUSCULAR
  Filled 2022-09-27: qty 20

## 2022-09-27 MED ORDER — ACETAMINOPHEN 325 MG PO TABS
650.0000 mg | ORAL_TABLET | Freq: Once | ORAL | Status: AC
  Administered 2022-09-27: 650 mg via ORAL
  Filled 2022-09-27: qty 2

## 2022-09-27 NOTE — ED Triage Notes (Signed)
Pt presents with left flank pain, since last Thursday, PMH of kidney stones.

## 2022-09-27 NOTE — Discharge Instructions (Signed)
Your workup this evening shows that you have left-sided pneumonia.  This is what is likely causing your pain.  Please take the antibiotic as directed until finished.  You may take Tylenol if needed for fever and/or pain.  Please follow-up with your primary care provider later this week for recheck.  Return to the emergency department for any new or worsening symptoms.

## 2022-09-27 NOTE — ED Provider Triage Note (Signed)
Emergency Medicine Provider Triage Evaluation Note  MAREK NGHIEM , a 72 y.o. male  was evaluated in triage.  Pt complains of left flank pain for the last 4 days.  Review of Systems  Positive: History of kidney stones, has had a cough Negative: Fever  Physical Exam  BP (!) 153/80   Pulse 73   Temp 98.3 F (36.8 C) (Oral)   Resp 16   Ht '5\' 10"'$  (1.778 m)   Wt 103.4 kg   SpO2 96%   BMI 32.71 kg/m  Gen:   Awake, no distress   Resp:  Normal effort  MSK:   Moves extremities without difficulty  Other:    Medical Decision Making  Medically screening exam initiated at 2:28 PM.  Appropriate orders placed.  CRISTEN MURCIA was informed that the remainder of the evaluation will be completed by another provider, this initial triage assessment does not replace that evaluation, and the importance of remaining in the ED until their evaluation is complete.     Hayden Rasmussen, MD 09/27/22 [phone]

## 2022-09-30 NOTE — ED Provider Notes (Signed)
[facility] EMERGENCY DEPARTMENT Provider Note   CSN: [id number] Arrival date & time: 09/27/22  1132     History  Chief Complaint  Patient presents with   Flank Pain    Daniel Schaefer is a 72 y.o. male.   Flank Pain Pertinent negatives include no chest pain, no abdominal pain, no headaches and no shortness of breath.       Daniel Schaefer is a 72 y.o. male with hx of A fib, DM, HTN, heart failure who presents to the Emergency Department complaining of left posterior chest pain x 5 days.  Describes pain associated with movement.  Denies known injury.  He endorses occasional cough, but denies chest pain or shortness of breath.  He also reports hx of prior kidney stones and concerned he has another one.  He denies any dysuria or hematuria.  Pain does not radiate  Home Medications Prior to Admission medications   Medication Sig Start Date End Date Taking? Authorizing Provider  azithromycin (ZITHROMAX) 250 MG tablet Take 1 tablet daily x 5 days 09/27/22  Yes Nickolaus Bordelon, PA-C  amLODipine (NORVASC) 5 MG tablet Take 1 tablet by mouth once daily 02/19/22   Satira Sark, MD  Azelastine HCl 137 MCG/SPRAY SOLN Place 2 sprays into both nostrils daily. 02/26/21   [provider]  Blood Glucose Monitoring Suppl (ACCU-CHEK GUIDE ME) w/Device KIT 1 Piece by Does not apply route as directed. 10/05/21   Cassandria Anger, MD  Cholecalciferol (VITAMIN D3) 125 MCG (5000 UT) TABS Take 5,000 Units by mouth daily.     [provider]  Flaxseed, Linseed, (FLAX SEEDS PO) Take by mouth.    [provider]  furosemide (LASIX) 20 MG tablet Take 1 tablet by mouth once daily 07/13/22   Satira Sark, MD  GLOBAL EASE INJECT PEN NEEDLES 31G X 8 MM MISC USE 1 DAILY AS DIRECTED - RUN ON CASH $15 - PER PC. 12/21/17   [name], Marella Chimes, MD  glucose blood (ACCU-CHEK GUIDE) test strip USE 1 STRIP TO CHECK GLUCOSE TWO TIMES DAILY 09/09/22   [name], Marella Chimes, MD   insulin glargine, 2 Unit Dial, (TOUJEO MAX SOLOSTAR) 300 UNIT/ML Solostar Pen Inject 64 Units into the skin at bedtime. 07/08/22   Cassandria Anger, MD  Insulin Pen Needle (B-D ULTRAFINE III SHORT PEN) 31G X 8 MM MISC 1 each by Does not apply route as directed. 06/01/18   Cassandria Anger, MD  Lancets MISC 1 each by Does not apply route as directed. 10/05/16   Cassandria Anger, MD  levocetirizine (XYZAL) 5 MG tablet SMARTSIG:1 Tablet(s) By Mouth Every Evening 04/06/22   [provider]  lisinopril (ZESTRIL) 20 MG tablet Take 1 tablet by mouth once daily 07/13/22   Satira Sark, MD  loratadine (CLARITIN) 10 MG tablet Take 10 mg by mouth daily.    [provider]  melatonin 5 MG TABS Take 5 mg by mouth.    [provider]  metFORMIN (GLUCOPHAGE) 500 MG tablet Take 1 tablet (500 mg total) by mouth 2 (two) times daily with a meal. 08/10/22   [name], Marella Chimes, MD  metoprolol tartrate (LOPRESSOR) 25 MG tablet Take 0.5 tablets (12.5 mg total) by mouth 2 (two) times daily. 07/01/22   Satira Sark, MD  omeprazole (PRILOSEC) 20 MG capsule Take 20 mg by mouth daily.  12/24/19   [provider]  oxymetazoline (AFRIN) 0.05 % nasal spray Place 1 spray into both  nostrils 2 (two) times daily as needed for congestion.    [provider]  potassium chloride (KLOR-CON) 10 MEQ tablet TAKE 1 TABLET BY MOUTH DAILY 04/23/22   Satira Sark, MD  pravastatin (PRAVACHOL) 40 MG tablet TAKE 1 TABLET BY MOUTH AT BEDTIME 08/17/22   Satira Sark, MD  tadalafil (CIALIS) 20 MG tablet Take 20 mg by mouth daily as needed for erectile dysfunction.    [provider]  tamsulosin (FLOMAX) 0.4 MG CAPS capsule Take 1 capsule (0.4 mg total) by mouth daily. 03/25/22   Irine Seal, MD  warfarin (COUMADIN) 5 MG tablet TAKE 1 TABLET BY MOUTH ONCE DAILY EXCEPT  1/2  TABLET  ON  [facility]  OR  AS  DIRECTED 07/13/22   Satira Sark, MD       Allergies    Bactrim [sulfamethoxazole-trimethoprim], Other, and [name] flextouch [insulin degludec]    Review of Systems   Review of Systems  Constitutional:  Negative for appetite change, chills and fever.  Respiratory:  Positive for cough. Negative for shortness of breath and wheezing.   Cardiovascular:  Negative for chest pain.  Gastrointestinal:  Negative for abdominal pain, nausea and vomiting.  Genitourinary:  Positive for flank pain. Negative for difficulty urinating, dysuria and hematuria.  Musculoskeletal:  Positive for back pain.  Neurological:  Negative for weakness, numbness and headaches.    Physical Exam Updated Vital Signs BP (!) 150/80 (BP Location: Right Arm)   Pulse 76   Temp 98.2 F (36.8 C) (Oral)   Resp 18   Ht '5\' 10"'$  (1.778 m)   Wt 103.4 kg   SpO2 96%   BMI 32.71 kg/m  Physical Exam Vitals and nursing note reviewed.  Constitutional:      General: He is not in acute distress.    Appearance: Normal appearance. He is not ill-appearing.  HENT:     Mouth/Throat:     Mouth: Mucous membranes are moist.  Cardiovascular:     Rate and Rhythm: Normal rate and regular rhythm.     Pulses: Normal pulses.  Pulmonary:     Effort: Pulmonary effort is normal. No respiratory distress.     Breath sounds: No wheezing or rales.  Abdominal:     Palpations: Abdomen is soft.     Tenderness: There is no abdominal tenderness.  Musculoskeletal:        General: Normal range of motion.  Skin:    General: Skin is warm.     Capillary Refill: Capillary refill takes less than 2 seconds.     Findings: No rash.  Neurological:     General: No focal deficit present.     Mental Status: He is alert.     Sensory: No sensory deficit.     Motor: No weakness.     ED Results / Procedures / Treatments   Labs (all labs ordered are listed, but only abnormal results are displayed) Labs Reviewed  URINALYSIS, ROUTINE W REFLEX MICROSCOPIC - Abnormal; Notable for the following  components:      Result Value   Glucose, UA 150 (*)    Hgb urine dipstick MODERATE (*)    Protein, ur 100 (*)    Bacteria, UA RARE (*)    All other components within normal limits  BASIC METABOLIC PANEL - Abnormal; Notable for the following components:   Sodium 134 (*)    Glucose, Bld 129 (*)    Creatinine, Ser 1.39 (*)    Calcium 8.3 (*)  GFR, Estimated 54 (*)    All other components within normal limits  CBC WITH DIFFERENTIAL/PLATELET - Abnormal; Notable for the following components:   Hemoglobin 11.9 (*)    HCT 38.5 (*)    MCV 76.4 (*)    MCH 23.6 (*)    Neutro Abs 7.8 (*)    All other components within normal limits  PROTIME-INR - Abnormal; Notable for the following components:   Prothrombin Time 26.8 (*)    INR 2.5 (*)    All other components within normal limits  CULTURE, BLOOD (ROUTINE X 2)  CULTURE, BLOOD (ROUTINE X 2)  RESP PANEL BY RT-PCR (RSV, FLU A&B, COVID)  RVPGX2  LACTIC ACID, PLASMA  LACTIC ACID, PLASMA    EKG None  Radiology No results found.  Procedures Procedures    Medications Ordered in ED Medications  acetaminophen (TYLENOL) tablet 650 mg (650 mg Oral Given 09/27/22 1845)  cefTRIAXone (ROCEPHIN) injection 2 g (2 g Intramuscular Given 09/27/22 1843)  lidocaine (PF) (XYLOCAINE) 1 % injection (  Given 09/27/22 1848)    ED Course/ Medical Decision Making/ A&P                             Medical Decision Making Pt here for left posterior chest pain for several days.  Concerned that he has another kidney stone.  Also endorses occasional cough.  Denies shortness of breath   Differential includes kidney stone, MSK, PNA, PE, ACS.  PE and ACS felt less likely as pt denies dyspnea, currently anticoagulated, and chest pain is focal and posterior in location    Amount and/or Complexity of Data Reviewed Labs: ordered.    Details: No leukocytosis, creatinine slightly elevated but at baseline. Lactic acid reassuring.  Urine shows hematuria w/o  evidence of infection.  Resp panel  ordered for further eval of his cough and its negative.     Radiology: ordered.    Details: CXR shows left consolidation and pleural effusion.  CT renal stone study ordered as well for further eval of his hematuria.  Shows no evidence of obstructing kidney stone, patchy consolidation suggestive of PNA.   Discussion of management or test interpretation with external provider(s): Pt given tylenol for pain.   Discussed abx choice with pharmacy, given rocephin here rx written for zithromycin.  Appears approp for d/c home, vitals reassuring.  Agrees to close out pt f/u and return precautions discussed.    Risk Prescription drug management.           Final Clinical Impression(s) / ED Diagnoses Final diagnoses:  Community acquired pneumonia of left lung, unspecified part of lung    Rx / DC Orders ED Discharge Orders          Ordered    azithromycin (ZITHROMAX) 250 MG tablet        09/27/22 1819              Kem Parkinson, PA-C 09/30/22 2212    Sherwood Gambler, MD 10/02/22 1140

## 2022-10-02 LAB — CULTURE, BLOOD (ROUTINE X 2)
Culture: NO GROWTH
Culture: NO GROWTH
Special Requests: ADEQUATE
Special Requests: ADEQUATE

## 2022-10-05 DIAGNOSIS — R609 Edema, unspecified: Secondary | ICD-10-CM | POA: Diagnosis not present

## 2022-10-05 DIAGNOSIS — J90 Pleural effusion, not elsewhere classified: Secondary | ICD-10-CM | POA: Diagnosis not present

## 2022-10-05 DIAGNOSIS — R030 Elevated blood-pressure reading, without diagnosis of hypertension: Secondary | ICD-10-CM | POA: Diagnosis not present

## 2022-10-07 ENCOUNTER — Ambulatory Visit: Payer: Medicare Other | Admitting: Internal Medicine

## 2022-10-07 ENCOUNTER — Encounter: Payer: Self-pay | Admitting: Internal Medicine

## 2022-10-07 VITALS — BP 136/78 | HR 58 | Ht 70.5 in | Wt 228.0 lb

## 2022-10-07 DIAGNOSIS — J90 Pleural effusion, not elsewhere classified: Secondary | ICD-10-CM | POA: Insufficient documentation

## 2022-10-07 DIAGNOSIS — I10 Essential (primary) hypertension: Secondary | ICD-10-CM | POA: Diagnosis not present

## 2022-10-07 IMAGING — DX DG ABDOMEN 1V
1 series · 1 of 1 positions shown · non-contrast
Comparison: Abdominal radiograph dated 06/18/2021.

CLINICAL DATA: Renal stone.

EXAM:
ABDOMEN - 1 VIEW

[abdomen kub]
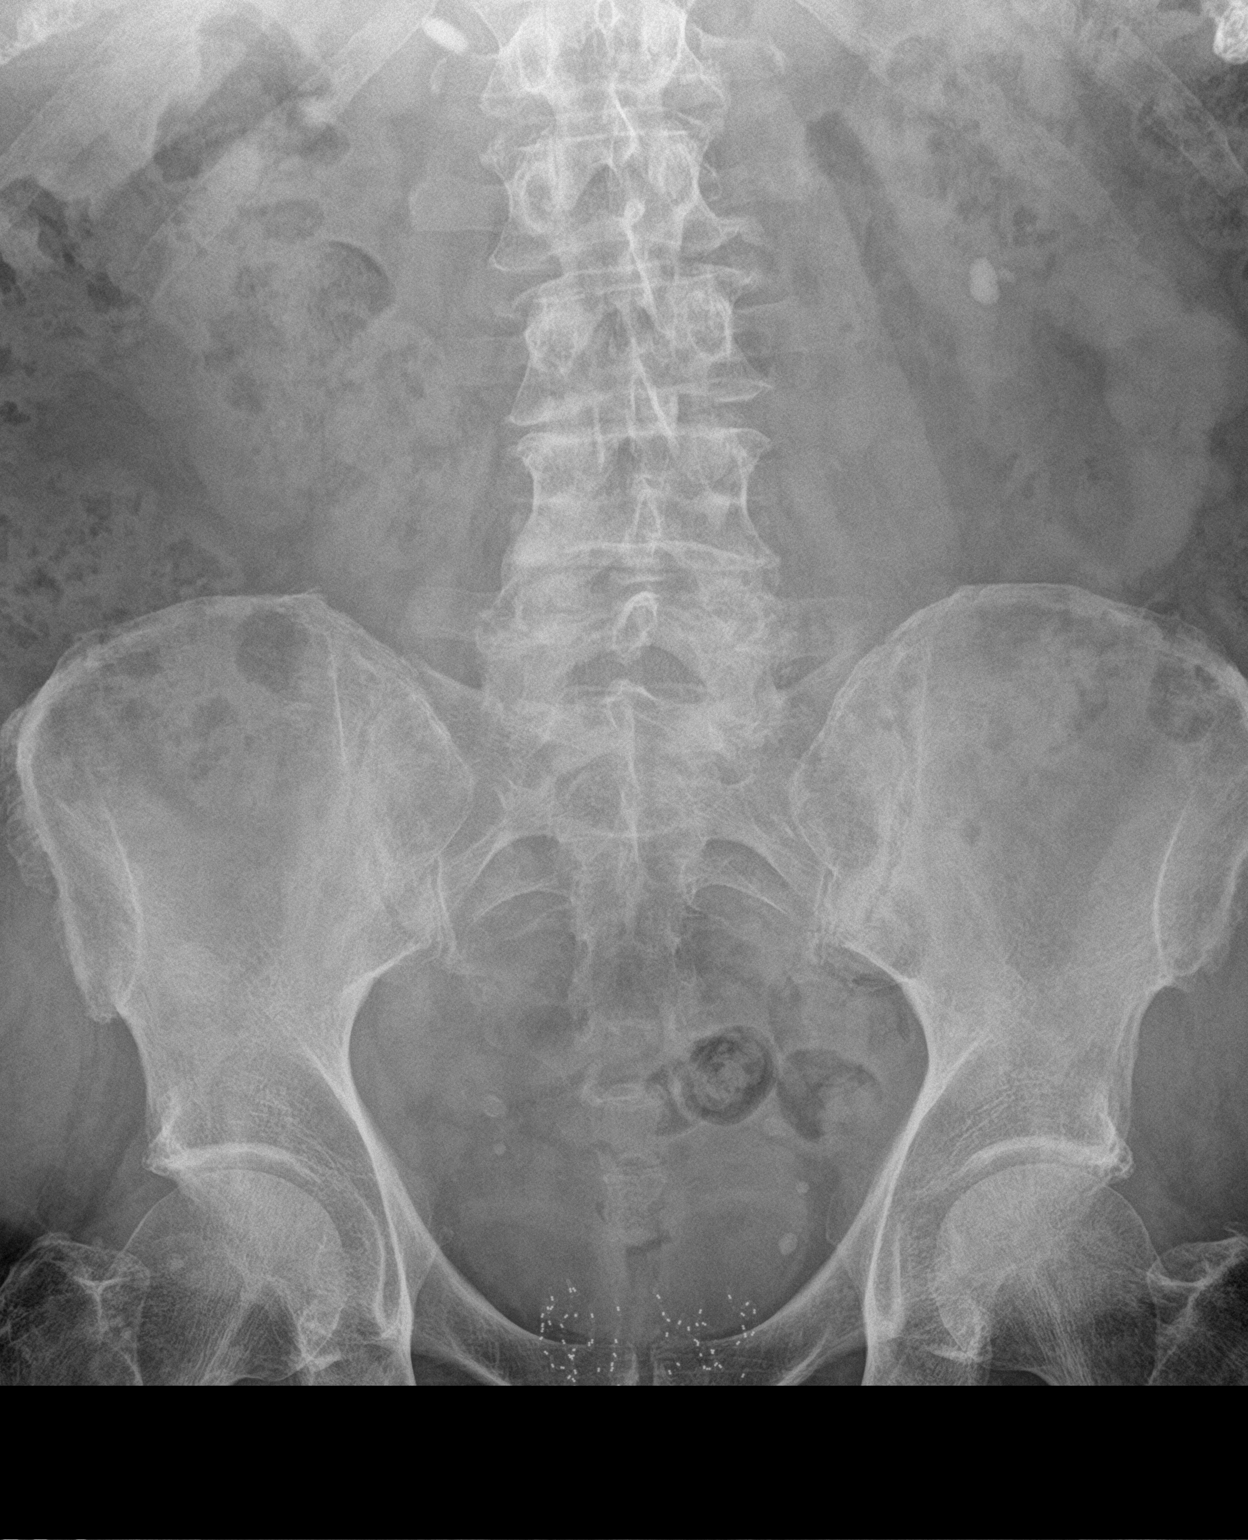

[1 of 1 positions shown; findings below may reference images not displayed]

FINDINGS: Two adjacent radiopaque calculi projecting over the inferior pole of
the left kidney with the larger measuring 11 mm. Indeterminate 13 mm
radiopaque density to the right of L1. There is moderate stool
throughout the colon. No bowel dilatation. Degenerative changes of
the spine. No acute osseous pathology. Prostate brachytherapy seeds.
IMPRESSION: Two adjacent radiopaque calculi projecting over the inferior pole of
the left kidney.

## 2022-10-07 MED ORDER — OLMESARTAN MEDOXOMIL 40 MG PO TABS: 40.0000 mg | ORAL_TABLET | Freq: Every day | ORAL | 11 refills | Status: DC

## 2022-10-07 NOTE — Progress Notes (Signed)
Daniel Schaefer, male    DOB: 09/07/1951    MRN: [id number]   Brief patient profile:  35  yowm  never smoker/ survived polio age 72 with L leg shorter  and CHF  referred  to pulmonary clinic in [place]  10/07/2022 by Dr Daniel Schaefer for pna/ L effusion  in setting of chronic throat clearing   Teeth cleaning Dec 21st 2023   Acute onset 09/23/22 L cp > to ER 1/15 dx pna/ fluid >> rx zmax Z>>  "sinuses really clear" / breathing better and less cp    History of Present Illness  10/07/2022  Pulmonary/ 1st office eval/ Daniel Schaefer / [name]  Chief Complaint  Patient presents with   Consult    Consult pleural effusion  Dyspnea:  fast walk or liftng heavy objects and moves them Cough: better / some throat clearing / white mucus  Sleep: onset had to sit up initially but now able lie flat bed either x 3-4 prior to OV   SABA use: none  02: none   No obvious day to day or daytime pattern/variability or assoc excess/ purulent sputum or mucus plugs or hemoptysis or cp or chest tightness, subjective wheeze or overt sinus or hb symptoms.   Sleeping now  without nocturnal  or early am exacerbation  of respiratory  c/o's or need for noct saba. Also denies any obvious fluctuation of symptoms with weather or environmental changes or other aggravating or alleviating factors except as outlined above   No unusual exposure hx or h/o childhood pna/ asthma or knowledge of premature birth.  Current Allergies, Complete Past Medical History, Past Surgical History, Family History, and Social History were reviewed in [employer] record.  ROS  The following are not active complaints unless bolded Hoarseness, sore throat, dysphagia, dental problems, itching, sneezing,  nasal congestion or discharge of excess mucus or purulent secretions, ear ache,   fever, chills, sweats, unintended wt loss or wt gain, classically pleuritic or exertional cp,  orthopnea pnd or arm/hand swelling  or leg  swelling improved , presyncope, palpitations, abdominal pain, anorexia, nausea, vomiting, diarrhea  or change in bowel habits or change in bladder habits, change in stools or change in urine, dysuria, hematuria,  rash, arthralgias, visual complaints, headache, numbness, weakness or ataxia or problems with walking or coordination,  change in mood or  memory.             Past Medical History:  Diagnosis Date   Atrial fibrillation [facility] [place])    Reportedly diagnosed November 2011   Essential hypertension    History of kidney stones    Prostate cancer [facility])    Radiation implants   Type 2 diabetes mellitus ([place])     Outpatient Medications Prior to Visit  Medication Sig Dispense Refill   amLODipine (NORVASC) 5 MG tablet Take 1 tablet by mouth once daily 90 tablet 3   Azelastine HCl 137 MCG/SPRAY SOLN Place 2 sprays into both nostrils daily.     Blood Glucose Monitoring Suppl (ACCU-CHEK GUIDE ME) w/Device KIT 1 Piece by Does not apply route as directed. 1 kit 0   Cholecalciferol (VITAMIN D3) 125 MCG (5000 UT) TABS Take 5,000 Units by mouth daily.      Flaxseed, Linseed, (FLAX SEEDS PO) Take by mouth.     furosemide (LASIX) 20 MG tablet Take 1 tablet by mouth once daily 90 tablet 0   GLOBAL EASE INJECT PEN NEEDLES 31G X 8 MM MISC USE 1 DAILY  AS DIRECTED - RUN ON CASH $15 - PER PC. 100 each 5   glucose blood (ACCU-CHEK GUIDE) test strip USE 1 STRIP TO CHECK GLUCOSE TWO TIMES DAILY 100 each 2   insulin glargine, 2 Unit Dial, (TOUJEO MAX SOLOSTAR) 300 UNIT/ML Solostar Pen Inject 64 Units into the skin at bedtime. 6 mL 2   Insulin Pen Needle (B-D ULTRAFINE III SHORT PEN) 31G X 8 MM MISC 1 each by Does not apply route as directed. 50 each 2   Lancets MISC 1 each by Does not apply route as directed. 100 each 3   levocetirizine (XYZAL) 5 MG tablet SMARTSIG:1 Tablet(s) By Mouth Every Evening     lisinopril (ZESTRIL) 20 MG tablet Take 1 tablet by mouth once daily 90 tablet 0   melatonin 5 MG TABS Take 5 mg  by mouth.     metFORMIN (GLUCOPHAGE) 500 MG tablet Take 1 tablet (500 mg total) by mouth 2 (two) times daily with a meal. 180 tablet 0   metoprolol tartrate (LOPRESSOR) 25 MG tablet Take 0.5 tablets (12.5 mg total) by mouth 2 (two) times daily. 45 tablet 3   omeprazole (PRILOSEC) 20 MG capsule Take 20 mg by mouth daily.      oxymetazoline (AFRIN) 0.05 % nasal spray Place 1 spray into both nostrils 2 (two) times daily as needed for congestion.     potassium chloride (KLOR-CON) 10 MEQ tablet TAKE 1 TABLET BY MOUTH DAILY 100 tablet 2   pravastatin (PRAVACHOL) 40 MG tablet TAKE 1 TABLET BY MOUTH AT BEDTIME 90 tablet 0   tadalafil (CIALIS) 20 MG tablet Take 20 mg by mouth daily as needed for erectile dysfunction.     tamsulosin (FLOMAX) 0.4 MG CAPS capsule Take 1 capsule (0.4 mg total) by mouth daily. 90 capsule 3   warfarin (COUMADIN) 5 MG tablet TAKE 1 TABLET BY MOUTH ONCE DAILY EXCEPT  1/2  TABLET  ON  WEDNESDAYS  OR  AS  DIRECTED 35 tablet 5   azithromycin (ZITHROMAX) 250 MG tablet Take 1 tablet daily x 5 days 5 tablet 0   loratadine (CLARITIN) 10 MG tablet Take 10 mg by mouth daily.     No facility-administered medications prior to visit.     Objective:     BP 136/78   Pulse (!) 58   Ht 5' 10.5" (1.791 m)   Wt 228 lb (103.4 kg)   SpO2 95% Comment: ra  BMI 32.25 kg/m   SpO2: 95 % (ra)  Amb pleasant wm nad   HEENT : Oropharynx  clear/ no obvious dental issues       NECK :  without  apparent JVD/ palpable Nodes/TM    LUNGS: no acc muscle use,  Nl contour chest with decreased bs/ dullness 1/2 up on L    CV:  RRR  no s3 or murmur or increase in P2, and no edema   ABD:  soft and nontender with nl inspiratory excursion in the supine position. No bruits or organomegaly appreciated   MS:  Nl gait/ ext warm without deformities Or obvious joint restrictions  calf tenderness, cyanosis or clubbing    SKIN: warm and dry without lesions    NEURO:  alert, approp, nl sensorium with   no motor or cerebellar deficits apparent.     Lab Results  Component Value Date   HGB 11.9 (L) 09/27/2022   HGB 14.0 08/31/2013    Lab Results  Component Value Date   INR 2.5 (H) 09/27/2022  INR 2.5 09/21/2022   INR 2.9 08/10/2022       Assessment   Pleural effusion on left Onset of L CP 09/23/22 though chest felt "congested" prior to that with last dental work 09/02/22   C/w pna/ parapneumonic effusion vs empyema assoc with dental work w/in the prior 2 weeks but symptomatically better p IV abx / zpak. Risk now is forming a rind that will require VATS in a pt with AF on coumadin and don't know for sure this is truly a benign parapneumonic process (? Hemothorax also in ddx with drop in hgb noted)  Rec U/s guided L thoracentesis asap holding coumadin the day prior with analysis for infection/ malignancy  Discussed in detail all the  indications, usual  risks and alternatives  relative to the benefits with patient who agrees to proceed with w/u as outlined.     Essential hypertension Try change acei to ARB 10/07/2022  due to chronic throat congestion  He has been having trouble "for awhile" with non-specific upper airway symptoms on acei.  ACE inhibitors are problematic in  pts with airway complaints because  even experienced pulmonologists can't always distinguish ace effects from copd/asthma.  By themselves they don't actually cause a problem, much like oxygen can't by itself start a fire, but they certainly serve as a powerful catalyst or enhancer for any "fire"  or inflammatory process in the upper airway, be it caused by an ET  tube or more commonly reflux (especially in the obese or pts with known GERD or who are on biphoshonates).    In the era of ARB near equivalency until we have a better handle on the reversibility of the airway problem, it just makes sense to avoid ACEI  entirely in the short run and then decide later, having established a level of airway control using a  reasonable limited regimen, whether to add back ace but even then being very careful to observe the pt for worsening airway control and number of meds used/ needed to control symptoms.    >> try olmesartan 20 mg daily and f/u bp with PCP          Each maintenance medication was reviewed in detail including emphasizing most importantly the difference between maintenance and prns and under what circumstances the prns are to be triggered using an action plan format where appropriate.  Total time for H and P, chart review, counseling,   and generating customized AVS unique to this office visit / same day charting  > 45 min with pt new to me          Christinia Gully, MD 10/07/2022

## 2022-10-07 NOTE — Assessment & Plan Note (Signed)
Try change acei to ARB 10/07/2022  due to chronic throat congestion  He has been having trouble "for awhile" with non-specific upper airway symptoms on acei.  ACE inhibitors are problematic in  pts with airway complaints because  even experienced pulmonologists can't always distinguish ace effects from copd/asthma.  By themselves they don't actually cause a problem, much like oxygen can't by itself start a fire, but they certainly serve as a powerful catalyst or enhancer for any "fire"  or inflammatory process in the upper airway, be it caused by an ET  tube or more commonly reflux (especially in the obese or pts with known GERD or who are on biphoshonates).    In the era of ARB near equivalency until we have a better handle on the reversibility of the airway problem, it just makes sense to avoid ACEI  entirely in the short run and then decide later, having established a level of airway control using a reasonable limited regimen, whether to add back ace but even then being very careful to observe the pt for worsening airway control and number of meds used/ needed to control symptoms.    >> try olmesartan 20 mg daily and f/u bp with PCP          Each maintenance medication was reviewed in detail including emphasizing most importantly the difference between maintenance and prns and under what circumstances the prns are to be triggered using an action plan format where appropriate.  Total time for H and P, chart review, counseling,   and generating customized AVS unique to this office visit / same day charting  > 45 min with pt new to me

## 2022-10-07 NOTE — Assessment & Plan Note (Signed)
Onset of L CP 09/23/22 though chest felt "congested" prior to that with last dental work 09/02/22   C/w pna/ parapneumonic effusion vs empyema assoc with dental work w/in the prior 2 weeks but symptomatically better p IV abx / zpak. Risk now is forming a rind that will require VATS in a pt with AF on coumadin and don't know for sure this is truly a benign parapneumonic process (? Hemothorax also in ddx with drop in hgb noted)  Rec U/s guided L thoracentesis asap holding coumadin the day prior with analysis for infection/ malignancy  Discussed in detail all the  indications, usual  risks and alternatives  relative to the benefits with patient who agrees to proceed with w/u as outlined.

## 2022-10-07 NOTE — Patient Instructions (Addendum)
Stop lisinopril   Start olmesartan 20 mg one daily   My office will be contacting you by phone for referral to get L fluid removed (thoracentesis)   - if you don't hear back from my office within one week please call us back or notify us thru MyChart and we'll address it right away.   Hold your dose of coumadin today and tomorrow until you hear from [place].  I will call with the results when available

## 2022-10-08 ENCOUNTER — Other Ambulatory Visit: Payer: Self-pay | Admitting: "Endocrinology

## 2022-10-09 ENCOUNTER — Other Ambulatory Visit: Payer: Self-pay | Admitting: Cardiology

## 2022-10-11 ENCOUNTER — Ambulatory Visit (HOSPITAL_COMMUNITY)
Admission: RE | Admit: 2022-10-11 | Discharge: 2022-10-11 | Disposition: A | Discharge status: Home / Self Care | Payer: Medicare Other | Source: Ambulatory Visit | Attending: Diagnostic Radiology | Admitting: Diagnostic Radiology

## 2022-10-11 ENCOUNTER — Other Ambulatory Visit: Payer: Self-pay

## 2022-10-11 ENCOUNTER — Encounter (HOSPITAL_COMMUNITY): Payer: Self-pay

## 2022-10-11 ENCOUNTER — Ambulatory Visit (HOSPITAL_COMMUNITY)
Admission: RE | Admit: 2022-10-11 | Discharge: 2022-10-11 | Disposition: A | Discharge status: Home / Self Care | Payer: Medicare Other | Source: Ambulatory Visit | Attending: Internal Medicine | Admitting: Internal Medicine

## 2022-10-11 VITALS — BP 158/88 | HR 58 | Temp 97.6°F | Resp 18

## 2022-10-11 DIAGNOSIS — J9811 Atelectasis: Secondary | ICD-10-CM | POA: Diagnosis not present

## 2022-10-11 DIAGNOSIS — J90 Pleural effusion, not elsewhere classified: Secondary | ICD-10-CM | POA: Insufficient documentation

## 2022-10-11 DIAGNOSIS — Z8546 Personal history of malignant neoplasm of prostate: Secondary | ICD-10-CM | POA: Diagnosis not present

## 2022-10-11 DIAGNOSIS — R846 Abnormal cytological findings in specimens from respiratory organs and thorax: Secondary | ICD-10-CM | POA: Diagnosis not present

## 2022-10-11 LAB — GRAM STAIN: Gram Stain: NONE SEEN

## 2022-10-11 LAB — BODY FLUID CELL COUNT WITH DIFFERENTIAL
Eos, Fluid: 1 %
Lymphs, Fluid: 65 %
Monocyte-Macrophage-Serous Fluid: 31 % — ABNORMAL LOW (ref 50–90)
Neutrophil Count, Fluid: 3 % (ref 0–25)
Total Nucleated Cell Count, Fluid: 1872 cu mm — ABNORMAL HIGH (ref 0–1000)

## 2022-10-11 LAB — LACTATE DEHYDROGENASE, PLEURAL OR PERITONEAL FLUID: LD, Fluid: 108 U/L — ABNORMAL HIGH (ref 3–23)

## 2022-10-11 LAB — PROTEIN, PLEURAL OR PERITONEAL FLUID: Total protein, fluid: 4.1 g/dL

## 2022-10-11 LAB — GLUCOSE, PLEURAL OR PERITONEAL FLUID: Glucose, Fluid: 188 mg/dL

## 2022-10-11 MED ORDER — LIDOCAINE HCL (PF) 2 % IJ SOLN
INTRAMUSCULAR | Status: AC
  Administered 2022-10-11: 10 mL
  Filled 2022-10-11: qty 10

## 2022-10-11 NOTE — Progress Notes (Signed)
Patient tolerated left sided thoracentesis procedure well today and 400 mL of serosanguinous fluid removed and sent to lab for processing. Pt verbalized understanding of discharge instructions and ambulatory at departure with no acute distress noted.

## 2022-10-11 NOTE — Procedures (Signed)
PreOperative Dx: LEFT pleural effusion Postoperative Dx: LEFT pleural effusion Procedure:   US guided LEFT thoracentesis Radiologist:  Thornton Papas Anesthesia:  10 ml of 1% lidocaine Specimen:  400 mL of serosanguinous fluid EBL:   < 1 ml Complications:  None

## 2022-10-12 ENCOUNTER — Other Ambulatory Visit: Payer: Self-pay

## 2022-10-12 LAB — AMYLASE, BODY FLUID (OTHER): Amylase, Body Fluid: 39 U/L

## 2022-10-12 LAB — CYTOLOGY - NON PAP

## 2022-10-12 MED ORDER — TOUJEO MAX SOLOSTAR 300 UNIT/ML ~~LOC~~ SOPN: PEN_INJECTOR | SUBCUTANEOUS | 1 refills | Status: DC

## 2022-10-13 ENCOUNTER — Encounter: Payer: Self-pay | Admitting: Internal Medicine

## 2022-10-14 DIAGNOSIS — I4891 Unspecified atrial fibrillation: Secondary | ICD-10-CM | POA: Diagnosis not present

## 2022-10-14 DIAGNOSIS — I10 Essential (primary) hypertension: Secondary | ICD-10-CM | POA: Diagnosis not present

## 2022-10-14 DIAGNOSIS — E1169 Type 2 diabetes mellitus with other specified complication: Secondary | ICD-10-CM | POA: Diagnosis not present

## 2022-10-14 NOTE — Telephone Encounter (Signed)
Thank you for responding and letting me know that everything was negative or benign, however, was this caused from pneumonia is  what I would like to know that caused this?   But, I just wanted to ask a question that when I'm taking breaths in I'm still feeling tenderness on the left front side, is that normal after these type of procedures or anything I need to be looking out for for the fluid to be coming back.   Thank you, Daniel Schaefer   Dr. Melvyn Novas please advise

## 2022-10-16 LAB — CULTURE, BODY FLUID W GRAM STAIN -BOTTLE: Culture: NO GROWTH

## 2022-10-18 ENCOUNTER — Telehealth: Payer: Self-pay

## 2022-10-18 ENCOUNTER — Telehealth: Payer: Self-pay | Admitting: "Endocrinology

## 2022-10-18 DIAGNOSIS — Z794 Long term (current) use of insulin: Secondary | ICD-10-CM

## 2022-10-18 MED ORDER — ACCU-CHEK GUIDE VI STRP: ORAL_STRIP | 2 refills | Status: DC

## 2022-10-18 NOTE — Telephone Encounter (Signed)
Pt called stating he needs a refill for his Accuchek strips.  He checks 3 times a day instead of 2 so he has run out quicker and pharmacy will not refill until 10/22/22.

## 2022-10-18 NOTE — Telephone Encounter (Signed)
Rx for test strips sent in earlier today.

## 2022-10-18 NOTE — Telephone Encounter (Signed)
Pt called stating he checks his BG three times daily and is need of a refill for his accu-chek guide test strips. Rx for accu-chek guide test strips sent to pharmacy.

## 2022-10-19 DIAGNOSIS — R030 Elevated blood-pressure reading, without diagnosis of hypertension: Secondary | ICD-10-CM | POA: Diagnosis not present

## 2022-10-19 DIAGNOSIS — J90 Pleural effusion, not elsewhere classified: Secondary | ICD-10-CM | POA: Diagnosis not present

## 2022-10-19 DIAGNOSIS — R609 Edema, unspecified: Secondary | ICD-10-CM | POA: Diagnosis not present

## 2022-10-25 ENCOUNTER — Ambulatory Visit (HOSPITAL_COMMUNITY)
Admission: RE | Admit: 2022-10-25 | Discharge: 2022-10-25 | Disposition: A | Discharge status: Home / Self Care | Payer: Medicare Other | Source: Ambulatory Visit | Attending: Internal Medicine | Admitting: Internal Medicine

## 2022-10-25 ENCOUNTER — Ambulatory Visit: Payer: Medicare Other | Admitting: Internal Medicine

## 2022-10-25 ENCOUNTER — Encounter: Payer: Self-pay | Admitting: Internal Medicine

## 2022-10-25 VITALS — BP 136/88 | HR 76 | Ht 70.5 in | Wt 227.6 lb

## 2022-10-25 DIAGNOSIS — I10 Essential (primary) hypertension: Secondary | ICD-10-CM

## 2022-10-25 DIAGNOSIS — J90 Pleural effusion, not elsewhere classified: Secondary | ICD-10-CM | POA: Diagnosis not present

## 2022-10-25 NOTE — Assessment & Plan Note (Addendum)
Try change acei to ARB 10/07/2022  due to chronic throat congestion> much better 10/25/2022 ov   Although even in retrospect it may not be clear the ACEi contributed to the pt's symptoms,  he clearly  improved off them and adding them back at this point or in the future would risk confusion in interpretation of non-specific respiratory symptoms to which this patient is prone  ie  Better not to muddy the waters here.    >>>  benicar 40 mg daily and f/u with PCP when time for next ov/refills           Each maintenance medication was reviewed in detail including emphasizing most importantly the difference between maintenance and prns and under what circumstances the prns are to be triggered using an action plan format where appropriate.  Total time for H and P, chart review, counseling,  and generating customized AVS unique to this office visit / same day charting >  30 min summary f/u ov

## 2022-10-25 NOTE — Patient Instructions (Signed)
Ok to resume exercise   Please schedule a follow up office visit in 6 weeks, call sooner if needed

## 2022-10-25 NOTE — Progress Notes (Signed)
Daniel Schaefer, male    DOB: Feb 11, 1951    MRN: [id number]   Brief patient profile:  30  yowm  never smoker/ survived polio age 72 with L leg shorter  and CHF  referred  to pulmonary clinic in [place]  10/07/2022 by Dr Jimmye Norman for pna/ L effusion  in setting of chronic throat clearing   Teeth cleaning Dec 21st 2023   Acute onset 09/23/22 L cp > to ER 1/15 dx pna/ fluid >> rx zmax  >>  "sinuses really clear" / breathing better and less cp    History of Present Illness  10/07/2022  Pulmonary/ 1st office eval/ Daniel Schaefer / [place] [facility]  Chief Complaint  Patient presents with   Consult    Consult pleural effusion  Dyspnea:  fast walk or liftng heavy objects and moves them Cough: better / some throat clearing / white mucus  Sleep: onset had to sit up initially but now able lie flat bed either x 3-4 prior to OV   SABA use: none  02: none  Rec Stop lisinopril  Start olmesartan 20 mg one daily     L T centesis  10/11/2022  400 cc  nl glucose, wbc 1872 L>P  and low protein/ ldh with cytology >>> benign     10/25/2022  f/u ov/[place] office/Daniel Schaefer re: L pleural effusion  / cough   Chief Complaint  Patient presents with   Follow-up    Breathing doing well. Had CXR today   Dyspnea:  not limited  Cough: better  Sleeping: level bed / on side with one pillow SABA use: none  02: none  Covid status: vax x all/ infected x 2    No obvious day to day or daytime variability or assoc excess/ purulent sputum or mucus plugs or hemoptysis or cp or chest tightness, subjective wheeze or overt sinus or hb symptoms.   Sleeping  without nocturnal  or early am exacerbation  of respiratory  c/o's or need for noct saba. Also denies any obvious fluctuation of symptoms with weather or environmental changes or other aggravating or alleviating factors except as outlined above   No unusual exposure hx or h/o childhood pna/ asthma or knowledge of premature birth.  Current Allergies, Complete Past Medical  History, Past Surgical History, Family History, and Social History were reviewed in [employer] record.  ROS  The following are not active complaints unless bolded Hoarseness, sore throat, dysphagia, dental problems, itching, sneezing,  nasal congestion or discharge of excess mucus or purulent secretions, ear ache,   fever, chills, sweats, unintended wt loss or wt gain, classically pleuritic or exertional cp,  orthopnea pnd or arm/hand swelling  or leg swelling, presyncope, palpitations, abdominal pain, anorexia, nausea, vomiting, diarrhea  or change in bowel habits or change in bladder habits, change in stools or change in urine, dysuria, hematuria,  rash, arthralgias, visual complaints, headache, numbness, weakness or ataxia or problems with walking or coordination,  change in mood or  memory.        Current Meds  Medication Sig   amLODipine (NORVASC) 5 MG tablet Take 1 tablet by mouth once daily   Azelastine HCl 137 MCG/SPRAY SOLN Place 2 sprays into both nostrils daily.   Blood Glucose Monitoring Suppl (ACCU-CHEK GUIDE ME) w/Device KIT 1 Piece by Does not apply route as directed.   Cholecalciferol (VITAMIN D3) 125 MCG (5000 UT) TABS Take 5,000 Units by mouth daily.    Flaxseed, Linseed, (FLAX SEEDS PO) Take  by mouth.   furosemide (LASIX) 20 MG tablet Take 1 tablet by mouth once daily   GLOBAL EASE INJECT PEN NEEDLES 31G X 8 MM MISC USE 1 DAILY AS DIRECTED - RUN ON CASH $15 - PER PC.   glucose blood (ACCU-CHEK GUIDE) test strip USE 1 STRIP TO CHECK GLUCOSE THREE TIMES DAILY   insulin glargine, 2 Unit Dial, (TOUJEO MAX SOLOSTAR) 300 UNIT/ML Solostar Pen INJECT 64 UNITS SUBCUTANEOUSLY AT BEDTIME   Insulin Pen Needle (B-D ULTRAFINE III SHORT PEN) 31G X 8 MM MISC 1 each by Does not apply route as directed.   Lancets MISC 1 each by Does not apply route as directed.   levocetirizine (XYZAL) 5 MG tablet SMARTSIG:1 Tablet(s) By Mouth Every Evening   melatonin 5 MG TABS Take 5 mg  by mouth.   metFORMIN (GLUCOPHAGE) 500 MG tablet Take 1 tablet (500 mg total) by mouth 2 (two) times daily with a meal.   metoprolol tartrate (LOPRESSOR) 25 MG tablet Take 0.5 tablets (12.5 mg total) by mouth 2 (two) times daily.   olmesartan (BENICAR) 40 MG tablet Take 1 tablet (40 mg total) by mouth daily.   omeprazole (PRILOSEC) 20 MG capsule Take 20 mg by mouth daily.    oxymetazoline (AFRIN) 0.05 % nasal spray Place 1 spray into both nostrils 2 (two) times daily as needed for congestion.   potassium chloride (KLOR-CON) 10 MEQ tablet TAKE 1 TABLET BY MOUTH DAILY   pravastatin (PRAVACHOL) 40 MG tablet TAKE 1 TABLET BY MOUTH AT BEDTIME   tadalafil (CIALIS) 20 MG tablet Take 20 mg by mouth daily as needed for erectile dysfunction.   tamsulosin (FLOMAX) 0.4 MG CAPS capsule Take 1 capsule (0.4 mg total) by mouth daily.   warfarin (COUMADIN) 5 MG tablet TAKE 1 TABLET BY MOUTH ONCE DAILY EXCEPT  1/2  TABLET  ON  WEDNESDAYS  OR  AS  DIRECTED                      Past Medical History:  Diagnosis Date   Atrial fibrillation [facility])    Reportedly diagnosed November 2011   Essential hypertension    History of kidney stones    Prostate cancer [facility])    Radiation implants   Type 2 diabetes mellitus ([place])         Objective:     Wt Readings from Last 3 Encounters:  10/25/22 227 lb 9.6 oz (103.2 kg)  10/07/22 228 lb (103.4 kg)  09/27/22 228 lb (103.4 kg)      Vital signs reviewed  10/25/2022  - Note at rest 02 sats  96% on RA   General appearance:   robust amb wm nad    HEENT : Oropharynx  clear         NECK :  without  apparent JVD/ palpable Nodes/TM    LUNGS: no acc muscle use,  Nl contour chest which is clear to A and P bilaterally without cough on insp or exp maneuvers   CV:  RRR  no s3 or murmur or increase in P2, and no edema   ABD:  soft and nontender with nl inspiratory excursion in the supine position. No bruits or organomegaly appreciated   MS:  Nl gait/ ext warm  without deformities Or obvious joint restrictions  calf tenderness, cyanosis or clubbing    SKIN: warm and dry without lesions    NEURO:  alert, approp, nl sensorium with  no motor or cerebellar deficits apparent.  CXR PA and Lateral:   10/25/2022 :    I personally reviewed images and impression is as follows:     Marked serial improvement aeration L lung, min residual effusion        Assessment

## 2022-10-25 NOTE — Assessment & Plan Note (Signed)
Onset of L CP 09/23/22 though chest felt "congested" prior to that with last dental work 09/02/22  - Tcentesis on L  10/11/2022  400 cc  nl glucose, wbc 1872 L>P  and low protein/ ldh with cytology >>> benign - 10/25/2022 marked improvement on cxr vs prior to t centesis and post tcentesis pCXR  Likely benign parapneumonic process and no further studies needed   One final f/u ov at 6 weeks, call sooner prn

## 2022-11-02 ENCOUNTER — Ambulatory Visit: Payer: Medicare Other | Attending: Cardiology | Admitting: *Deleted

## 2022-11-02 DIAGNOSIS — I4821 Permanent atrial fibrillation: Secondary | ICD-10-CM | POA: Diagnosis not present

## 2022-11-02 DIAGNOSIS — Z5181 Encounter for therapeutic drug level monitoring: Secondary | ICD-10-CM

## 2022-11-02 LAB — POCT INR: INR: 2.4 (ref 2.0–3.0)

## 2022-11-02 NOTE — Patient Instructions (Signed)
Continue warfarin 1 tablet daily except 1/2 tablet on Wednesdays.   Continue greens Recheck in 6 weeks

## 2022-11-09 ENCOUNTER — Ambulatory Visit: Payer: Medicare Other | Admitting: "Endocrinology

## 2022-11-09 ENCOUNTER — Encounter: Payer: Self-pay | Admitting: "Endocrinology

## 2022-11-09 VITALS — BP 146/82 | HR 52 | Ht 70.5 in | Wt 226.8 lb

## 2022-11-09 DIAGNOSIS — Z794 Long term (current) use of insulin: Secondary | ICD-10-CM | POA: Diagnosis not present

## 2022-11-09 DIAGNOSIS — I10 Essential (primary) hypertension: Secondary | ICD-10-CM | POA: Diagnosis not present

## 2022-11-09 DIAGNOSIS — E1159 Type 2 diabetes mellitus with other circulatory complications: Secondary | ICD-10-CM

## 2022-11-09 DIAGNOSIS — E782 Mixed hyperlipidemia: Secondary | ICD-10-CM | POA: Diagnosis not present

## 2022-11-09 LAB — POCT GLYCOSYLATED HEMOGLOBIN (HGB A1C): HbA1c, POC (controlled diabetic range): 7.8 % — AB (ref 0.0–7.0)

## 2022-11-09 NOTE — Patient Instructions (Signed)

## 2022-11-09 NOTE — Progress Notes (Signed)
11/09/2022  Endocrinology follow-up note   Subjective:    Patient ID: Daniel Schaefer, male    DOB: 12/02/1950,    Past Medical History:  Diagnosis Date   Atrial fibrillation [facility])    Reportedly diagnosed November 2011   Essential hypertension    History of kidney stones    Prostate cancer [facility])    Radiation implants   Type 2 diabetes mellitus (Daniel Schaefer)    Past Surgical History:  Procedure Laterality Date   Gold seed placement     INGUINAL HERNIA REPAIR Left 07/02/2020   Procedure: LEFT INGUINAL HERNIORRHAPY WITH MESH;  Surgeon: Aviva Signs, MD;  Location: [facility];  Service: General;  Laterality: Left;   LITHOTRIPSY     PROSTATE BIOPSY     TRIGGER FINGER RELEASE Left    ring finger   URETEROSCOPY WITH HOLMIUM LASER LITHOTRIPSY     Social History   Socioeconomic History   Marital status: Divorced    Spouse name: Not on file   Number of children: Not on file   Years of education: Not on file   Highest education level: Not on file  Occupational History   Not on file  Tobacco Use   Smoking status: Never   Smokeless tobacco: Never  Vaping Use   Vaping Use: Never used  Substance and Sexual Activity   Alcohol use: No    Alcohol/week: 0.0 standard drinks of alcohol   Drug use: No   Sexual activity: Yes  Other Topics Concern   Not on file  Social History Narrative   Not on file   Social Determinants of Health   Financial Resource Strain: Not on file  Food Insecurity: Not on file  Transportation Needs: Not on file  Physical Activity: Not on file  Stress: Not on file  Social Connections: Not on file   Outpatient Encounter Medications as of 11/09/2022  Medication Sig   amLODipine (NORVASC) 5 MG tablet Take 1 tablet by mouth once daily   Azelastine HCl 137 MCG/SPRAY SOLN Place 2 sprays into both nostrils daily.   Blood Glucose Monitoring Suppl (ACCU-CHEK GUIDE ME) w/Device KIT 1 Piece by Does not apply route as directed.   Cholecalciferol (VITAMIN D3) 125 MCG  (5000 UT) TABS Take 5,000 Units by mouth daily.    Flaxseed, Linseed, (FLAX SEEDS PO) Take by mouth.   furosemide (LASIX) 20 MG tablet Take 1 tablet by mouth once daily   GLOBAL EASE INJECT PEN NEEDLES 31G X 8 MM MISC USE 1 DAILY AS DIRECTED - RUN ON CASH $15 - PER PC.   glucose blood (ACCU-CHEK GUIDE) test strip USE 1 STRIP TO CHECK GLUCOSE THREE TIMES DAILY   insulin glargine, 2 Unit Dial, (TOUJEO MAX SOLOSTAR) 300 UNIT/ML Solostar Pen INJECT 64 UNITS SUBCUTANEOUSLY AT BEDTIME   Insulin Pen Needle (B-D ULTRAFINE III SHORT PEN) 31G X 8 MM MISC 1 each by Does not apply route as directed.   Lancets MISC 1 each by Does not apply route as directed.   levocetirizine (XYZAL) 5 MG tablet SMARTSIG:1 Tablet(s) By Mouth Every Evening   melatonin 5 MG TABS Take 5 mg by mouth.   metFORMIN (GLUCOPHAGE) 500 MG tablet Take 1 tablet (500 mg total) by mouth 2 (two) times daily with a meal.   metoprolol tartrate (LOPRESSOR) 25 MG tablet Take 0.5 tablets (12.5 mg total) by mouth 2 (two) times daily.   olmesartan (BENICAR) 40 MG tablet Take 1 tablet (40 mg total) by mouth daily.   omeprazole (  PRILOSEC) 20 MG capsule Take 20 mg by mouth daily.    oxymetazoline (AFRIN) 0.05 % nasal spray Place 1 spray into both nostrils 2 (two) times daily as needed for congestion.   potassium chloride (KLOR-CON) 10 MEQ tablet TAKE 1 TABLET BY MOUTH DAILY   pravastatin (PRAVACHOL) 40 MG tablet TAKE 1 TABLET BY MOUTH AT BEDTIME   tadalafil (CIALIS) 20 MG tablet Take 20 mg by mouth daily as needed for erectile dysfunction.   tamsulosin (FLOMAX) 0.4 MG CAPS capsule Take 1 capsule (0.4 mg total) by mouth daily.   warfarin (COUMADIN) 5 MG tablet TAKE 1 TABLET BY MOUTH ONCE DAILY EXCEPT  1/2  TABLET  ON  WEDNESDAYS  OR  AS  DIRECTED   No facility-administered encounter medications on file as of 11/09/2022.   ALLERGIES: Allergies  Allergen Reactions   Bactrim [Sulfamethoxazole-Trimethoprim] Other (See Comments)    Thin his blood  really bad   Other     Cannot mix warfarin and bactrim    [name] Flextouch [Insulin Degludec] Rash   VACCINATION STATUS: Immunization History  Administered Date(s) Administered   [employer] Vaccination 10/04/2019, 11/05/2019    Diabetes He presents for his follow-up diabetic visit. He has type 2 diabetes mellitus. Onset time: He was diagnosed at approximate age of 44 years. His disease course has been worsening. There are no hypoglycemic associated symptoms. There are no diabetic associated symptoms. There are no hypoglycemic complications. Symptoms are worsening. Diabetic complications include nephropathy. Risk factors for coronary artery disease include hypertension, dyslipidemia, sedentary lifestyle, male sex and diabetes mellitus. Current diabetic treatment includes oral agent (monotherapy) and insulin injections. He is compliant with treatment most of the time. His weight is fluctuating minimally. He is following a generally unhealthy diet. When asked about meal planning, he reported none. He has not had a previous visit with a dietitian. He rarely participates in exercise. His home blood glucose trend is increasing steadily. His breakfast blood glucose range is generally 140-180 mg/dl. His bedtime blood glucose range is generally 180-200 mg/dl. His overall blood glucose range is 180-200 mg/dl. (He comes with his logs and meter showing average fasting blood glucose between 150-165 mg/day.  His point-of-care A1c is increasing to 7.8%.  No hypoglycemia was documented.   ) An ACE inhibitor/angiotensin II receptor blocker is being taken. He does not see a podiatrist.Eye exam is current.  Hypertension This is a chronic problem. The current episode started more than 1 year ago. The problem is unchanged. The problem is controlled. There are no associated agents to hypertension. Risk factors for coronary artery disease include diabetes mellitus, dyslipidemia, male gender and sedentary  lifestyle. Past treatments include ACE inhibitors, calcium channel blockers, diuretics and beta blockers. The current treatment provides moderate improvement. There are no compliance problems.  Hypertensive end-organ damage includes kidney disease. Identifiable causes of hypertension include chronic renal disease.  Hyperlipidemia This is a chronic problem. The current episode started more than 1 year ago. The problem is controlled. Recent lipid tests were reviewed and are normal. Exacerbating diseases include chronic renal disease and diabetes. Factors aggravating his hyperlipidemia include beta blockers. Current antihyperlipidemic treatment includes statins. The current treatment provides moderate improvement of lipids. There are no compliance problems.  Risk factors for coronary artery disease include dyslipidemia, diabetes mellitus, hypertension, male sex and a sedentary lifestyle.    Review of systems  Constitutional: + Minimally fluctuating body weight,  current Body mass index is 32.08 kg/m. , no fatigue, no subjective hyperthermia, no subjective  hypothermia    Objective:    BP (!) 146/82 Comment: 148/76 L arm with manuel cuff  Pulse (!) 52   Ht 5' 10.5" (1.791 m)   Wt 226 lb 12.8 oz (102.9 kg)   BMI 32.08 kg/m   Wt Readings from Last 3 Encounters:  11/09/22 226 lb 12.8 oz (102.9 kg)  10/25/22 227 lb 9.6 oz (103.2 kg)  10/07/22 228 lb (103.4 kg)    BP Readings from Last 3 Encounters:  11/09/22 (!) 146/82  10/25/22 136/88  10/11/22 (!) 158/88     Physical Exam- Limited  Constitutional:  Body mass index is 32.08 kg/m. , not in acute distress, normal state of mind Eyes:  EOMI, no exophthalmos     Results for orders placed or performed in visit on 11/09/22  HgB A1c  Result Value Ref Range   Hemoglobin A1C     HbA1c POC (<> result, manual entry)     HbA1c, POC (prediabetic range)     HbA1c, POC (controlled diabetic range) 7.8 (A) 0.0 - 7.0 %   Complete Blood Count  (Most recent): Lab Results  Component Value Date   WBC 10.2 09/27/2022   HGB 11.9 (L) 09/27/2022   HCT 38.5 (L) 09/27/2022   MCV 76.4 (L) 09/27/2022   PLT 269 09/27/2022   Chemistry (most recent): Lab Results  Component Value Date   NA 134 (L) 09/27/2022   K 3.6 09/27/2022   CL 99 09/27/2022   CO2 25 09/27/2022   BUN 23 09/27/2022   CREATININE 1.39 (H) 09/27/2022   Lipid Panel     Component Value Date/Time   CHOL 149 03/29/2022 0000   TRIG 125 03/29/2022 0000   HDL 44 03/29/2022 0000   LDLCALC 80 03/29/2022 0000    Assessment & Plan:   1) Type 2 diabetes mellitus with stage 3 renal insufficiency, with long-term current use of insulin ([place])  He comes with his logs and meter showing average fasting blood glucose between 150-165 mg/day.  His point-of-care A1c is increasing to 7.8%.  No hypoglycemia was documented.    -Patient remains at a high risk for more acute and chronic complications of diabetes which include CAD, CVA, CKD, retinopathy, and neuropathy. These are all discussed in detail with the patient.  -Recent labs reviewed showing improved renal function.  - Nutritional counseling repeated at each appointment due to patients tendency to fall back in to old habits.  - he acknowledges that there is a room for improvement in his food and drink choices. - Suggestion is made for him to avoid simple carbohydrates  from his diet including Cakes, Sweet Desserts, Ice Cream, Soda (diet and regular), Sweet Tea, Candies, Chips, Cookies, Store Bought Juices, Alcohol , Artificial Sweeteners,  Coffee Creamer, and "Sugar-free" Products, Lemonade. This will help patient to have more stable blood glucose profile and potentially avoid unintended weight gain.   The following Lifestyle Medicine recommendations according to [place]  [facility]) were discussed and and offered to patient and he  agrees to start the journey:  A. Whole Foods, Plant-Based Nutrition  comprising of fruits and vegetables, plant-based proteins, whole-grain carbohydrates was discussed in detail with the patient.   A list for source of those nutrients were also provided to the patient.  Patient will use only water or unsweetened tea for hydration. B.  The need to stay away from risky substances including alcohol, smoking; obtaining 7 to 9 hours of restorative sleep, at least 150 minutes  of moderate intensity exercise weekly, the importance of healthy social connections.   -He is following with Jearld Fenton, CDE for diabetes education.  - He promises to improve his diet and exercise and wishes to keep his regimen the same: Toujeo 64 units nightly, associated with monitoring of blood glucose twice a day-daily before breakfast and at bedtime.       He worries about cost of medications, previously declined offer for Trulicity/Ozempic weekly injections.   He has sulfa allergy, not a candidate for glipizide.  He will continue to benefit from low-dose metformin.  Extended release metformin is not covered by his insurance.  He will be switched to regular strength metformin 500 mg p.o. twice daily. -He is encouraged to continue monitoring blood glucose twice daily, before breakfast and before bed, and to call the clinic if he has readings less than 70 or greater than 150 mg per DL 3 days in a week at fasting.    - He is generally hesitant to add medications.  - Patient specific target  for A1c; LDL, HDL, Triglycerides, and  Waist Circumference were discussed in detail.  2) BP/HTN: His blood pressure is not controlled to target.  He is advised to continue Norvasc 5 mg po daily, Lasix 20 mg po daily, Lisinopril 20 mg po daily, and Metoprolol 25 mg po twice daily.  3) Lipids/HPL:  His most recent lipid panel from 03/24/20 shows controlled LDL at 80.    He is advised to continue pravastatin 40 mg p.o. daily at bedtime.   Side effects and precautions discussed with him.  He recently had his  labs drawn at his PCP, will request copy.  4)  Weight/Diet:  His Body mass index is 32.08 kg/m.-candidate for some weight loss.  He is following with Jearld Fenton, CD for diabetes education.  No success in weight loss,  exercise, and carbohydrates information provided.  5) Chronic Care/Health Maintenance: -Patient on ACEI and Statin medications and encouraged to continue to follow up with Ophthalmology, Podiatrist at least yearly or according to recommendations, and advised to stay away from smoking. I have recommended yearly flu vaccine and pneumonia vaccination at least every 5 years; moderate intensity exercise for up to 150 minutes weekly; and  sleep for at least 7 hours a day.   He is advised to maintain close follow-up with his PMD  for primary care needs.    I spent  25 minutes in the care of the patient today including review of labs from [place], Lipids, Thyroid Function, Hematology (current and previous including abstractions from other facilities); face-to-face time discussing  his blood glucose readings/logs, discussing hypoglycemia and hyperglycemia episodes and symptoms, medications doses, his options of short and long term treatment based on the latest standards of care / guidelines;  discussion about incorporating lifestyle medicine;  and documenting the encounter. Risk reduction counseling performed per USPSTF guidelines to reduce obesity and cardiovascular risk factors.     Please refer to Patient Instructions for Blood Glucose Monitoring and Insulin/Medications Dosing Guide"  in media tab for additional information. Please  also refer to " Patient Self Inventory" in the Media  tab for reviewed elements of pertinent patient history.  Daniel Schaefer participated in the discussions, expressed understanding, and voiced agreement with the above plans.  All questions were answered to his satisfaction. he is encouraged to contact clinic should he have any questions or concerns prior to  his return visit.     Follow up plan: Return in  about 4 months (around 03/10/2023) for F/U with Pre-visit Labs, Meter/CGM/Logs, A1c here.  Rayetta Pigg, [facility] [address] [place], [place] [address] Phone: [phone] Fax: [phone]  11/09/2022, 12:18 PM

## 2022-11-14 ENCOUNTER — Other Ambulatory Visit: Payer: Self-pay | Admitting: Cardiology

## 2022-12-02 ENCOUNTER — Other Ambulatory Visit: Payer: Self-pay | Admitting: "Endocrinology

## 2022-12-06 NOTE — Progress Notes (Unsigned)
Daniel Schaefer, male    DOB: 1951/05/25    MRN: [id number]   Brief patient profile:  72 yowm  never smoker/ survived polio age 72 with L leg shorter  and CHF  referred  to pulmonary clinic in [place]  10/07/2022 by Dr Jimmye Norman for pna/ L effusion  in setting of chronic throat clearing   Teeth cleaning Dec 21st 2023   Acute onset 09/23/22 L cp > to ER 1/15 dx pna/ fluid >> rx zmax  >>  "sinuses really clear" / breathing better and less cp    History of Present Illness  10/07/2022  Pulmonary/ 1st office eval/ Daniel Schaefer / [place] [facility]  Chief Complaint  Patient presents with   Consult    Consult pleural effusion  Dyspnea:  fast walk or liftng heavy objects and moves them Cough: better / some throat clearing / white mucus  Sleep: onset had to sit up initially but now able lie flat bed either x 3-4 prior to OV   SABA use: none  02: none  Rec Stop lisinopril  Start olmesartan 20 mg one daily     L T centesis  10/11/2022  400 cc  nl glucose, wbc 1872 L>P  and low protein/ ldh with cytology >>> benign     10/25/2022  f/u ov/[place] office/Daniel Schaefer re: L pleural effusion  / cough   Chief Complaint  Patient presents with   Follow-up    Breathing doing well. Had CXR today   Dyspnea:  not limited  Cough: better  Sleeping: level bed / on side with one pillow SABA use: none  02: none  Covid status: vax x all/ infected x 2  Rec Ok to resume exercise   Covid 19 around Feb 15 hacking cough, no sob  12/07/2022  f/u ov/[place] office/Daniel Schaefer re: L pleural effusion  maint on no resp rx  Chief Complaint  Patient presents with   Follow-up    Feeling better since last ov. No new concerns   Dyspnea:  working out with silver sneakers/ [employer]  Cough: resolved  Sleeping: level bed one pillow/ no resp cc  SABA use: none  02: none      No obvious day to day or daytime variability or assoc excess/ purulent sputum or mucus plugs or hemoptysis or cp or chest tightness, subjective  wheeze or overt sinus or hb symptoms.   Sleeping  without nocturnal  or early am exacerbation  of respiratory  c/o's or need for noct saba. Also denies any obvious fluctuation of symptoms with weather or environmental changes or other aggravating or alleviating factors except as outlined above   No unusual exposure hx or h/o childhood pna/ asthma or knowledge of premature birth.  Current Allergies, Complete Past Medical History, Past Surgical History, Family History, and Social History were reviewed in [employer] record.  ROS  The following are not active complaints unless bolded Hoarseness, sore throat, dysphagia, dental problems, itching, sneezing,  nasal congestion or discharge of excess mucus or purulent secretions, ear ache,   fever, chills, sweats, unintended wt loss or wt gain, classically pleuritic or exertional cp,  orthopnea pnd or arm/hand swelling  or leg swelling, presyncope, palpitations, abdominal pain, anorexia, nausea, vomiting, diarrhea  or change in bowel habits or change in bladder habits, change in stools or change in urine, dysuria, hematuria,  rash, arthralgias, visual complaints, headache, numbness, weakness or ataxia or problems with walking or coordination,  change in mood or  memory.  Current Meds  Medication Sig   amLODipine (NORVASC) 5 MG tablet Take 1 tablet by mouth once daily   Azelastine HCl 137 MCG/SPRAY SOLN Place 2 sprays into both nostrils daily.   Blood Glucose Monitoring Suppl (ACCU-CHEK GUIDE ME) w/Device KIT 1 Piece by Does not apply route as directed.   Cholecalciferol (VITAMIN D3) 125 MCG (5000 UT) TABS Take 5,000 Units by mouth daily.    Flaxseed, Linseed, (FLAX SEEDS PO) Take by mouth.   furosemide (LASIX) 20 MG tablet Take 1 tablet by mouth once daily   GLOBAL EASE INJECT PEN NEEDLES 31G X 8 MM MISC USE 1 DAILY AS DIRECTED - RUN ON CASH $15 - PER PC.   glucose blood (ACCU-CHEK GUIDE) test strip USE 1 STRIP TO CHECK  GLUCOSE THREE TIMES DAILY   insulin glargine, 2 Unit Dial, (TOUJEO MAX SOLOSTAR) 300 UNIT/ML Solostar Pen INJECT 64 UNITS SUBCUTANEOUSLY AT BEDTIME   Insulin Pen Needle (B-D ULTRAFINE III SHORT PEN) 31G X 8 MM MISC 1 each by Does not apply route as directed.   Lancets MISC 1 each by Does not apply route as directed.   levocetirizine (XYZAL) 5 MG tablet SMARTSIG:1 Tablet(s) By Mouth Every Evening   melatonin 5 MG TABS Take 5 mg by mouth.   metFORMIN (GLUCOPHAGE) 500 MG tablet TAKE 1 TABLET BY MOUTH TWICE DAILY WITH A MEAL   metoprolol tartrate (LOPRESSOR) 25 MG tablet Take 0.5 tablets (12.5 mg total) by mouth 2 (two) times daily.   olmesartan (BENICAR) 40 MG tablet Take 1 tablet (40 mg total) by mouth daily.   omeprazole (PRILOSEC) 20 MG capsule Take 20 mg by mouth daily.    oxymetazoline (AFRIN) 0.05 % nasal spray Place 1 spray into both nostrils 2 (two) times daily as needed for congestion.   potassium chloride (KLOR-CON) 10 MEQ tablet TAKE 1 TABLET BY MOUTH DAILY   pravastatin (PRAVACHOL) 40 MG tablet TAKE 1 TABLET BY MOUTH AT BEDTIME   tadalafil (CIALIS) 20 MG tablet Take 20 mg by mouth daily as needed for erectile dysfunction.   tamsulosin (FLOMAX) 0.4 MG CAPS capsule Take 1 capsule (0.4 mg total) by mouth daily.   warfarin (COUMADIN) 5 MG tablet TAKE 1 TABLET BY MOUTH ONCE DAILY EXCEPT  1/2  TABLET  ON  WEDNESDAYS  OR  AS  DIRECTED                 Past Medical History:  Diagnosis Date   Atrial fibrillation [facility])    Reportedly diagnosed November 2011   Essential hypertension    History of kidney stones    Prostate cancer ([place])    Radiation implants   Type 2 diabetes mellitus ([place])         Objective:    wts  12/07/2022       224   10/25/22 227 lb 9.6 oz (103.2 kg)  10/07/22 228 lb (103.4 kg)  09/27/22 228 lb (103.4 kg)     Vital signs reviewed  12/07/2022  - Note at rest 02 sats  98% on RA   General appearance:    pleasant amb wm nad    HEENT : Oropharynx  clear        NECK :  without  apparent JVD/ palpable Nodes/TM    LUNGS: no acc muscle use,  Nl contour chest which is clear to A and P bilaterally without cough on insp or exp maneuvers   CV: IRIR   no s3 or murmur or increase in  P2, and no edema   ABD:  soft and nontender with nl inspiratory excursion in the supine position. No bruits or organomegaly appreciated   MS:  Nl gait/ ext warm without deformities Or obvious joint restrictions  calf tenderness, cyanosis or clubbing    SKIN: warm and dry without lesions    NEURO:  alert, approp, nl sensorium with  no motor or cerebellar deficits apparent.    CXR PA and Lateral:   12/07/2022 :    I personally reviewed images and impression is as follows:     Further clearing of effusion on L        Assessment

## 2022-12-07 ENCOUNTER — Ambulatory Visit: Payer: Medicare Other | Admitting: Internal Medicine

## 2022-12-07 ENCOUNTER — Encounter: Payer: Self-pay | Admitting: Internal Medicine

## 2022-12-07 ENCOUNTER — Ambulatory Visit (HOSPITAL_COMMUNITY)
Admission: RE | Admit: 2022-12-07 | Discharge: 2022-12-07 | Disposition: A | Discharge status: Home / Self Care | Payer: Medicare Other | Source: Ambulatory Visit | Attending: Internal Medicine | Admitting: Internal Medicine

## 2022-12-07 VITALS — BP 128/70 | HR 70 | Ht 70.5 in | Wt 224.8 lb

## 2022-12-07 DIAGNOSIS — J90 Pleural effusion, not elsewhere classified: Secondary | ICD-10-CM

## 2022-12-07 NOTE — Assessment & Plan Note (Signed)
Onset of L CP 09/23/22 though chest felt "congested" prior to that with last dental work 09/02/22  - Tcentesis on L  10/11/2022  400 cc  nl glucose, wbc 1872 L>P  and low protein/ ldh with cytology >>> benign - 10/25/2022 marked improvement on cxr vs prior to t centesis and post tcentesis pCXR - 12/07/2022 cxr further clearing on L > no f/u needed   Classic parapneumonic effusion, uncomplicated and no need for further f/u.         Each maintenance medication was reviewed in detail including emphasizing most importantly the difference between maintenance and prns and under what circumstances the prns are to be triggered using an action plan format where appropriate.  Total time for H and P, chart review, counseling,  and generating customized AVS unique to this office visit / same day charting = 24 min summary ov

## 2022-12-07 NOTE — Patient Instructions (Signed)
Please remember to go to the  x-ray department  @  [facility] for your tests - we will call you with the results when they are available     Pulmonary follow is as needed

## 2022-12-14 ENCOUNTER — Ambulatory Visit: Payer: Medicare Other | Attending: Cardiology | Admitting: *Deleted

## 2022-12-14 DIAGNOSIS — I4821 Permanent atrial fibrillation: Secondary | ICD-10-CM | POA: Diagnosis not present

## 2022-12-14 DIAGNOSIS — Z5181 Encounter for therapeutic drug level monitoring: Secondary | ICD-10-CM | POA: Diagnosis not present

## 2022-12-14 LAB — POCT INR: INR: 2.6 (ref 2.0–3.0)

## 2022-12-14 NOTE — Patient Instructions (Signed)
Continue warfarin 1 tablet daily except 1/2 tablet on Wednesdays.   Continue greens Recheck in 6 weeks  

## 2023-01-04 ENCOUNTER — Other Ambulatory Visit: Payer: Self-pay | Admitting: Nurse Practitioner

## 2023-01-11 DIAGNOSIS — E1121 Type 2 diabetes mellitus with diabetic nephropathy: Secondary | ICD-10-CM | POA: Diagnosis not present

## 2023-01-11 DIAGNOSIS — K219 Gastro-esophageal reflux disease without esophagitis: Secondary | ICD-10-CM | POA: Diagnosis not present

## 2023-01-11 DIAGNOSIS — I482 Chronic atrial fibrillation, unspecified: Secondary | ICD-10-CM | POA: Diagnosis not present

## 2023-01-11 DIAGNOSIS — I5032 Chronic diastolic (congestive) heart failure: Secondary | ICD-10-CM | POA: Diagnosis not present

## 2023-01-11 DIAGNOSIS — J309 Allergic rhinitis, unspecified: Secondary | ICD-10-CM | POA: Diagnosis not present

## 2023-01-24 ENCOUNTER — Other Ambulatory Visit: Payer: Self-pay | Admitting: "Endocrinology

## 2023-01-24 DIAGNOSIS — E1159 Type 2 diabetes mellitus with other circulatory complications: Secondary | ICD-10-CM

## 2023-01-25 ENCOUNTER — Ambulatory Visit: Payer: Medicare Other | Attending: Cardiology | Admitting: *Deleted

## 2023-01-25 DIAGNOSIS — Z5181 Encounter for therapeutic drug level monitoring: Secondary | ICD-10-CM | POA: Diagnosis not present

## 2023-01-25 DIAGNOSIS — I4821 Permanent atrial fibrillation: Secondary | ICD-10-CM

## 2023-01-25 LAB — POCT INR: INR: 2.1 (ref 2.0–3.0)

## 2023-01-25 NOTE — Patient Instructions (Signed)
Continue warfarin 1 tablet daily except 1/2 tablet on Wednesdays.   Continue greens Recheck in 6 weeks  

## 2023-01-29 ENCOUNTER — Other Ambulatory Visit: Payer: Self-pay | Admitting: Cardiology

## 2023-02-09 ENCOUNTER — Other Ambulatory Visit: Payer: Self-pay | Admitting: Cardiology

## 2023-02-15 ENCOUNTER — Ambulatory Visit: Payer: Medicare Other | Attending: Cardiology | Admitting: Cardiology

## 2023-02-15 ENCOUNTER — Encounter: Payer: Self-pay | Admitting: Cardiology

## 2023-02-15 VITALS — BP 132/80 | HR 53 | Ht 70.0 in | Wt 224.8 lb

## 2023-02-15 DIAGNOSIS — I4821 Permanent atrial fibrillation: Secondary | ICD-10-CM

## 2023-02-15 DIAGNOSIS — I10 Essential (primary) hypertension: Secondary | ICD-10-CM | POA: Diagnosis not present

## 2023-02-15 NOTE — Patient Instructions (Signed)
Medication Instructions:  Stop Taking Lopressor   *If you need a refill on your cardiac medications before your next appointment, please call your pharmacy*   Lab Work: NONE   If you have labs (blood work) drawn today and your tests are completely normal, you will receive your results only by: MyChart Message (if you have MyChart) OR A paper copy in the mail If you have any lab test that is abnormal or we need to change your treatment, we will call you to review the results.   Testing/Procedures: NONE    Follow-Up: At [facility], you and your health needs are our priority.  As part of our continuing mission to provide you with exceptional heart care, we have created designated Provider Care Teams.  These Care Teams include your primary Cardiologist (physician) and Advanced Practice Providers (APPs -  Physician Assistants and Nurse Practitioners) who all work together to provide you with the care you need, when you need it.  We recommend signing up for the patient portal called "MyChart".  Sign up information is provided on this After Visit Summary.  MyChart is used to connect with patients for Virtual Visits (Telemedicine).  Patients are able to view lab/test results, encounter notes, upcoming appointments, etc.  Non-urgent messages can be sent to your provider as well.   To learn more about what you can do with MyChart, go to [email or web address].    Your next appointment:   6 month(s)  Provider:   Nona Dell, MD    Other Instructions Thank you for choosing [facility]!

## 2023-02-15 NOTE — Progress Notes (Signed)
    Cardiology Office Note  Date: 02/15/2023   ID: Daniel Schaefer, DOB [birth year], MRN [id number]  History of Present Illness: Daniel Schaefer is a 72 y.o. male last seen in October 2023.  He is here for a routine visit.  Reports no palpitations or exertional chest pain.  He exercises at the gym 6 days a week, has also been trying to lose some weight.  We went over his medications and discussed stopping Lopressor completely given low resting heart rate in atrial fibrillation.  Question whether this may actually help some of his stamina as well.  He is on Coumadin with follow-up in the anticoagulation clinic.  Recent INR 2.1.  He does not report any spontaneous bleeding problems.  Physical Exam: VS:  BP 132/80   Pulse (!) 53   Ht 5\' 10"  (1.778 m)   Wt 224 lb 12.8 oz (102 kg)   SpO2 98%   BMI 32.26 kg/m , BMI Body mass index is 32.26 kg/m.  Wt Readings from Last 3 Encounters:  02/15/23 224 lb 12.8 oz (102 kg)  12/07/22 224 lb 12.8 oz (102 kg)  11/09/22 226 lb 12.8 oz (102.9 kg)    General: Patient appears comfortable at rest. HEENT: Conjunctiva and lids normal. Neck: Supple, no elevated JVP or carotid bruits. Lungs: Clear to auscultation, nonlabored breathing at rest. Cardiac: Irregular without gallop. Extremities: No pitting edema.  ECG:  An ECG dated 07/01/2022 was personally reviewed today and demonstrated:  Atrial fibrillation with PVC and nonspecific T wave changes.  Labwork: 03/29/2022: ALT 21; AST 16 07/01/2022: TSH 1.320 09/27/2022: BUN 23; Creatinine, Ser 1.39; Hemoglobin 11.9; Platelets 269; Potassium 3.6; Sodium 134     Component Value Date/Time   CHOL 149 03/29/2022 0000   TRIG 125 03/29/2022 0000   HDL 44 03/29/2022 0000   LDLCALC 80 03/29/2022 0000   Other Studies Reviewed Today:  Echocardiogram 07/06/2022:  1. Left ventricular ejection fraction, by estimation, is 65 to 70%. The  left ventricle has normal function. The left ventricle has no regional   wall motion abnormalities. Left ventricular diastolic parameters are  indeterminate.   2. Right ventricular systolic function is normal. The right ventricular  size is normal. Tricuspid regurgitation signal is inadequate for assessing  PA pressure.   3. Left atrial size was mildly dilated.   4. Right atrial size was mildly dilated.   5. The mitral valve is normal in structure. No evidence of mitral valve  regurgitation. No evidence of mitral stenosis.   6. The aortic valve is tricuspid. Aortic valve regurgitation is not  visualized. No aortic stenosis is present.   7. The inferior vena cava is dilated in size with >50% respiratory  variability, suggesting right atrial pressure of 8 mmHg.   Assessment and Plan:  1.  Permanent atrial fibrillation with CHA2DS2-VASc score of 3.  He remains on Coumadin with follow-up in the anticoagulation clinic.  Plan to stop Lopressor completely at this point given low resting heart rate.  2.  Essential hypertension.  He continues on Norvasc and Benicar.  Disposition:  Follow up  6 months.  Signed, Jonelle Sidle, M.D., F.A.C.C. [facility] at [facility]

## 2023-02-16 ENCOUNTER — Other Ambulatory Visit: Payer: Self-pay | Admitting: Cardiology

## 2023-02-21 ENCOUNTER — Other Ambulatory Visit: Payer: Self-pay | Admitting: Cardiology

## 2023-02-22 NOTE — Telephone Encounter (Signed)
Refill request for warfarin:  Last INR was 2.1 on 01/25/23 Next INR due 03/08/23 LOV was 02/15/23  Refill approved.

## 2023-03-03 DIAGNOSIS — E1159 Type 2 diabetes mellitus with other circulatory complications: Secondary | ICD-10-CM | POA: Diagnosis not present

## 2023-03-03 DIAGNOSIS — Z794 Long term (current) use of insulin: Secondary | ICD-10-CM | POA: Diagnosis not present

## 2023-03-04 LAB — COMPREHENSIVE METABOLIC PANEL
ALT: 17 IU/L (ref 0–44)
AST: 22 IU/L (ref 0–40)
Albumin: 4 g/dL (ref 3.8–4.8)
Alkaline Phosphatase: 64 IU/L (ref 44–121)
BUN/Creatinine Ratio: 20 (ref 10–24)
BUN: 24 mg/dL (ref 8–27)
Bilirubin Total: 0.3 mg/dL (ref 0.0–1.2)
CO2: 23 mmol/L (ref 20–29)
Calcium: 9.1 mg/dL (ref 8.6–10.2)
Chloride: 104 mmol/L (ref 96–106)
Creatinine, Ser: 1.23 mg/dL (ref 0.76–1.27)
Globulin, Total: 2.3 g/dL (ref 1.5–4.5)
Glucose: 116 mg/dL — ABNORMAL HIGH (ref 70–99)
Potassium: 4.5 mmol/L (ref 3.5–5.2)
Sodium: 142 mmol/L (ref 134–144)
Total Protein: 6.3 g/dL (ref 6.0–8.5)
eGFR: 62 mL/min/{1.73_m2} (ref >59)

## 2023-03-04 LAB — LIPID PANEL
Chol/HDL Ratio: 3 ratio (ref 0.0–5.0)
Cholesterol, Total: 123 mg/dL (ref 100–199)
HDL: 41 mg/dL (ref >39)
LDL Chol Calc (NIH): 55 mg/dL (ref 0–99)
Triglycerides: 161 mg/dL — ABNORMAL HIGH (ref 0–149)
VLDL Cholesterol Cal: 27 mg/dL (ref 5–40)

## 2023-03-08 ENCOUNTER — Other Ambulatory Visit: Payer: Self-pay | Admitting: "Endocrinology

## 2023-03-08 ENCOUNTER — Ambulatory Visit: Payer: Medicare Other | Attending: Cardiology | Admitting: *Deleted

## 2023-03-08 DIAGNOSIS — Z5181 Encounter for therapeutic drug level monitoring: Secondary | ICD-10-CM | POA: Diagnosis not present

## 2023-03-08 DIAGNOSIS — I4821 Permanent atrial fibrillation: Secondary | ICD-10-CM

## 2023-03-08 LAB — POCT INR: INR: 2 (ref 2.0–3.0)

## 2023-03-08 NOTE — Patient Instructions (Signed)
Continue warfarin 1 tablet daily except 1/2 tablet on Wednesdays.   Continue greens Recheck in 6 weeks  

## 2023-03-10 ENCOUNTER — Encounter: Payer: Self-pay | Admitting: "Endocrinology

## 2023-03-10 ENCOUNTER — Ambulatory Visit: Payer: Medicare Other | Admitting: "Endocrinology

## 2023-03-10 VITALS — BP 132/78 | HR 72 | Ht 70.0 in | Wt 222.0 lb

## 2023-03-10 DIAGNOSIS — E782 Mixed hyperlipidemia: Secondary | ICD-10-CM

## 2023-03-10 DIAGNOSIS — E1159 Type 2 diabetes mellitus with other circulatory complications: Secondary | ICD-10-CM

## 2023-03-10 DIAGNOSIS — Z794 Long term (current) use of insulin: Secondary | ICD-10-CM | POA: Insufficient documentation

## 2023-03-10 DIAGNOSIS — I10 Essential (primary) hypertension: Secondary | ICD-10-CM

## 2023-03-10 LAB — POCT GLYCOSYLATED HEMOGLOBIN (HGB A1C): HbA1c, POC (controlled diabetic range): 6.7 % (ref 0.0–7.0)

## 2023-03-10 NOTE — Progress Notes (Signed)
03/10/2023  Endocrinology follow-up note   Subjective:    Patient ID: Daniel Schaefer, male    DOB: 1951-01-15,    Past Medical History:  Diagnosis Date   Atrial fibrillation [facility])    Reportedly diagnosed November 2011   Essential hypertension    History of kidney stones    Prostate cancer [facility])    Radiation implants   Type 2 diabetes mellitus (HCC)    Past Surgical History:  Procedure Laterality Date   Gold seed placement     INGUINAL HERNIA REPAIR Left 07/02/2020   Procedure: LEFT INGUINAL HERNIORRHAPY WITH MESH;  Surgeon: Franky Macho, MD;  Location: [facility];  Service: General;  Laterality: Left;   LITHOTRIPSY     PROSTATE BIOPSY     TRIGGER FINGER RELEASE Left    ring finger   URETEROSCOPY WITH HOLMIUM LASER LITHOTRIPSY     Social History   Socioeconomic History   Marital status: Divorced    Spouse name: Not on file   Number of children: Not on file   Years of education: Not on file   Highest education level: Not on file  Occupational History   Not on file  Tobacco Use   Smoking status: Never   Smokeless tobacco: Never  Vaping Use   Vaping Use: Never used  Substance and Sexual Activity   Alcohol use: No    Alcohol/week: 0.0 standard drinks of alcohol   Drug use: No   Sexual activity: Yes  Other Topics Concern   Not on file  Social History Narrative   Not on file   Social Determinants of Health   Financial Resource Strain: Not on file  Food Insecurity: Not on file  Transportation Needs: Not on file  Physical Activity: Not on file  Stress: Not on file  Social Connections: Not on file   Outpatient Encounter Medications as of 03/10/2023  Medication Sig   amLODipine (NORVASC) 5 MG tablet Take 1 tablet by mouth once daily   Azelastine HCl 137 MCG/SPRAY SOLN Place 2 sprays into both nostrils daily.   Blood Glucose Monitoring Suppl (ACCU-CHEK GUIDE ME) w/Device KIT 1 Piece by Does not apply route as directed.   Cholecalciferol (VITAMIN D3) 125 MCG  (5000 UT) TABS Take 5,000 Units by mouth daily.    Flaxseed, Linseed, (FLAX SEEDS PO) Take by mouth.   furosemide (LASIX) 20 MG tablet Take 1 tablet by mouth once daily   GLOBAL EASE INJECT PEN NEEDLES 31G X 8 MM MISC USE 1 DAILY AS DIRECTED - RUN ON CASH $15 - PER PC.   glucose blood (ACCU-CHEK GUIDE) test strip USE 1 STRIP TO CHECK GLUCOSE THREE TIMES DAILY   insulin glargine, 2 Unit Dial, (TOUJEO MAX SOLOSTAR) 300 UNIT/ML Solostar Pen INJECT 64 UNITS SUBCUTANEOUSLY AT BEDTIME   Insulin Pen Needle (B-D ULTRAFINE III SHORT PEN) 31G X 8 MM MISC 1 each by Does not apply route as directed.   Lancets MISC 1 each by Does not apply route as directed.   levocetirizine (XYZAL) 5 MG tablet SMARTSIG:1 Tablet(s) By Mouth Every Evening   melatonin 5 MG TABS Take 5 mg by mouth.   metFORMIN (GLUCOPHAGE) 500 MG tablet TAKE 1 TABLET BY MOUTH TWICE DAILY WITH A MEAL   olmesartan (BENICAR) 40 MG tablet Take 1 tablet (40 mg total) by mouth daily.   omeprazole (PRILOSEC) 20 MG capsule Take 20 mg by mouth daily.    oxymetazoline (AFRIN) 0.05 % nasal spray Place 1 spray into both nostrils  2 (two) times daily as needed for congestion.   potassium chloride (KLOR-CON) 10 MEQ tablet TAKE 1 TABLET BY MOUTH DAILY   pravastatin (PRAVACHOL) 40 MG tablet TAKE 1 TABLET BY MOUTH AT BEDTIME   tadalafil (CIALIS) 20 MG tablet Take 20 mg by mouth daily as needed for erectile dysfunction.   tamsulosin (FLOMAX) 0.4 MG CAPS capsule Take 1 capsule (0.4 mg total) by mouth daily.   warfarin (COUMADIN) 5 MG tablet TAKE 1 TABLET BY MOUTH ONCE DAILY EXCEPT  1/2  TABLET  ON  WEDNESDAYS  OR  AS  DIRECTED   No facility-administered encounter medications on file as of 03/10/2023.   ALLERGIES: Allergies  Allergen Reactions   Bactrim [Sulfamethoxazole-Trimethoprim] Other (See Comments)    Thin his blood really bad   Other     Cannot mix warfarin and bactrim    [name] Flextouch [Insulin Degludec] Rash   VACCINATION STATUS: Immunization  History  Administered Date(s) Administered   [employer] Vaccination 10/04/2019, 11/05/2019    Diabetes He presents for his follow-up diabetic visit. He has type 2 diabetes mellitus. Onset time: He was diagnosed at approximate age of 13 years. His disease course has been improving. There are no hypoglycemic associated symptoms. There are no diabetic associated symptoms. There are no hypoglycemic complications. Symptoms are improving. Diabetic complications include nephropathy. Risk factors for coronary artery disease include hypertension, dyslipidemia, sedentary lifestyle, male sex and diabetes mellitus. Current diabetic treatment includes oral agent (monotherapy) and insulin injections. He is compliant with treatment most of the time. His weight is fluctuating minimally. He is following a generally unhealthy diet. When asked about meal planning, he reported none. He has not had a previous visit with a dietitian. He rarely participates in exercise. His home blood glucose trend is decreasing steadily. His breakfast blood glucose range is generally 130-140 mg/dl. His bedtime blood glucose range is generally 140-180 mg/dl. His overall blood glucose range is 140-180 mg/dl. (He comes with his logs and meter showing average fasting blood glucose between 128-136 mg/day.  His point-of-care A1c is 6.7% improving from 7.8%.  No hypoglycemia was documented.   ) An ACE inhibitor/angiotensin II receptor blocker is being taken. He does not see a podiatrist.Eye exam is current.  Hypertension This is a chronic problem. The current episode started more than 1 year ago. The problem is unchanged. The problem is controlled. There are no associated agents to hypertension. Risk factors for coronary artery disease include diabetes mellitus, dyslipidemia, male gender and sedentary lifestyle. Past treatments include ACE inhibitors, calcium channel blockers, diuretics and beta blockers. The current treatment provides  moderate improvement. There are no compliance problems.  Hypertensive end-organ damage includes kidney disease. Identifiable causes of hypertension include chronic renal disease.  Hyperlipidemia This is a chronic problem. The current episode started more than 1 year ago. The problem is controlled. Recent lipid tests were reviewed and are normal. Exacerbating diseases include chronic renal disease and diabetes. Factors aggravating his hyperlipidemia include beta blockers. Current antihyperlipidemic treatment includes statins. The current treatment provides moderate improvement of lipids. There are no compliance problems.  Risk factors for coronary artery disease include dyslipidemia, diabetes mellitus, hypertension, male sex and a sedentary lifestyle.    Review of systems  Constitutional: + Minimally fluctuating body weight,  current Body mass index is 31.85 kg/m. , no fatigue, no subjective hyperthermia, no subjective hypothermia    Objective:    BP 132/78   Pulse 72   Ht 5\' 10"  (1.778 m)   Wt  222 lb (100.7 kg)   BMI 31.85 kg/m   Wt Readings from Last 3 Encounters:  03/10/23 222 lb (100.7 kg)  02/15/23 224 lb 12.8 oz (102 kg)  12/07/22 224 lb 12.8 oz (102 kg)    BP Readings from Last 3 Encounters:  03/10/23 132/78  02/15/23 132/80  12/07/22 128/70     Physical Exam- Limited  Constitutional:  Body mass index is 31.85 kg/m. , not in acute distress, normal state of mind Eyes:  EOMI, no exophthalmos     Results for orders placed or performed in visit on 03/10/23  HgB A1c  Result Value Ref Range   Hemoglobin A1C     HbA1c POC (<> result, manual entry)     HbA1c, POC (prediabetic range)     HbA1c, POC (controlled diabetic range) 6.7 0.0 - 7.0 %   Complete Blood Count (Most recent): Lab Results  Component Value Date   WBC 10.2 09/27/2022   HGB 11.9 (L) 09/27/2022   HCT 38.5 (L) 09/27/2022   MCV 76.4 (L) 09/27/2022   PLT 269 09/27/2022   Chemistry (most recent): Lab  Results  Component Value Date   NA 142 03/03/2023   K 4.5 03/03/2023   CL 104 03/03/2023   CO2 23 03/03/2023   BUN 24 03/03/2023   CREATININE 1.23 03/03/2023   Lipid Panel     Component Value Date/Time   CHOL 123 03/03/2023 1149   TRIG 161 (H) 03/03/2023 1149   HDL 41 03/03/2023 1149   CHOLHDL 3.0 03/03/2023 1149   LDLCALC 55 03/03/2023 1149    Assessment & Plan:   1) Type 2 diabetes mellitus with stage 3 renal insufficiency, with long-term current use of insulin (HCC)  He comes with his logs and meter showing average fasting blood glucose between 128-136 mg/day.  His point-of-care A1c is 6.7% improving from 7.8%.  No hypoglycemia was documented.    -Patient remains at a high risk for more acute and chronic complications of diabetes which include CAD, CVA, CKD, retinopathy, and neuropathy. These are all discussed in detail with the patient.  -Recent labs reviewed showing improved renal function.  - Nutritional counseling repeated at each appointment due to patients tendency to fall back in to old habits.  - he acknowledges that there is a room for improvement in his food and drink choices. - Suggestion is made for him to avoid simple carbohydrates  from his diet including Cakes, Sweet Desserts, Ice Cream, Soda (diet and regular), Sweet Tea, Candies, Chips, Cookies, Store Bought Juices, Alcohol , Artificial Sweeteners,  Coffee Creamer, and "Sugar-free" Products, Lemonade. This will help patient to have more stable blood glucose profile and potentially avoid unintended weight gain.   The following Lifestyle Medicine recommendations according to American College of Lifestyle Medicine  [facility]) were discussed and and offered to patient and he  agrees to start the journey:  A. Whole Foods, Plant-Based Nutrition comprising of fruits and vegetables, plant-based proteins, whole-grain carbohydrates was discussed in detail with the patient.   A list for source of those nutrients were also  provided to the patient.  Patient will use only water or unsweetened tea for hydration. B.  The need to stay away from risky substances including alcohol, smoking; obtaining 7 to 9 hours of restorative sleep, at least 150 minutes of moderate intensity exercise weekly, the importance of healthy social connections.   -He is following with Norm Salt, CDE for diabetes education.  - He presents with controlled glycemic profile  and on target A1c of 6.7%.  He is advised to continue Toujeo 64 units nightly, associated with monitoring of blood glucose twice a day-daily before breakfast and at bedtime.       He worries about cost of medications, previously declined offer for Trulicity/Ozempic weekly injections.   He has sulfa allergy, not a candidate for glipizide.  He continues to tolerate regular strength metformin.  I advised him to continue metformin 500 mg p.o. twice daily.    This patient will benefit from a CGM.  I gave him options of freestyle libre or Dexcom.  He will research and call back for prescriptions if he is interested.   -In the meantime, he is encouraged to continue monitoring blood glucose twice daily, before breakfast and before bed, and to call the clinic if he has readings less than 70 or greater than 150 mg per DL 3 days in a week at fasting.    - He is generally hesitant to add medications.  - Patient specific target  for A1c; LDL, HDL, Triglycerides, and  Waist Circumference were discussed in detail.  2) BP/HTN: His blood pressure is controlled to target.  He is advised to continue Norvasc 5 mg po daily, Lasix 20 mg po daily, Lisinopril 20 mg po daily, and Metoprolol 25 mg po twice daily.  3) Lipids/HPL:  His most recent lipid panel showed significant improvement in his LDL at 55.  He is advised to continue pravastatin 40 mg p.o. daily at bedtime.    Side effects and precautions discussed with him.  He recently had his labs drawn at his PCP, will request copy.  4)   Weight/Diet:  His Body mass index is 31.85 kg/m.-candidate for some weight loss.  He is following with Norm Salt, CD for diabetes education.  No success in weight loss,  exercise, and carbohydrates information provided.  5) Chronic Care/Health Maintenance: -Patient on ACEI and Statin medications and encouraged to continue to follow up with Ophthalmology, Podiatrist at least yearly or according to recommendations, and advised to stay away from smoking. I have recommended yearly flu vaccine and pneumonia vaccination at least every 5 years; moderate intensity exercise for up to 150 minutes weekly; and  sleep for at least 7 hours a day.   He is advised to maintain close follow-up with his PMD  for primary care needs.   I spent  26  minutes in the care of the patient today including review of labs from CMP, Lipids, Thyroid Function, Hematology (current and previous including abstractions from other facilities); face-to-face time discussing  his blood glucose readings/logs, discussing hypoglycemia and hyperglycemia episodes and symptoms, medications doses, his options of short and long term treatment based on the latest standards of care / guidelines;  discussion about incorporating lifestyle medicine;  and documenting the encounter. Risk reduction counseling performed per USPSTF guidelines to reduce obesity and cardiovascular risk factors.     Please refer to Patient Instructions for Blood Glucose Monitoring and Insulin/Medications Dosing Guide"  in media tab for additional information. Please  also refer to " Patient Self Inventory" in the Media  tab for reviewed elements of pertinent patient history.  Daniel Schaefer participated in the discussions, expressed understanding, and voiced agreement with the above plans.  All questions were answered to his satisfaction. he is encouraged to contact clinic should he have any questions or concerns prior to his return visit.    Follow up plan: Return  in about 4 months (around 07/10/2023) for  Bring Meter/CGM Device/Logs- A1c in Office.  Ronny Bacon, [facility] [address] [place], [place] [address] Phone: [phone] Fax: [phone]  03/10/2023, 1:03 PM

## 2023-03-10 NOTE — Patient Instructions (Signed)

## 2023-03-15 ENCOUNTER — Other Ambulatory Visit: Payer: Medicare Other

## 2023-03-15 DIAGNOSIS — Z8546 Personal history of malignant neoplasm of prostate: Secondary | ICD-10-CM

## 2023-03-16 ENCOUNTER — Other Ambulatory Visit: Payer: Medicare Other

## 2023-03-16 LAB — PSA: Prostate Specific Ag, Serum: 0.2 ng/mL (ref 0.0–4.0)

## 2023-03-24 ENCOUNTER — Encounter: Payer: Self-pay | Admitting: Urology

## 2023-03-24 ENCOUNTER — Ambulatory Visit: Payer: Medicare Other | Admitting: Urology

## 2023-03-24 VITALS — BP 153/72 | HR 76 | Ht 70.0 in | Wt 222.0 lb

## 2023-03-24 DIAGNOSIS — R3912 Poor urinary stream: Secondary | ICD-10-CM

## 2023-03-24 DIAGNOSIS — N200 Calculus of kidney: Secondary | ICD-10-CM

## 2023-03-24 DIAGNOSIS — E291 Testicular hypofunction: Secondary | ICD-10-CM

## 2023-03-24 DIAGNOSIS — N401 Enlarged prostate with lower urinary tract symptoms: Secondary | ICD-10-CM | POA: Diagnosis not present

## 2023-03-24 DIAGNOSIS — N138 Other obstructive and reflux uropathy: Secondary | ICD-10-CM

## 2023-03-24 DIAGNOSIS — Z8546 Personal history of malignant neoplasm of prostate: Secondary | ICD-10-CM

## 2023-03-24 DIAGNOSIS — N5201 Erectile dysfunction due to arterial insufficiency: Secondary | ICD-10-CM

## 2023-03-24 MED ORDER — TAMSULOSIN HCL 0.4 MG PO CAPS: 0.4000 mg | ORAL_CAPSULE | Freq: Every day | ORAL | 3 refills | Status: DC

## 2023-03-24 NOTE — Progress Notes (Signed)
Subjective:  1. Renal stones   2. History of adenocarcinoma of prostate   3. BPH with urinary obstruction   4. Weak urinary stream   5. Hypogonadism in male   75. Erectile dysfunction due to arterial insufficiency   7. Renal calculus     03/24/23: Daniel Schaefer returns today in f/u.  His PSA remains stable at 0.2 20+ years s/p EXRT and a seed impant.  He didn't go for the KUB ordered for prior to this visit for the 12mm LLP stone but he had a CT in 1/24 that showed a stable 12mm LLP stone and a 7mm RLP stone.  He has a history of ED and has tadalafil but doesn't use it.  He had a left inguinal hernia repair last year and he has had some penile retraction since.  He has hypogonadism with the last testosterone level at 165 in 7/23. He is on tamsulosin for LUTS.   03/25/22: Daniel Schaefer returns today in f/u.  His PSA is stable at 0.2 20 years out from EXRT with seeds.  His testosterone level is down some to 165. He has primary testicular dysfunction with elevated gonadotropins.  He remains on tamsulsoin for his BPH with LUTS.  He has had increased nocturia over the last 2-3 months and gets up 3-4 x.  He has a reduced stream.  He has intermittent mild urge with rare UUI.  He has had no hematuria or dysuria.  He started drinking up to 4 cups of coffee in the morning about the time he started having increased nocturia.  He has no change in the 12mm LLP stone.  There is a 12mm probable pill shadow in the LLQ. He had a similar shadow in the RUQ on his prior film.  He has been on tadalafil in the past for ED but he hasn't used it in a while.   09/24/21: Daniel Schaefer returns today in f/u.  His PSA  remains 0.2.   He has a history of prostate cancer treated with EXRT and seeds in 2003.  He has a history of ED and used tadalafil with a partial response but he isn't on it currently.  His testosterone level is up slightly at 201.   His FSH and LH from 06/18/21 were elevated at 41 and 24 and his prolactin is 28.8.  He has no gynecomastia,  headache or significant changes in eye sight or sense of smell.  He still has had some fatigue.  He is voiding well with an IPSS of 9.  He remains on tamsulosin.  He has a reduced stream  and nocturia x 2.  His IPSS is 7-8 with a weak stream and nocturia 1-2x.    He has a LLP stone that has been enlarging and was 5mm in 7/20.  It was 10.49mm in 1/22 and is now 10.93mm in 4/22 and it is was 11mm in 10/22.  He didn't get the films I ordered for this visit.  He has had no flank pain or hematuria.         IPSS     Row Name 03/24/23 1100         International Prostate Symptom Score   How often have you had the sensation of not emptying your bladder? Not at All     How often have you had to urinate less than every two hours? Less than 1 in 5 times     How often have you found you stopped and started again several times  when you urinated? Not at All     How often have you found it difficult to postpone urination? Not at All     How often have you had a weak urinary stream? Almost always     How often have you had to strain to start urination? Not at All     How many times did you typically get up at night to urinate? 2 Times     Total IPSS Score 8       Quality of Life due to urinary symptoms   If you were to spend the rest of your life with your urinary condition just the way it is now how would you feel about that? Mixed                ROS:  ROS:  A complete review of systems was performed.  All systems are negative except for pertinent findings as noted.   Review of Systems  Constitutional:  Weight loss: with increased activity.  HENT:  Positive for congestion.   Gastrointestinal: Negative.   Genitourinary:  Negative for flank pain and hematuria.  Musculoskeletal: Negative.     Allergies  Allergen Reactions   Bactrim [Sulfamethoxazole-Trimethoprim] Other (See Comments)    Thin his blood really bad   Other     Cannot mix warfarin and bactrim    Tresiba Flextouch  [Insulin Degludec] Rash    Outpatient Encounter Medications as of 03/24/2023  Medication Sig   amLODipine (NORVASC) 5 MG tablet Take 1 tablet by mouth once daily   Azelastine HCl 137 MCG/SPRAY SOLN Place 2 sprays into both nostrils daily.   Blood Glucose Monitoring Suppl (ACCU-CHEK GUIDE ME) w/Device KIT 1 Piece by Does not apply route as directed.   Cholecalciferol (VITAMIN D3) 125 MCG (5000 UT) TABS Take 5,000 Units by mouth daily.    Flaxseed, Linseed, (FLAX SEEDS PO) Take by mouth.   furosemide (LASIX) 20 MG tablet Take 1 tablet by mouth once daily   GLOBAL EASE INJECT PEN NEEDLES 31G X 8 MM MISC USE 1 DAILY AS DIRECTED - RUN ON CASH $15 - PER PC.   glucose blood (ACCU-CHEK GUIDE) test strip USE 1 STRIP TO CHECK GLUCOSE THREE TIMES DAILY   insulin glargine, 2 Unit Dial, (TOUJEO MAX SOLOSTAR) 300 UNIT/ML Solostar Pen INJECT 64 UNITS SUBCUTANEOUSLY AT BEDTIME   Insulin Pen Needle (B-D ULTRAFINE III SHORT PEN) 31G X 8 MM MISC 1 each by Does not apply route as directed.   Lancets MISC 1 each by Does not apply route as directed.   levocetirizine (XYZAL) 5 MG tablet SMARTSIG:1 Tablet(s) By Mouth Every Evening   melatonin 5 MG TABS Take 5 mg by mouth.   metFORMIN (GLUCOPHAGE) 500 MG tablet TAKE 1 TABLET BY MOUTH TWICE DAILY WITH A MEAL   olmesartan (BENICAR) 40 MG tablet Take 1 tablet (40 mg total) by mouth daily.   Omega-3 Fat Ac-Cholecalciferol (SUPERIOR OMEGA3 W/ VITAMIN D PO) Take by mouth daily.   omeprazole (PRILOSEC) 20 MG capsule Take 20 mg by mouth daily.    oxymetazoline (AFRIN) 0.05 % nasal spray Place 1 spray into both nostrils 2 (two) times daily as needed for congestion.   potassium chloride (KLOR-CON) 10 MEQ tablet TAKE 1 TABLET BY MOUTH DAILY   pravastatin (PRAVACHOL) 40 MG tablet TAKE 1 TABLET BY MOUTH AT BEDTIME   tadalafil (CIALIS) 20 MG tablet Take 20 mg by mouth daily as needed for erectile dysfunction.   warfarin (COUMADIN) 5 MG  tablet TAKE 1 TABLET BY MOUTH ONCE DAILY  EXCEPT  1/2  TABLET  ON  WEDNESDAYS  OR  AS  DIRECTED   [DISCONTINUED] tamsulosin (FLOMAX) 0.4 MG CAPS capsule Take 1 capsule (0.4 mg total) by mouth daily.   tamsulosin (FLOMAX) 0.4 MG CAPS capsule Take 1 capsule (0.4 mg total) by mouth daily.   No facility-administered encounter medications on file as of 03/24/2023.    Past Medical History:  Diagnosis Date   Atrial fibrillation [facility])    Reportedly diagnosed November 2011   Essential hypertension    History of kidney stones    Prostate cancer [facility])    Radiation implants   Type 2 diabetes mellitus (HCC)     Past Surgical History:  Procedure Laterality Date   Gold seed placement     INGUINAL HERNIA REPAIR Left 07/02/2020   Procedure: LEFT INGUINAL HERNIORRHAPY WITH MESH;  Surgeon: Franky Macho, MD;  Location: [facility];  Service: General;  Laterality: Left;   LITHOTRIPSY     PROSTATE BIOPSY     TRIGGER FINGER RELEASE Left    ring finger   URETEROSCOPY WITH HOLMIUM LASER LITHOTRIPSY      Social History   Socioeconomic History   Marital status: Divorced    Spouse name: Not on file   Number of children: Not on file   Years of education: Not on file   Highest education level: Not on file  Occupational History   Not on file  Tobacco Use   Smoking status: Never   Smokeless tobacco: Never  Vaping Use   Vaping status: Never Used  Substance and Sexual Activity   Alcohol use: No    Alcohol/week: 0.0 standard drinks of alcohol   Drug use: No   Sexual activity: Yes  Other Topics Concern   Not on file  Social History Narrative   Not on file   Social Determinants of Health   Financial Resource Strain: Not on file  Food Insecurity: Not on file  Transportation Needs: Not on file  Physical Activity: Not on file  Stress: Not on file  Social Connections: Not on file  Intimate Partner Violence: Not on file    Family History  Problem Relation Age of Onset   Cancer Father 22       Laryngeal   Lymphoma Mother 52        Objective: Vitals:   03/24/23 1114 03/24/23 1116  BP: (!) 154/82 (!) 153/72  Pulse: 87 76     Physical Exam Vitals reviewed.  Constitutional:      Appearance: Normal appearance.  Neurological:     Mental Status: He is alert.     Lab Results:  No results found for this or any previous visit (from the past 24 hour(s)).   BMET No results for input(s): "NA", "K", "CL", "CO2", "GLUCOSE", "BUN", "CREATININE", "CALCIUM" in the last 72 hours. PSA  Lab Results  Component Value Date   PSA1 0.2 03/15/2023   PSA1 0.2 03/18/2022   PSA1 0.2 09/17/2021   CT from 1/24 films and report reviewed.     Studies/Results: No results found. No results found.    Assessment & Plan:  History of prostate cancer.  PSA is stable.  Return in a year.   BPH with BOO and mild LUTS.   I refilled the tamsulosin.  Testicular atrophy.  His T level is 165.  I discussed TRT but he would like to hold off.   ED.  He hasn't been using  the tadalafil and hasn't been active.   Renal stone.  He has stable 12mm LLP stone or stone cluster and smaller RLP stone.  I will reassess with a KUB in 1 year.    Meds ordered this encounter  Medications   tamsulosin (FLOMAX) 0.4 MG CAPS capsule    Sig: Take 1 capsule (0.4 mg total) by mouth daily.    Dispense:  90 capsule    Refill:  3      Orders Placed This Encounter  Procedures   DG Abd 1 View    Standing Status:   Future    Standing Expiration Date:   03/23/2024    Order Specific Question:   Reason for Exam (SYMPTOM  OR DIAGNOSIS REQUIRED)    Answer:   renal stones    Order Specific Question:   Preferred imaging location?    Answer:   [facility]    Order Specific Question:   Radiology Contrast Protocol - do NOT remove file path    Answer:   [email or web address]   Urinalysis, Routine w reflex microscopic   PSA    Standing Status:   Future    Standing Expiration Date:   03/23/2024       Return in about 1 year (around 03/23/2024) for KUB and PSA prior to f/u. Marland Kitchen   CC: Toma Deiters, MD  Dr. Maryella Shivers    Bjorn Pippin 03/25/2023 Patient ID: Cecilie Kicks, male   DOB: 06-Nov-1950, 72 y.o.   MRN: [id number]

## 2023-03-31 ENCOUNTER — Encounter (HOSPITAL_COMMUNITY): Payer: Self-pay

## 2023-03-31 DIAGNOSIS — Z7984 Long term (current) use of oral hypoglycemic drugs: Secondary | ICD-10-CM | POA: Diagnosis not present

## 2023-03-31 DIAGNOSIS — N281 Cyst of kidney, acquired: Secondary | ICD-10-CM | POA: Diagnosis not present

## 2023-03-31 DIAGNOSIS — E1151 Type 2 diabetes mellitus with diabetic peripheral angiopathy without gangrene: Secondary | ICD-10-CM | POA: Diagnosis not present

## 2023-03-31 DIAGNOSIS — Z79899 Other long term (current) drug therapy: Secondary | ICD-10-CM | POA: Diagnosis not present

## 2023-03-31 DIAGNOSIS — E785 Hyperlipidemia, unspecified: Secondary | ICD-10-CM | POA: Diagnosis not present

## 2023-03-31 DIAGNOSIS — K8020 Calculus of gallbladder without cholecystitis without obstruction: Secondary | ICD-10-CM | POA: Diagnosis not present

## 2023-03-31 DIAGNOSIS — I480 Paroxysmal atrial fibrillation: Secondary | ICD-10-CM | POA: Diagnosis not present

## 2023-03-31 DIAGNOSIS — N132 Hydronephrosis with renal and ureteral calculous obstruction: Secondary | ICD-10-CM | POA: Diagnosis not present

## 2023-03-31 DIAGNOSIS — I5032 Chronic diastolic (congestive) heart failure: Secondary | ICD-10-CM | POA: Diagnosis not present

## 2023-03-31 DIAGNOSIS — R079 Chest pain, unspecified: Secondary | ICD-10-CM | POA: Diagnosis not present

## 2023-03-31 DIAGNOSIS — N23 Unspecified renal colic: Secondary | ICD-10-CM | POA: Diagnosis not present

## 2023-03-31 DIAGNOSIS — N210 Calculus in bladder: Secondary | ICD-10-CM | POA: Diagnosis not present

## 2023-03-31 DIAGNOSIS — I110 Hypertensive heart disease with heart failure: Secondary | ICD-10-CM | POA: Diagnosis not present

## 2023-04-04 DIAGNOSIS — I10 Essential (primary) hypertension: Secondary | ICD-10-CM | POA: Diagnosis not present

## 2023-04-04 DIAGNOSIS — N23 Unspecified renal colic: Secondary | ICD-10-CM | POA: Diagnosis not present

## 2023-04-08 DIAGNOSIS — I482 Chronic atrial fibrillation, unspecified: Secondary | ICD-10-CM | POA: Diagnosis not present

## 2023-04-08 DIAGNOSIS — I5032 Chronic diastolic (congestive) heart failure: Secondary | ICD-10-CM | POA: Diagnosis not present

## 2023-04-08 DIAGNOSIS — N182 Chronic kidney disease, stage 2 (mild): Secondary | ICD-10-CM | POA: Diagnosis not present

## 2023-04-08 DIAGNOSIS — N23 Unspecified renal colic: Secondary | ICD-10-CM | POA: Diagnosis not present

## 2023-04-08 DIAGNOSIS — I10 Essential (primary) hypertension: Secondary | ICD-10-CM | POA: Diagnosis not present

## 2023-04-19 ENCOUNTER — Ambulatory Visit: Payer: Medicare Other | Attending: Cardiology | Admitting: *Deleted

## 2023-04-19 DIAGNOSIS — Z5181 Encounter for therapeutic drug level monitoring: Secondary | ICD-10-CM

## 2023-04-19 DIAGNOSIS — I4821 Permanent atrial fibrillation: Secondary | ICD-10-CM

## 2023-04-19 LAB — POCT INR: INR: 2.3 (ref 2.0–3.0)

## 2023-04-19 NOTE — Patient Instructions (Signed)
Continue warfarin 1 tablet daily except 1/2 tablet on Wednesdays.   Continue greens Recheck in 6 weeks  

## 2023-05-31 ENCOUNTER — Ambulatory Visit: Payer: Medicare Other | Attending: Cardiology

## 2023-05-31 DIAGNOSIS — Z5181 Encounter for therapeutic drug level monitoring: Secondary | ICD-10-CM

## 2023-05-31 DIAGNOSIS — I4821 Permanent atrial fibrillation: Secondary | ICD-10-CM

## 2023-05-31 LAB — POCT INR: INR: 2.2 (ref 2.0–3.0)

## 2023-05-31 NOTE — Patient Instructions (Signed)
Description   Continue warfarin 1 tablet daily except 1/2 tablet on Wednesdays.   Continue greens Recheck in 6 weeks

## 2023-06-01 ENCOUNTER — Other Ambulatory Visit: Payer: Self-pay | Admitting: Nurse Practitioner

## 2023-06-02 ENCOUNTER — Other Ambulatory Visit: Payer: Self-pay | Admitting: "Endocrinology

## 2023-06-02 DIAGNOSIS — E1159 Type 2 diabetes mellitus with other circulatory complications: Secondary | ICD-10-CM

## 2023-06-15 ENCOUNTER — Other Ambulatory Visit: Payer: Self-pay | Admitting: "Endocrinology

## 2023-06-30 ENCOUNTER — Other Ambulatory Visit: Payer: Self-pay | Admitting: Cardiology

## 2023-07-04 DIAGNOSIS — K219 Gastro-esophageal reflux disease without esophagitis: Secondary | ICD-10-CM | POA: Diagnosis not present

## 2023-07-04 DIAGNOSIS — J309 Allergic rhinitis, unspecified: Secondary | ICD-10-CM | POA: Diagnosis not present

## 2023-07-04 DIAGNOSIS — E1121 Type 2 diabetes mellitus with diabetic nephropathy: Secondary | ICD-10-CM | POA: Diagnosis not present

## 2023-07-04 DIAGNOSIS — Z0000 Encounter for general adult medical examination without abnormal findings: Secondary | ICD-10-CM | POA: Diagnosis not present

## 2023-07-04 DIAGNOSIS — I5032 Chronic diastolic (congestive) heart failure: Secondary | ICD-10-CM | POA: Diagnosis not present

## 2023-07-04 DIAGNOSIS — I10 Essential (primary) hypertension: Secondary | ICD-10-CM | POA: Diagnosis not present

## 2023-07-04 DIAGNOSIS — N1831 Chronic kidney disease, stage 3a: Secondary | ICD-10-CM | POA: Diagnosis not present

## 2023-07-04 DIAGNOSIS — I482 Chronic atrial fibrillation, unspecified: Secondary | ICD-10-CM | POA: Diagnosis not present

## 2023-07-12 ENCOUNTER — Ambulatory Visit: Payer: Medicare Other | Attending: Cardiology | Admitting: *Deleted

## 2023-07-12 DIAGNOSIS — I4821 Permanent atrial fibrillation: Secondary | ICD-10-CM | POA: Diagnosis not present

## 2023-07-12 DIAGNOSIS — Z5181 Encounter for therapeutic drug level monitoring: Secondary | ICD-10-CM | POA: Diagnosis not present

## 2023-07-12 LAB — POCT INR: INR: 2.1 (ref 2.0–3.0)

## 2023-07-12 NOTE — Patient Instructions (Signed)
Continue warfarin 1 tablet daily except 1/2 tablet on Wednesdays.   Continue greens Recheck in 6 weeks  

## 2023-07-19 ENCOUNTER — Ambulatory Visit: Payer: Medicare Other | Admitting: "Endocrinology

## 2023-07-19 ENCOUNTER — Encounter: Payer: Self-pay | Admitting: "Endocrinology

## 2023-07-19 VITALS — BP 132/78 | HR 60 | Ht 70.0 in | Wt 213.0 lb

## 2023-07-19 DIAGNOSIS — E1159 Type 2 diabetes mellitus with other circulatory complications: Secondary | ICD-10-CM | POA: Diagnosis not present

## 2023-07-19 DIAGNOSIS — Z794 Long term (current) use of insulin: Secondary | ICD-10-CM | POA: Diagnosis not present

## 2023-07-19 DIAGNOSIS — I10 Essential (primary) hypertension: Secondary | ICD-10-CM

## 2023-07-19 DIAGNOSIS — E782 Mixed hyperlipidemia: Secondary | ICD-10-CM

## 2023-07-19 LAB — POCT GLYCOSYLATED HEMOGLOBIN (HGB A1C): HbA1c, POC (controlled diabetic range): 6.6 % (ref 0.0–7.0)

## 2023-07-19 MED ORDER — TOUJEO MAX SOLOSTAR 300 UNIT/ML ~~LOC~~ SOPN: PEN_INJECTOR | SUBCUTANEOUS | 1 refills | Status: DC

## 2023-07-19 MED ORDER — METFORMIN HCL 500 MG PO TABS: 500.0000 mg | ORAL_TABLET | Freq: Every day | ORAL | 1 refills | Status: DC

## 2023-07-19 NOTE — Patient Instructions (Signed)

## 2023-07-19 NOTE — Progress Notes (Signed)
07/19/2023  Endocrinology follow-up note   Subjective:    Patient ID: Daniel Schaefer, male    DOB: 1951-07-31,    Past Medical History:  Diagnosis Date   Atrial fibrillation [facility])    Reportedly diagnosed November 2011   Essential hypertension    History of kidney stones    Prostate cancer [facility])    Radiation implants   Type 2 diabetes mellitus (HCC)    Past Surgical History:  Procedure Laterality Date   Gold seed placement     INGUINAL HERNIA REPAIR Left 07/02/2020   Procedure: LEFT INGUINAL HERNIORRHAPY WITH MESH;  Surgeon: Franky Macho, MD;  Location: [facility];  Service: General;  Laterality: Left;   LITHOTRIPSY     PROSTATE BIOPSY     TRIGGER FINGER RELEASE Left    ring finger   URETEROSCOPY WITH HOLMIUM LASER LITHOTRIPSY     Social History   Socioeconomic History   Marital status: Divorced    Spouse name: Not on file   Number of children: Not on file   Years of education: Not on file   Highest education level: Not on file  Occupational History   Not on file  Tobacco Use   Smoking status: Never   Smokeless tobacco: Never  Vaping Use   Vaping status: Never Used  Substance and Sexual Activity   Alcohol use: No    Alcohol/week: 0.0 standard drinks of alcohol   Drug use: No   Sexual activity: Yes  Other Topics Concern   Not on file  Social History Narrative   Not on file   Social Determinants of Health   Financial Resource Strain: Not on file  Food Insecurity: Not on file  Transportation Needs: Not on file  Physical Activity: Not on file  Stress: Not on file  Social Connections: Not on file   Outpatient Encounter Medications as of 07/19/2023  Medication Sig   amLODipine (NORVASC) 5 MG tablet Take 1 tablet by mouth once daily   Azelastine HCl 137 MCG/SPRAY SOLN Place 2 sprays into both nostrils daily.   Blood Glucose Monitoring Suppl (ACCU-CHEK GUIDE ME) w/Device KIT 1 Piece by Does not apply route as directed.   Cholecalciferol (VITAMIN D3) 125 MCG  (5000 UT) TABS Take 5,000 Units by mouth daily.    Flaxseed, Linseed, (FLAX SEEDS PO) Take by mouth.   furosemide (LASIX) 20 MG tablet Take 1 tablet by mouth once daily   GLOBAL EASE INJECT PEN NEEDLES 31G X 8 MM MISC USE 1 DAILY AS DIRECTED - RUN ON CASH $15 - PER PC.   glucose blood (ACCU-CHEK GUIDE) test strip USE 1 STRIP TO CHECK GLUCOSE THREE TIMES DAILY   insulin glargine, 2 Unit Dial, (TOUJEO MAX SOLOSTAR) 300 UNIT/ML Solostar Pen INJECT 56 UNITS SUBCUTANEOUSLY AT BEDTIME   Insulin Pen Needle (B-D ULTRAFINE III SHORT PEN) 31G X 8 MM MISC 1 each by Does not apply route as directed.   Lancets MISC 1 each by Does not apply route as directed.   levocetirizine (XYZAL) 5 MG tablet SMARTSIG:1 Tablet(s) By Mouth Every Evening   melatonin 5 MG TABS Take 5 mg by mouth.   metFORMIN (GLUCOPHAGE) 500 MG tablet Take 1 tablet (500 mg total) by mouth daily with breakfast.   olmesartan (BENICAR) 40 MG tablet Take 1 tablet (40 mg total) by mouth daily.   Omega-3 Fat Ac-Cholecalciferol (SUPERIOR OMEGA3 W/ VITAMIN D PO) Take by mouth daily.   omeprazole (PRILOSEC) 20 MG capsule Take 20 mg by mouth  daily.    oxymetazoline (AFRIN) 0.05 % nasal spray Place 1 spray into both nostrils 2 (two) times daily as needed for congestion.   potassium chloride (KLOR-CON) 10 MEQ tablet TAKE 1 TABLET BY MOUTH DAILY   pravastatin (PRAVACHOL) 40 MG tablet TAKE 1 TABLET BY MOUTH AT BEDTIME   tadalafil (CIALIS) 20 MG tablet Take 20 mg by mouth daily as needed for erectile dysfunction.   tamsulosin (FLOMAX) 0.4 MG CAPS capsule Take 1 capsule (0.4 mg total) by mouth daily.   warfarin (COUMADIN) 5 MG tablet TAKE 1 TABLET BY MOUTH ONCE DAILY EXCEPT  1/2  TABLET  ON  WEDNESDAYS  OR  AS  DIRECTED   [DISCONTINUED] metFORMIN (GLUCOPHAGE) 500 MG tablet TAKE 1 TABLET BY MOUTH TWICE DAILY WITH A MEAL   [DISCONTINUED] TOUJEO MAX SOLOSTAR 300 UNIT/ML Solostar Pen INJECT 64 UNITS SUBCUTANEOUSLY AT BEDTIME   No facility-administered  encounter medications on file as of 07/19/2023.   ALLERGIES: Allergies  Allergen Reactions   Bactrim [Sulfamethoxazole-Trimethoprim] Other (See Comments)    Thin his blood really bad   Other     Cannot mix warfarin and bactrim    [name] Flextouch [Insulin Degludec] Rash   VACCINATION STATUS: Immunization History  Administered Date(s) Administered   [employer] Vaccination 10/04/2019, 11/05/2019    Diabetes He presents for his follow-up diabetic visit. He has type 2 diabetes mellitus. Onset time: He was diagnosed at approximate age of 13 years. His disease course has been improving. There are no hypoglycemic associated symptoms. There are no diabetic associated symptoms. There are no hypoglycemic complications. Symptoms are improving. Diabetic complications include nephropathy. Risk factors for coronary artery disease include hypertension, dyslipidemia, sedentary lifestyle, male sex and diabetes mellitus. Current diabetic treatment includes oral agent (monotherapy) and insulin injections. He is compliant with treatment most of the time. His weight is fluctuating minimally. He is following a generally unhealthy diet. When asked about meal planning, he reported none. He has not had a previous visit with a dietitian. He rarely participates in exercise. His home blood glucose trend is decreasing steadily. His breakfast blood glucose range is generally 110-130 mg/dl. His bedtime blood glucose range is generally 130-140 mg/dl. His overall blood glucose range is 130-140 mg/dl. (He comes with his logs and meter showing average fasting blood glucose between 95 to 105 mg/day.  His point-of-care A1c is 6.2%, progressively improving from 7.8%.  He did have some mild, rare fasting hypoglycemia. ) An ACE inhibitor/angiotensin II receptor blocker is being taken. He does not see a podiatrist.Eye exam is current.  Hypertension This is a chronic problem. The current episode started more than 1 year ago.  The problem is unchanged. The problem is controlled. There are no associated agents to hypertension. Risk factors for coronary artery disease include diabetes mellitus, dyslipidemia, male gender and sedentary lifestyle. Past treatments include ACE inhibitors, calcium channel blockers, diuretics and beta blockers. The current treatment provides moderate improvement. There are no compliance problems.  Hypertensive end-organ damage includes kidney disease. Identifiable causes of hypertension include chronic renal disease.  Hyperlipidemia This is a chronic problem. The current episode started more than 1 year ago. The problem is controlled. Recent lipid tests were reviewed and are normal. Exacerbating diseases include chronic renal disease and diabetes. Factors aggravating his hyperlipidemia include beta blockers. Current antihyperlipidemic treatment includes statins. The current treatment provides moderate improvement of lipids. There are no compliance problems.  Risk factors for coronary artery disease include dyslipidemia, diabetes mellitus, hypertension, male sex and a  sedentary lifestyle.    Review of systems  Constitutional: + Minimally fluctuating body weight,  current Body mass index is 30.56 kg/m. , no fatigue, no subjective hyperthermia, no subjective hypothermia    Objective:    BP 132/78   Pulse 60   Ht 5\' 10"  (1.778 m)   Wt 213 lb (96.6 kg)   BMI 30.56 kg/m   Wt Readings from Last 3 Encounters:  07/19/23 213 lb (96.6 kg)  03/24/23 222 lb (100.7 kg)  03/10/23 222 lb (100.7 kg)    BP Readings from Last 3 Encounters:  07/19/23 132/78  03/24/23 (!) 153/72  03/10/23 132/78     Physical Exam- Limited  Constitutional:  Body mass index is 30.56 kg/m. , not in acute distress, normal state of mind Eyes:  EOMI, no exophthalmos     Results for orders placed or performed in visit on 07/19/23  HgB A1c  Result Value Ref Range   Hemoglobin A1C     HbA1c POC (<> result, manual  entry)     HbA1c, POC (prediabetic range)     HbA1c, POC (controlled diabetic range) 6.6 0.0 - 7.0 %   Complete Blood Count (Most recent): Lab Results  Component Value Date   WBC 10.2 09/27/2022   HGB 11.9 (L) 09/27/2022   HCT 38.5 (L) 09/27/2022   MCV 76.4 (L) 09/27/2022   PLT 269 09/27/2022   Chemistry (most recent): Lab Results  Component Value Date   NA 142 03/03/2023   K 4.5 03/03/2023   CL 104 03/03/2023   CO2 23 03/03/2023   BUN 24 03/03/2023   CREATININE 1.23 03/03/2023   Lipid Panel     Component Value Date/Time   CHOL 123 03/03/2023 1149   TRIG 161 (H) 03/03/2023 1149   HDL 41 03/03/2023 1149   CHOLHDL 3.0 03/03/2023 1149   LDLCALC 55 03/03/2023 1149    Assessment & Plan:   1) Type 2 diabetes mellitus with stage 3 renal insufficiency, with long-term current use of insulin (HCC)  He comes with his logs and meter showing average fasting blood glucose between 95 to 105 mg/day.  His point-of-care A1c is 6.2%, progressively improving from 7.8%.  He did have some mild, rare fasting hypoglycemia.  -Patient remains at a high risk for more acute and chronic complications of diabetes which include CAD, CVA, CKD, retinopathy, and neuropathy. These are all discussed in detail with the patient.  -Recent labs reviewed showing improved renal function.  - Nutritional counseling repeated at each appointment due to patients tendency to fall back in to old habits.  - he acknowledges that there is a room for improvement in his food and drink choices. - Suggestion is made for him to avoid simple carbohydrates  from his diet including Cakes, Sweet Desserts, Ice Cream, Soda (diet and regular), Sweet Tea, Candies, Chips, Cookies, Store Bought Juices, Alcohol , Artificial Sweeteners,  Coffee Creamer, and "Sugar-free" Products, Lemonade. This will help patient to have more stable blood glucose profile and potentially avoid unintended weight gain.   The following Lifestyle Medicine  recommendations according to American College of Lifestyle Medicine  [facility]) were discussed and and offered to patient and he  agrees to start the journey:  A. Whole Foods, Plant-Based Nutrition comprising of fruits and vegetables, plant-based proteins, whole-grain carbohydrates was discussed in detail with the patient.   A list for source of those nutrients were also provided to the patient.  Patient will use only water or unsweetened tea for  hydration. B.  The need to stay away from risky substances including alcohol, smoking; obtaining 7 to 9 hours of restorative sleep, at least 150 minutes of moderate intensity exercise weekly, the importance of healthy social connections.   -He is following with Norm Salt, CDE for diabetes education.  - He presents with controlled glycemic profile and improving A1c of 6.2%.  In light of his mild, rare hypoglycemia, he is advised to lower Toujeo to 56 units nightly,  associated with monitoring of blood glucose twice a day-daily before breakfast and at bedtime.       He worries about cost of medications, previously declined offer for Trulicity/Ozempic weekly injections.   He has sulfa allergy, not a candidate for glipizide.  His recent CMP showed CKD, and new development for him.  I advised him to lower metformin to 500 mg p.o. daily at breakfast.     This patient would have benefited from a CGM.  He is still researching and will let me know if you would like to go on a CGM.   -In the meantime, he is encouraged to continue monitoring blood glucose twice daily, before breakfast and before bed, and to call the clinic if he has readings less than 70 or greater than 150 mg per DL 3 days in a week at fasting.    - He is generally hesitant to add medications.  - Patient specific target  for A1c; LDL, HDL, Triglycerides, and  Waist Circumference were discussed in detail.  2) BP/HTN: -His blood pressure is controlled to target.  He is advised to continue  Norvasc 5 mg po daily, Lasix 20 mg po daily, Lisinopril 20 mg po daily, and Metoprolol 25 mg po twice daily.  3) Lipids/HPL:  His most recent lipid panel showed significant improvement in his LDL at 55.  He is advised to continue pravastatin 40 mg p.o. daily at bedtime.    Side effects and precautions discussed with him.  He recently had his labs drawn at his PCP, will request copy.  4)  Weight/Diet:  His Body mass index is 30.56 kg/m.-candidate for some weight loss.  He is following with Norm Salt, CD for diabetes education.  No success in weight loss,  exercise, and carbohydrates information provided.  5) Chronic Care/Health Maintenance: -Patient on ACEI and Statin medications and encouraged to continue to follow up with Ophthalmology, Podiatrist at least yearly or according to recommendations, and advised to stay away from smoking. I have recommended yearly flu vaccine and pneumonia vaccination at least every 5 years; moderate intensity exercise for up to 150 minutes weekly; and  sleep for at least 7 hours a day.   He is advised to maintain close follow-up with his PMD  for primary care needs.   I spent  26  minutes in the care of the patient today including review of labs from CMP, Lipids, Thyroid Function, Hematology (current and previous including abstractions from other facilities); face-to-face time discussing  his blood glucose readings/logs, discussing hypoglycemia and hyperglycemia episodes and symptoms, medications doses, his options of short and long term treatment based on the latest standards of care / guidelines;  discussion about incorporating lifestyle medicine;  and documenting the encounter. Risk reduction counseling performed per USPSTF guidelines to reduce  obesity and cardiovascular risk factors.     Please refer to Patient Instructions for Blood Glucose Monitoring and Insulin/Medications Dosing Guide"  in media tab for additional information. Please  also refer to "  Patient Self Inventory"  in the Media  tab for reviewed elements of pertinent patient history.  Daniel Schaefer participated in the discussions, expressed understanding, and voiced agreement with the above plans.  All questions were answered to his satisfaction. he is encouraged to contact clinic should he have any questions or concerns prior to his return visit.     Follow up plan: Return in about 4 months (around 11/16/2023) for F/U with Pre-visit Labs, Meter/CGM/Logs, A1c here.  Ronny Bacon, [facility], [facility] [address] [place], [place] [address] Phone: [phone] Fax: [phone]  07/19/2023, 5:36 PM

## 2023-08-07 ENCOUNTER — Other Ambulatory Visit: Payer: Self-pay | Admitting: Cardiology

## 2023-08-22 DIAGNOSIS — I10 Essential (primary) hypertension: Secondary | ICD-10-CM | POA: Diagnosis not present

## 2023-08-22 DIAGNOSIS — E1121 Type 2 diabetes mellitus with diabetic nephropathy: Secondary | ICD-10-CM | POA: Diagnosis not present

## 2023-08-22 DIAGNOSIS — N1831 Chronic kidney disease, stage 3a: Secondary | ICD-10-CM | POA: Diagnosis not present

## 2023-08-23 ENCOUNTER — Ambulatory Visit: Payer: Medicare Other | Attending: Cardiology | Admitting: *Deleted

## 2023-08-23 DIAGNOSIS — I4821 Permanent atrial fibrillation: Secondary | ICD-10-CM

## 2023-08-23 DIAGNOSIS — Z5181 Encounter for therapeutic drug level monitoring: Secondary | ICD-10-CM | POA: Diagnosis not present

## 2023-08-23 LAB — POCT INR: INR: 2.1 (ref 2.0–3.0)

## 2023-08-23 NOTE — Patient Instructions (Signed)
Continue warfarin 1 tablet daily except 1/2 tablet on Wednesdays.   Continue greens Recheck in 6 weeks  

## 2023-09-01 ENCOUNTER — Encounter: Payer: Self-pay | Admitting: Cardiology

## 2023-09-01 ENCOUNTER — Ambulatory Visit: Payer: Medicare Other | Attending: Cardiology | Admitting: Cardiology

## 2023-09-01 VITALS — BP 124/80 | HR 71 | Ht 70.0 in | Wt 213.6 lb

## 2023-09-01 DIAGNOSIS — I10 Essential (primary) hypertension: Secondary | ICD-10-CM

## 2023-09-01 DIAGNOSIS — I4821 Permanent atrial fibrillation: Secondary | ICD-10-CM

## 2023-09-01 DIAGNOSIS — E782 Mixed hyperlipidemia: Secondary | ICD-10-CM | POA: Diagnosis not present

## 2023-09-01 NOTE — Progress Notes (Signed)
    Cardiology Office Note  Date: 09/01/2023   ID: Quido, Bruington 1951-08-23, MRN [id number]  History of Present Illness: Daniel Schaefer is a 72 y.o. male last seen in June.  He is here for a routine visit.  Reports no exertional chest pain or palpitations.  Still exercising 6 days a week (3 days at [employer] and 3 days at [employer]).  I reviewed his medications.  Current cardiac regimen includes Norvasc, Benicar, Lasix with potassium supplement, and Pravachol. He remains on Coumadin with follow-up in the anticoagulation clinic. Recent INR was 2.1.  He does not report any spontaneous bleeding problems.  Physical Exam: VS:  BP 124/80   Pulse 71   Ht 5\' 10"  (1.778 m)   Wt 213 lb 9.6 oz (96.9 kg)   SpO2 98%   BMI 30.65 kg/m , BMI Body mass index is 30.65 kg/m.  Wt Readings from Last 3 Encounters:  09/01/23 213 lb 9.6 oz (96.9 kg)  07/19/23 213 lb (96.6 kg)  03/24/23 222 lb (100.7 kg)    General: Patient appears comfortable at rest. HEENT: Conjunctiva and lids normal. Neck: Supple, no elevated JVP or carotid bruits. Lungs: Clear to auscultation, nonlabored breathing at rest. Cardiac: Irregularly irregular, no significant murmur or gallop.  ECG:  An ECG dated 07/01/2022 was personally reviewed today and demonstrated:  Atrial fibrillation with PVC and nonspecific T wave changes.  Labwork: 09/27/2022: Hemoglobin 11.9; Platelets 269 03/03/2023: ALT 17; AST 22; BUN 24; Creatinine, Ser 1.23; Potassium 4.5; Sodium 142     Component Value Date/Time   CHOL 123 03/03/2023 1149   TRIG 161 (H) 03/03/2023 1149   HDL 41 03/03/2023 1149   CHOLHDL 3.0 03/03/2023 1149   LDLCALC 55 03/03/2023 1149  November 2024: Hemoglobin A1c 6.6%  Other Studies Reviewed Today:  No interval cardiac testing for review today.  Assessment and Plan:  1.  Permanent atrial fibrillation with CHA2DS2-VASc score of 3.  He is doing well without active palpitations and has adequate heart rate  control at baseline in the absence of AV nodal blocker at this point.  Continue Coumadin for stroke prophylaxis.   2.  Primary hypertension.  Blood pressure is well-controlled today.  Continue Benicar and Norvasc.  3.  Mixed hyperlipidemia.  He is on Pravachol.  LDL 55 in June.  Disposition:  Follow up  6 months.  Signed, Jonelle Sidle, M.D., F.A.C.C. [facility]

## 2023-09-01 NOTE — Patient Instructions (Addendum)

## 2023-09-13 DIAGNOSIS — M85852 Other specified disorders of bone density and structure, left thigh: Secondary | ICD-10-CM | POA: Diagnosis not present

## 2023-09-13 DIAGNOSIS — M810 Age-related osteoporosis without current pathological fracture: Secondary | ICD-10-CM | POA: Diagnosis not present

## 2023-09-25 ENCOUNTER — Other Ambulatory Visit: Payer: Self-pay | Admitting: Internal Medicine

## 2023-09-25 ENCOUNTER — Other Ambulatory Visit: Payer: Self-pay | Admitting: Cardiology

## 2023-09-26 NOTE — Telephone Encounter (Signed)
 Pt was last seen 12-07-22. Pt is requesting a refill, but the lov does not state anything about this medication. Please advise.

## 2023-09-26 NOTE — Telephone Encounter (Signed)
 Ok x one month but needs recheck bmet for more, either here or preferably by PCP

## 2023-10-04 ENCOUNTER — Ambulatory Visit: Payer: Medicare Other | Attending: Cardiology | Admitting: *Deleted

## 2023-10-04 DIAGNOSIS — I4821 Permanent atrial fibrillation: Secondary | ICD-10-CM

## 2023-10-04 DIAGNOSIS — Z5181 Encounter for therapeutic drug level monitoring: Secondary | ICD-10-CM | POA: Diagnosis not present

## 2023-10-04 LAB — POCT INR: INR: 1.8 — AB (ref 2.0–3.0)

## 2023-10-04 NOTE — Patient Instructions (Signed)
Take warfarin 1 1/2 tablets tonight then resume 1 tablet daily except 1/2 tablet on Wednesdays.   Continue greens Recheck in 3 weeks

## 2023-10-05 DIAGNOSIS — N1831 Chronic kidney disease, stage 3a: Secondary | ICD-10-CM | POA: Diagnosis not present

## 2023-10-05 DIAGNOSIS — I10 Essential (primary) hypertension: Secondary | ICD-10-CM | POA: Diagnosis not present

## 2023-10-05 DIAGNOSIS — E1121 Type 2 diabetes mellitus with diabetic nephropathy: Secondary | ICD-10-CM | POA: Diagnosis not present

## 2023-10-10 DIAGNOSIS — E1121 Type 2 diabetes mellitus with diabetic nephropathy: Secondary | ICD-10-CM | POA: Diagnosis not present

## 2023-10-10 DIAGNOSIS — I10 Essential (primary) hypertension: Secondary | ICD-10-CM | POA: Diagnosis not present

## 2023-10-10 DIAGNOSIS — E1122 Type 2 diabetes mellitus with diabetic chronic kidney disease: Secondary | ICD-10-CM | POA: Diagnosis not present

## 2023-10-10 DIAGNOSIS — I482 Chronic atrial fibrillation, unspecified: Secondary | ICD-10-CM | POA: Diagnosis not present

## 2023-10-10 DIAGNOSIS — I5032 Chronic diastolic (congestive) heart failure: Secondary | ICD-10-CM | POA: Diagnosis not present

## 2023-10-10 DIAGNOSIS — N1831 Chronic kidney disease, stage 3a: Secondary | ICD-10-CM | POA: Diagnosis not present

## 2023-10-10 DIAGNOSIS — Z0000 Encounter for general adult medical examination without abnormal findings: Secondary | ICD-10-CM | POA: Diagnosis not present

## 2023-10-10 DIAGNOSIS — J309 Allergic rhinitis, unspecified: Secondary | ICD-10-CM | POA: Diagnosis not present

## 2023-10-10 DIAGNOSIS — K219 Gastro-esophageal reflux disease without esophagitis: Secondary | ICD-10-CM | POA: Diagnosis not present

## 2023-10-11 ENCOUNTER — Other Ambulatory Visit: Payer: Self-pay | Admitting: Cardiology

## 2023-10-11 NOTE — Telephone Encounter (Signed)
Refill request for warfarin:  Last INR was 1.8 on 10/04/23 Next INR due 10/25/23 LOV was 09/01/23  Refill approved.

## 2023-10-13 ENCOUNTER — Other Ambulatory Visit: Payer: Self-pay | Admitting: Cardiology

## 2023-10-13 ENCOUNTER — Encounter (INDEPENDENT_AMBULATORY_CARE_PROVIDER_SITE_OTHER): Payer: Self-pay | Admitting: *Deleted

## 2023-10-18 ENCOUNTER — Other Ambulatory Visit: Payer: Self-pay | Admitting: "Endocrinology

## 2023-10-18 DIAGNOSIS — Z794 Long term (current) use of insulin: Secondary | ICD-10-CM

## 2023-10-25 ENCOUNTER — Ambulatory Visit: Payer: Medicare Other | Attending: Cardiology | Admitting: *Deleted

## 2023-10-25 DIAGNOSIS — I4821 Permanent atrial fibrillation: Secondary | ICD-10-CM

## 2023-10-25 DIAGNOSIS — Z5181 Encounter for therapeutic drug level monitoring: Secondary | ICD-10-CM

## 2023-10-25 LAB — POCT INR: INR: 2.1 (ref 2.0–3.0)

## 2023-10-25 NOTE — Patient Instructions (Signed)
Continue warfarin 1 tablet daily except 1/2 tablet on Wednesdays.   Continue greens Recheck in 4 weeks

## 2023-10-26 ENCOUNTER — Telehealth (INDEPENDENT_AMBULATORY_CARE_PROVIDER_SITE_OTHER): Payer: Self-pay | Admitting: Gastroenterology

## 2023-10-26 NOTE — Telephone Encounter (Signed)
Who is your primary care physician: Daniel Schaefer  Reasons for the colonoscopy:   Have you had a colonoscopy before?  Yes 12 years ago  Do you have family history of colon cancer? no  Previous colonoscopy with polyps removed? no  Do you have a history colorectal cancer?   no  Are you diabetic? If yes, Type 1 or Type 2?    Yes type 2  Do you have a prosthetic or mechanical heart valve? no  Do you have a pacemaker/defibrillator?   no  Have you had endocarditis/atrial fibrillation? yes  Have you had joint replacement within the last 12 months?  no  Do you tend to be constipated or have to use laxatives? no  Do you have any history of drugs or alchohol?  no  Do you use supplemental oxygen?  no  Have you had a stroke or heart attack within the last 6 months? no  Do you take weight loss medication?  no  Do you take any blood-thinning medications such as: (aspirin, warfarin, Plavix, Aggrenox)  yes  If yes we need the name, milligram, dosage and who is prescribing doctor Warfarin 5 mg tab 1 daily in the evening Current Outpatient Medications on File Prior to Visit  Medication Sig Dispense Refill   albuterol (VENTOLIN HFA) 108 (90 Base) MCG/ACT inhaler Inhale into the lungs.     amLODipine (NORVASC) 5 MG tablet Take 1 tablet by mouth once daily 90 tablet 0   Azelastine HCl 137 MCG/SPRAY SOLN Place 2 sprays into both nostrils daily.     Cholecalciferol (VITAMIN D3) 125 MCG (5000 UT) TABS Take 5,000 Units by mouth daily.      Flaxseed, Linseed, (FLAX SEEDS PO) Take by mouth.     furosemide (LASIX) 20 MG tablet Take 1 tablet by mouth once daily 90 tablet 1   GLOBAL EASE INJECT PEN NEEDLES 31G X 8 MM MISC USE 1 DAILY AS DIRECTED - RUN ON CASH $15 - PER PC. 100 each 5   glucose blood (ACCU-CHEK GUIDE TEST) test strip USE 1 STRIP TO CHECK GLUCOSE THREE TIMES DAILY 100 each 2   insulin glargine, 2 Unit Dial, (TOUJEO MAX SOLOSTAR) 300 UNIT/ML Solostar Pen INJECT 56 UNITS SUBCUTANEOUSLY AT  BEDTIME 30 mL 1   Lancets MISC 1 each by Does not apply route as directed. 100 each 3   levocetirizine (XYZAL) 5 MG tablet SMARTSIG:1 Tablet(s) By Mouth Every Evening     melatonin 5 MG TABS Take 5 mg by mouth.     metFORMIN (GLUCOPHAGE) 500 MG tablet Take 1 tablet (500 mg total) by mouth daily with breakfast. 90 tablet 1   olmesartan (BENICAR) 40 MG tablet Take 1 tablet (40 mg total) by mouth daily. REFILLS TO PCP 30 tablet 0   Omega-3 Fat Ac-Cholecalciferol (SUPERIOR OMEGA3 W/ VITAMIN D PO) Take by mouth daily.     omeprazole (PRILOSEC) 20 MG capsule Take 20 mg by mouth daily.      oxymetazoline (AFRIN) 0.05 % nasal spray Place 1 spray into both nostrils 2 (two) times daily as needed for congestion.     potassium chloride (KLOR-CON) 10 MEQ tablet TAKE 1 TABLET BY MOUTH DAILY 100 tablet 2   pravastatin (PRAVACHOL) 40 MG tablet TAKE 1 TABLET BY MOUTH AT BEDTIME 90 tablet 3   tamsulosin (FLOMAX) 0.4 MG CAPS capsule Take 1 capsule (0.4 mg total) by mouth daily. 90 capsule 3   warfarin (COUMADIN) 5 MG tablet TAKE 1 TABLET BY MOUTH ONCE DAILY  EXCEPT TAKE 1/2 TABLET ON WEDNESDAYS OR AS DIRECTED 35 tablet 5   Blood Glucose Monitoring Suppl (ACCU-CHEK GUIDE ME) w/Device KIT 1 Piece by Does not apply route as directed. 1 kit 0   Insulin Pen Needle (B-D ULTRAFINE III SHORT PEN) 31G X 8 MM MISC 1 each by Does not apply route as directed. (Patient not taking: Reported on 10/26/2023) 50 each 2   tadalafil (CIALIS) 20 MG tablet Take 20 mg by mouth daily as needed for erectile dysfunction. (Patient not taking: Reported on 10/26/2023)     No current facility-administered medications on file prior to visit.    Allergies  Allergen Reactions   Bactrim [Sulfamethoxazole-Trimethoprim] Other (See Comments)    Thin his blood really bad   Other     Cannot mix warfarin and bactrim    [name] Flextouch [Insulin Degludec] Rash     Pharmacy: [facility]  Primary Insurance Name: [facility]  Best number  where you can be reached: [phone]

## 2023-10-26 NOTE — Telephone Encounter (Signed)
Ok to schedule.  Room 3. Cardiology clearance for Coumadin   Thanks,  Vista Lawman, MD Gastroenterology and Hepatology [facility]

## 2023-10-31 ENCOUNTER — Telehealth (INDEPENDENT_AMBULATORY_CARE_PROVIDER_SITE_OTHER): Payer: Self-pay | Admitting: Gastroenterology

## 2023-10-31 MED ORDER — PEG 3350-KCL-NA BICARB-NACL 420 G PO SOLR: 4000.0000 mL | Freq: Once | ORAL | 0 refills | Status: AC

## 2023-10-31 NOTE — Addendum Note (Signed)
Addended by: Marlowe Shores on: 10/31/2023 12:04 PM   Modules accepted: Orders

## 2023-10-31 NOTE — Telephone Encounter (Signed)
Left message to return call. Held appt on 3/21 for pt since he is requesting Friday morning. Clearance sent to pre op cardiology pool

## 2023-10-31 NOTE — Telephone Encounter (Signed)
Pt returned call. Pt scheduled for 12/02/23 at 7:30. Instructions will be sent once pre op has been received. Pt can not do pre op on Wednedsay-will send message to Endo. Prep sent to pharmacy. No pa needed per insurance.

## 2023-10-31 NOTE — Telephone Encounter (Signed)
Please advise holding coumadin prior to colonoscopy.   Thank you!  DW

## 2023-10-31 NOTE — Telephone Encounter (Signed)
    10/31/23  Daniel Schaefer Mar 22, 1951  What type of surgery is being performed? Colonoscopy  When is surgery scheduled? TBD  What type of clearance is required (medical or pharmacy to hold medication or both? Pharmacy to hold med  Are there any medications that need to be held prior to surgery and how long? Clearance to hold Coumadin for 5 days prior  Name of physician performing surgery?  Dr. Starleen Arms Gastroenterology at [facility] Phone: [phone] Fax: [phone]  Anethesia type (none, local, MAC, general)? MAC

## 2023-11-01 ENCOUNTER — Encounter (INDEPENDENT_AMBULATORY_CARE_PROVIDER_SITE_OTHER): Payer: Self-pay | Admitting: *Deleted

## 2023-11-01 NOTE — Telephone Encounter (Signed)
 Referral completed, TCS apt letter sent to PCP

## 2023-11-02 NOTE — Telephone Encounter (Signed)
   Name: Daniel Schaefer  DOB: [birth year]  MRN: [id number]   Primary Cardiologist: Nona Dell, MD  Chart reviewed as part of pre-operative protocol coverage.   Per Pharm D, patient may hold warfarin for 5 days prior to procedure.  Patient will not need bridging with Lovenox (enoxaparin) around procedure.   I will route this recommendation to the requesting party via Epic fax function and remove from pre-op pool. Please call with questions.  Carlos Levering, NP 11/02/2023, 2:54 PM

## 2023-11-02 NOTE — Telephone Encounter (Signed)
Patient with diagnosis of afib on warfarin for anticoagulation.    Procedure: colonoscopy Date of procedure: TBD   CHA2DS2-VASc Score = 4   This indicates a 4.8% annual risk of stroke. The patient's score is based upon: CHF History: 1 HTN History: 1 Diabetes History: 1 Stroke History: 0 Vascular Disease History: 0 Age Score: 1 Gender Score: 0       Per office protocol, patient can hold warfarin for 5 days prior to procedure.    Patient will NOT need bridging with Lovenox (enoxaparin) around procedure.  **This guidance is not considered finalized until pre-operative APP has relayed final recommendations.**

## 2023-11-05 ENCOUNTER — Other Ambulatory Visit: Payer: Self-pay | Admitting: Nurse Practitioner

## 2023-11-05 ENCOUNTER — Other Ambulatory Visit: Payer: Self-pay | Admitting: Cardiology

## 2023-11-11 ENCOUNTER — Other Ambulatory Visit: Payer: Self-pay | Admitting: "Endocrinology

## 2023-11-11 ENCOUNTER — Other Ambulatory Visit: Payer: Self-pay

## 2023-11-12 ENCOUNTER — Other Ambulatory Visit: Payer: Self-pay | Admitting: "Endocrinology

## 2023-11-17 DIAGNOSIS — E1159 Type 2 diabetes mellitus with other circulatory complications: Secondary | ICD-10-CM | POA: Diagnosis not present

## 2023-11-17 DIAGNOSIS — Z794 Long term (current) use of insulin: Secondary | ICD-10-CM | POA: Diagnosis not present

## 2023-11-18 LAB — COMPREHENSIVE METABOLIC PANEL
ALT: 12 IU/L (ref 0–44)
AST: 16 IU/L (ref 0–40)
Albumin: 4 g/dL (ref 3.8–4.8)
Alkaline Phosphatase: 70 IU/L (ref 44–121)
BUN/Creatinine Ratio: 13 (ref 10–24)
BUN: 18 mg/dL (ref 8–27)
Bilirubin Total: 0.4 mg/dL (ref 0.0–1.2)
CO2: 24 mmol/L (ref 20–29)
Calcium: 9.1 mg/dL (ref 8.6–10.2)
Chloride: 104 mmol/L (ref 96–106)
Creatinine, Ser: 1.34 mg/dL — ABNORMAL HIGH (ref 0.76–1.27)
Globulin, Total: 2.5 g/dL (ref 1.5–4.5)
Glucose: 76 mg/dL (ref 70–99)
Potassium: 4.2 mmol/L (ref 3.5–5.2)
Sodium: 144 mmol/L (ref 134–144)
Total Protein: 6.5 g/dL (ref 6.0–8.5)
eGFR: 55 mL/min/{1.73_m2} — ABNORMAL LOW (ref >59)

## 2023-11-18 LAB — LIPID PANEL
Chol/HDL Ratio: 3 ratio (ref 0.0–5.0)
Cholesterol, Total: 130 mg/dL (ref 100–199)
HDL: 43 mg/dL (ref >39)
LDL Chol Calc (NIH): 65 mg/dL (ref 0–99)
Triglycerides: 121 mg/dL (ref 0–149)
VLDL Cholesterol Cal: 22 mg/dL (ref 5–40)

## 2023-11-22 ENCOUNTER — Ambulatory Visit: Payer: Medicare Other | Attending: Cardiology | Admitting: *Deleted

## 2023-11-22 DIAGNOSIS — Z5181 Encounter for therapeutic drug level monitoring: Secondary | ICD-10-CM | POA: Diagnosis not present

## 2023-11-22 DIAGNOSIS — I4821 Permanent atrial fibrillation: Secondary | ICD-10-CM | POA: Diagnosis not present

## 2023-11-22 LAB — POCT INR: INR: 1.9 — AB (ref 2.0–3.0)

## 2023-11-22 NOTE — Patient Instructions (Signed)
 Take warfarin 1 1/2 tablets tonight then resume 1 tablet daily except 1/2 tablet on Wednesdays.   Pending colonoscopy 3/21.  Will hold warfarin 5 days before procedure and resume night of procedure if OK with MD Continue greens Recheck 10 days after procedure

## 2023-11-24 ENCOUNTER — Encounter: Payer: Self-pay | Admitting: "Endocrinology

## 2023-11-24 ENCOUNTER — Ambulatory Visit: Payer: Medicare Other | Admitting: "Endocrinology

## 2023-11-24 VITALS — BP 136/74 | HR 64 | Ht 70.0 in | Wt 213.6 lb

## 2023-11-24 DIAGNOSIS — I10 Essential (primary) hypertension: Secondary | ICD-10-CM | POA: Diagnosis not present

## 2023-11-24 DIAGNOSIS — E1159 Type 2 diabetes mellitus with other circulatory complications: Secondary | ICD-10-CM

## 2023-11-24 DIAGNOSIS — Z794 Long term (current) use of insulin: Secondary | ICD-10-CM

## 2023-11-24 DIAGNOSIS — E782 Mixed hyperlipidemia: Secondary | ICD-10-CM | POA: Diagnosis not present

## 2023-11-24 LAB — POCT GLYCOSYLATED HEMOGLOBIN (HGB A1C): HbA1c, POC (controlled diabetic range): 7.1 % — AB (ref 0.0–7.0)

## 2023-11-24 MED ORDER — TOUJEO MAX SOLOSTAR 300 UNIT/ML ~~LOC~~ SOPN
50.0000 [IU] | PEN_INJECTOR | Freq: Every day | SUBCUTANEOUS | 0 refills | Status: DC
Start: 2023-11-24 — End: 2024-02-20

## 2023-11-24 NOTE — Progress Notes (Signed)
 11/24/2023  Endocrinology follow-up note   Subjective:    Patient ID: Daniel Schaefer, male    DOB: 09-Feb-1951,    Past Medical History:  Diagnosis Date   Atrial fibrillation [facility])    Reportedly diagnosed November 2011   Essential hypertension    History of kidney stones    Prostate cancer [facility])    Radiation implants   Type 2 diabetes mellitus (HCC)    Past Surgical History:  Procedure Laterality Date   Gold seed placement     INGUINAL HERNIA REPAIR Left 07/02/2020   Procedure: LEFT INGUINAL HERNIORRHAPY WITH MESH;  Surgeon: Franky Macho, MD;  Location: [facility];  Service: General;  Laterality: Left;   LITHOTRIPSY     PROSTATE BIOPSY     TRIGGER FINGER RELEASE Left    ring finger   URETEROSCOPY WITH HOLMIUM LASER LITHOTRIPSY     Social History   Socioeconomic History   Marital status: Divorced    Spouse name: Not on file   Number of children: Not on file   Years of education: Not on file   Highest education level: Not on file  Occupational History   Not on file  Tobacco Use   Smoking status: Never   Smokeless tobacco: Never  Vaping Use   Vaping status: Never Used  Substance and Sexual Activity   Alcohol use: No    Alcohol/week: 0.0 standard drinks of alcohol   Drug use: No   Sexual activity: Yes  Other Topics Concern   Not on file  Social History Narrative   Not on file   Social Drivers of Health   Financial Resource Strain: Not on file  Food Insecurity: Not on file  Transportation Needs: Not on file  Physical Activity: Not on file  Stress: Not on file  Social Connections: Not on file   Outpatient Encounter Medications as of 11/24/2023  Medication Sig   albuterol (VENTOLIN HFA) 108 (90 Base) MCG/ACT inhaler Inhale into the lungs.   amLODipine (NORVASC) 5 MG tablet Take 1 tablet by mouth once daily   Azelastine HCl 137 MCG/SPRAY SOLN Place 2 sprays into both nostrils daily.   Blood Glucose Monitoring Suppl (ACCU-CHEK GUIDE ME) w/Device KIT 1 Piece  by Does not apply route as directed.   Cholecalciferol (VITAMIN D3) 125 MCG (5000 UT) TABS Take 5,000 Units by mouth daily.    Flaxseed, Linseed, (FLAX SEEDS PO) Take by mouth.   furosemide (LASIX) 20 MG tablet Take 1 tablet by mouth once daily   GLOBAL EASE INJECT PEN NEEDLES 31G X 8 MM MISC USE 1 DAILY AS DIRECTED - RUN ON CASH $15 - PER PC.   glucose blood (ACCU-CHEK GUIDE TEST) test strip USE 1 STRIP TO CHECK GLUCOSE THREE TIMES DAILY   insulin glargine, 2 Unit Dial, (TOUJEO MAX SOLOSTAR) 300 UNIT/ML Solostar Pen Inject 50 Units into the skin at bedtime.   Insulin Pen Needle (B-D ULTRAFINE III SHORT PEN) 31G X 8 MM MISC 1 each by Does not apply route as directed. (Patient not taking: Reported on 10/26/2023)   Lancets MISC 1 each by Does not apply route as directed.   levocetirizine (XYZAL) 5 MG tablet SMARTSIG:1 Tablet(s) By Mouth Every Evening   melatonin 5 MG TABS Take 5 mg by mouth.   metFORMIN (GLUCOPHAGE) 500 MG tablet Take 1 tablet (500 mg total) by mouth daily with breakfast.   olmesartan (BENICAR) 40 MG tablet Take 1 tablet (40 mg total) by mouth daily. REFILLS TO PCP  Omega-3 Fat Ac-Cholecalciferol (SUPERIOR OMEGA3 W/ VITAMIN D PO) Take by mouth daily.   omeprazole (PRILOSEC) 20 MG capsule Take 20 mg by mouth daily.    oxymetazoline (AFRIN) 0.05 % nasal spray Place 1 spray into both nostrils 2 (two) times daily as needed for congestion.   potassium chloride (KLOR-CON) 10 MEQ tablet TAKE 1 TABLET BY MOUTH DAILY   pravastatin (PRAVACHOL) 40 MG tablet TAKE 1 TABLET BY MOUTH AT BEDTIME   tadalafil (CIALIS) 20 MG tablet Take 20 mg by mouth daily as needed for erectile dysfunction. (Patient not taking: Reported on 10/26/2023)   tamsulosin (FLOMAX) 0.4 MG CAPS capsule Take 1 capsule (0.4 mg total) by mouth daily.   warfarin (COUMADIN) 5 MG tablet TAKE 1 TABLET BY MOUTH ONCE DAILY EXCEPT TAKE 1/2 TABLET ON WEDNESDAYS OR AS DIRECTED   [DISCONTINUED] insulin glargine, 2 Unit Dial, (TOUJEO MAX  SOLOSTAR) 300 UNIT/ML Solostar Pen Inject 56 Units into the skin at bedtime. INJECT 64 UNITS SUBCUTANEOUSLY AT BEDTIME   No facility-administered encounter medications on file as of 11/24/2023.   ALLERGIES: Allergies  Allergen Reactions   Bactrim [Sulfamethoxazole-Trimethoprim] Other (See Comments)    Thin his blood really bad   Other     Cannot mix warfarin and bactrim    [name] Flextouch [Insulin Degludec] Rash   VACCINATION STATUS: Immunization History  Administered Date(s) Administered   [employer] Vaccination 10/04/2019, 11/05/2019    Diabetes He presents for his follow-up diabetic visit. He has type 2 diabetes mellitus. Onset time: He was diagnosed at approximate age of 13 years. His disease course has been worsening. There are no hypoglycemic associated symptoms. There are no diabetic associated symptoms. There are no hypoglycemic complications. Symptoms are worsening. Diabetic complications include nephropathy. Risk factors for coronary artery disease include hypertension, dyslipidemia, sedentary lifestyle, male sex and diabetes mellitus. Current diabetic treatment includes oral agent (monotherapy) and insulin injections. He is compliant with treatment most of the time. His weight is stable. He is following a generally unhealthy diet. When asked about meal planning, he reported none. He has not had a previous visit with a dietitian. He rarely participates in exercise. His home blood glucose trend is fluctuating minimally. His breakfast blood glucose range is generally 90-110 mg/dl. His bedtime blood glucose range is generally 130-140 mg/dl. His overall blood glucose range is 130-140 mg/dl. (He comes with his logs and his meter showing tight glycemic control at fasting, near target readings postprandially.  His point-of-care A1c 7.1% increasing from 6.2%.  He did have fasting hypoglycemia 1-2 times weekly.    ) An ACE inhibitor/angiotensin II receptor blocker is being taken. He  does not see a podiatrist.Eye exam is current.  Hypertension This is a chronic problem. The current episode started more than 1 year ago. The problem is unchanged. The problem is controlled. There are no associated agents to hypertension. Risk factors for coronary artery disease include diabetes mellitus, dyslipidemia, male gender and sedentary lifestyle. Past treatments include ACE inhibitors, calcium channel blockers, diuretics and beta blockers. The current treatment provides moderate improvement. There are no compliance problems.  Hypertensive end-organ damage includes kidney disease. Identifiable causes of hypertension include chronic renal disease.  Hyperlipidemia This is a chronic problem. The current episode started more than 1 year ago. The problem is controlled. Recent lipid tests were reviewed and are normal. Exacerbating diseases include chronic renal disease and diabetes. Factors aggravating his hyperlipidemia include beta blockers. Current antihyperlipidemic treatment includes statins. The current treatment provides moderate improvement of lipids.  There are no compliance problems.  Risk factors for coronary artery disease include dyslipidemia, diabetes mellitus, hypertension, male sex and a sedentary lifestyle.    Review of systems  Constitutional: + Minimally fluctuating body weight,  current Body mass index is 30.65 kg/m. , no fatigue, no subjective hyperthermia, no subjective hypothermia    Objective:    BP 136/74   Pulse 64   Ht 5\' 10"  (1.778 m)   Wt 213 lb 9.6 oz (96.9 kg)   BMI 30.65 kg/m   Wt Readings from Last 3 Encounters:  11/24/23 213 lb 9.6 oz (96.9 kg)  09/01/23 213 lb 9.6 oz (96.9 kg)  07/19/23 213 lb (96.6 kg)    BP Readings from Last 3 Encounters:  11/24/23 136/74  09/01/23 124/80  07/19/23 132/78     Physical Exam- Limited  Constitutional:  Body mass index is 30.65 kg/m. , not in acute distress, normal state of mind Eyes:  EOMI, no  exophthalmos     Results for orders placed or performed in visit on 11/24/23  HgB A1c   Collection Time: 11/24/23  3:13 PM  Result Value Ref Range   Hemoglobin A1C     HbA1c POC (<> result, manual entry)     HbA1c, POC (prediabetic range)     HbA1c, POC (controlled diabetic range) 7.1 (A) 0.0 - 7.0 %   Complete Blood Count (Most recent): Lab Results  Component Value Date   WBC 10.2 09/27/2022   HGB 11.9 (L) 09/27/2022   HCT 38.5 (L) 09/27/2022   MCV 76.4 (L) 09/27/2022   PLT 269 09/27/2022   Chemistry (most recent): Lab Results  Component Value Date   NA 144 11/17/2023   K 4.2 11/17/2023   CL 104 11/17/2023   CO2 24 11/17/2023   BUN 18 11/17/2023   CREATININE 1.34 (H) 11/17/2023   Lipid Panel     Component Value Date/Time   CHOL 130 11/17/2023 0820   TRIG 121 11/17/2023 0820   HDL 43 11/17/2023 0820   CHOLHDL 3.0 11/17/2023 0820   LDLCALC 65 11/17/2023 0820    Assessment & Plan:   1) Type 2 diabetes mellitus with stage 3 renal insufficiency, with long-term current use of insulin (HCC)  He comes with his logs and his meter showing tight glycemic control at fasting, near target readings postprandially.  His point-of-care A1c 7.1% increasing from 6.2%.  He did have fasting hypoglycemia 1-2 times weekly.    -Patient remains at a high risk for more acute and chronic complications of diabetes which include CAD, CVA, CKD, retinopathy, and neuropathy. These are all discussed in detail with the patient.  -Recent labs reviewed showing improved renal function.  - Nutritional counseling repeated at each appointment due to patients tendency to fall back in to old habits.   - he acknowledges that there is a room for improvement in his food and drink choices. - Suggestion is made for him to avoid simple carbohydrates  from his diet including Cakes, Sweet Desserts, Ice Cream, Soda (diet and regular), Sweet Tea, Candies, Chips, Cookies, Store Bought Juices, Alcohol ,  Artificial Sweeteners,  Coffee Creamer, and "Sugar-free" Products, Lemonade. This will help patient to have more stable blood glucose profile and potentially avoid unintended weight gain.   The following Lifestyle Medicine recommendations according to American College of Lifestyle Medicine  [facility]) were discussed and and offered to patient and he  agrees to start the journey:  A. Whole Foods, Plant-Based Nutrition comprising of fruits and  vegetables, plant-based proteins, whole-grain carbohydrates was discussed in detail with the patient.   A list for source of those nutrients were also provided to the patient.  Patient will use only water or unsweetened tea for hydration. B.  The need to stay away from risky substances including alcohol, smoking; obtaining 7 to 9 hours of restorative sleep, at least 150 minutes of moderate intensity exercise weekly, the importance of healthy social connections.  -He is following with Norm Salt, CDE for diabetes education.  - He presents with fasting hypoglycemia, advised to lower his Toujeo to 50 units nightly associated with monitoring of blood glucose twice a day-daily before breakfast and at bedtime.      He worries about cost of medications, previously declined offer for Trulicity/Ozempic weekly injections.   He has sulfa allergy, not a candidate for glipizide.  His recent CMP showed CKD, and new development for him.  I advised him to lower metformin to 500 mg p.o. daily at breakfast.     This patient would have benefited from a CGM.  He is still researching and will let me know if you would like to go on a CGM.   -In the meantime, he is encouraged to continue monitoring blood glucose twice daily, before breakfast and before bed, and to call the clinic if he has readings less than 70 or greater than 150 mg per DL 3 days in a week at fasting.    - He is generally hesitant to add medications.  - Patient specific target  for A1c; LDL, HDL, Triglycerides,  and  Waist Circumference were discussed in detail.  2) BP/HTN: -His blood pressure is controlled to target.  He is advised to continue Norvasc 5 mg po daily, Lasix 20 mg po daily, Lisinopril 20 mg po daily, and Metoprolol 25 mg po twice daily.  3) Lipids/HPL:  His most recent lipid panel showed continued control of his LDL at 65.  He is advised to continue pravastatin 40 mg p.o. nightly.   Side effects and precautions discussed with him.  He recently had his labs drawn at his PCP, will request copy.  4)  Weight/Diet:  His Body mass index is 30.65 kg/m.-candidate for some weight loss.  He is following with Norm Salt, CD for diabetes education.  No success in weight loss,  exercise, and carbohydrates information provided.  5) Chronic Care/Health Maintenance: -Patient on ACEI and Statin medications and encouraged to continue to follow up with Ophthalmology, Podiatrist at least yearly or according to recommendations, and advised to stay away from smoking. I have recommended yearly flu vaccine and pneumonia vaccination at least every 5 years; moderate intensity exercise for up to 150 minutes weekly; and  sleep for at least 7 hours a day.   He is advised to maintain close follow-up with his PMD  for primary care needs.   I spent  26  minutes in the care of the patient today including review of labs from CMP, Lipids, Thyroid Function, Hematology (current and previous including abstractions from other facilities); face-to-face time discussing  his blood glucose readings/logs, discussing hypoglycemia and hyperglycemia episodes and symptoms, medications doses, his options of short and long term treatment based on the latest standards of care / guidelines;  discussion about incorporating lifestyle medicine;  and documenting the encounter. Risk reduction counseling performed per USPSTF guidelines to reduce cardiovascular risk factors.     Please refer to Patient Instructions for Blood Glucose  Monitoring and Insulin/Medications Dosing Guide"  in media  tab for additional information. Please  also refer to " Patient Self Inventory" in the Media  tab for reviewed elements of pertinent patient history.  Daniel Schaefer participated in the discussions, expressed understanding, and voiced agreement with the above plans.  All questions were answered to his satisfaction. he is encouraged to contact clinic should he have any questions or concerns prior to his return visit.     Follow up plan: Return in about 4 months (around 03/25/2024) for F/U with Pre-visit Labs, Meter/CGM/Logs, A1c here.  Ronny Bacon, [facility] [address] [place], [place] [address] Phone: [phone] Fax: [phone]  11/24/2023, 5:54 PM

## 2023-11-24 NOTE — Patient Instructions (Signed)

## 2023-11-28 NOTE — Patient Instructions (Signed)
 Daniel Schaefer  11/28/2023     @PREFPERIOPPHARMACY @   Your procedure is scheduled on  12/02/2023.   Report to [facility] at  0600  A.M.   Call this number if you have problems the morning of surgery:  [phone]  If you experience any cold or flu symptoms such as cough, fever, chills, shortness of breath, etc. between now and your scheduled surgery, please notify us at the above number.   Remember:        Your last dose of coumadin should have been on 11/26/2023.        Take 1/2 (25u)of your night time toujeo the night before your procedure.        DO NOT take any medications for diabetes the morning of your procedure.       Use your inhalers before you come and bring your rescue inhaler with you.    Follow the diet and prep instructions given to you by the office.    You may drink clear liquids until  0330 am on 12/02/2023.    Clear liquids allowed are:                    Water, Juice (No red color; non-citric and without pulp; diabetics please choose diet or no sugar options), Carbonated beverages (diabetics please choose diet or no sugar options), Clear Tea (No creamer, milk, or cream, including half & half and powdered creamer), Black Coffee Only (No creamer, milk or cream, including half & half and powdered creamer), and Clear Sports drink (No red color; diabetics please choose diet or no sugar options)    Take these medicines the morning of surgery with A SIP OF WATER                              amlodipine, omeprazole, tamsulosin.    Do not wear jewelry, make-up or nail polish, including gel polish,  artificial nails, or any other type of covering on natural nails (fingers and  toes).  Do not wear lotions, powders, or perfumes, or deodorant.  Do not shave 48 hours prior to surgery.  Men may shave face and neck.  Do not bring valuables to the hospital.  [facility] is not responsible for any belongings or valuables.  Contacts, dentures or bridgework may  not be worn into surgery.  Leave your suitcase in the car.  After surgery it may be brought to your room.  For patients admitted to the hospital, discharge time will be determined by your treatment team.  Patients discharged the day of surgery will not be allowed to drive home and must have someone with them for 24 hours.    Special instructions:   DO NOT smoke tobacco or vape for 24 hours before your procedure.  Please read over the following fact sheets that you were given. Anesthesia Post-op Instructions and Care and Recovery After Surgery      Colonoscopy, Adult, Care After The following information offers guidance on how to care for yourself after your procedure. Your health care provider may also give you more specific instructions. If you have problems or questions, contact your health care provider. What can I expect after the procedure? After the procedure, it is common to have: A small amount of blood in your stool for 24 hours after the procedure. Some gas. Mild cramping or bloating of your abdomen. Follow these instructions  at home: Eating and drinking  Drink enough fluid to keep your urine pale yellow. Follow instructions from your health care provider about eating or drinking restrictions. Resume your normal diet as told by your health care provider. Avoid heavy or fried foods that are hard to digest. Activity Rest as told by your health care provider. Avoid sitting for a long time without moving. Get up to take short walks every 1-2 hours. This is important to improve blood flow and breathing. Ask for help if you feel weak or unsteady. Return to your normal activities as told by your health care provider. Ask your health care provider what activities are safe for you. Managing cramping and bloating  Try walking around when you have cramps or feel bloated. If directed, apply heat to your abdomen as told by your health care provider. Use the heat source that your health  care provider recommends, such as a moist heat pack or a heating pad. Place a towel between your skin and the heat source. Leave the heat on for 20-30 minutes. Remove the heat if your skin turns bright red. This is especially important if you are unable to feel pain, heat, or cold. You have a greater risk of getting burned. General instructions If you were given a sedative during the procedure, it can affect you for several hours. Do not drive or operate machinery until your health care provider says that it is safe. For the first 24 hours after the procedure: Do not sign important documents. Do not drink alcohol. Do your regular daily activities at a slower pace than normal. Eat soft foods that are easy to digest. Take over-the-counter and prescription medicines only as told by your health care provider. Keep all follow-up visits. This is important. Contact a health care provider if: You have blood in your stool 2-3 days after the procedure. Get help right away if: You have more than a small spotting of blood in your stool. You have large blood clots in your stool. You have swelling of your abdomen. You have nausea or vomiting. You have a fever. You have increasing pain in your abdomen that is not relieved with medicine. These symptoms may be an emergency. Get help right away. Call 911. Do not wait to see if the symptoms will go away. Do not drive yourself to the hospital. Summary After the procedure, it is common to have a small amount of blood in your stool. You may also have mild cramping and bloating of your abdomen. If you were given a sedative during the procedure, it can affect you for several hours. Do not drive or operate machinery until your health care provider says that it is safe. Get help right away if you have a lot of blood in your stool, nausea or vomiting, a fever, or increased pain in your abdomen. This information is not intended to replace advice given to you by  your health care provider. Make sure you discuss any questions you have with your health care provider. Document Revised: 10/12/2022 Document Reviewed: 04/22/2021 Elsevier Patient Education  2024 [employer].General Anesthesia, Adult, Care After The following information offers guidance on how to care for yourself after your procedure. Your health care provider may also give you more specific instructions. If you have problems or questions, contact your health care provider. What can I expect after the procedure? After the procedure, it is common for people to: Have pain or discomfort at the IV site. Have nausea or vomiting. Have  a sore throat or hoarseness. Have trouble concentrating. Feel cold or chills. Feel weak, sleepy, or tired (fatigue). Have soreness and body aches. These can affect parts of the body that were not involved in surgery. Follow these instructions at home: For the time period you were told by your health care provider:  Rest. Do not participate in activities where you could fall or become injured. Do not drive or use machinery. Do not drink alcohol. Do not take sleeping pills or medicines that cause drowsiness. Do not make important decisions or sign legal documents. Do not take care of children on your own. General instructions Drink enough fluid to keep your urine pale yellow. If you have sleep apnea, surgery and certain medicines can increase your risk for breathing problems. Follow instructions from your health care provider about wearing your sleep device: Anytime you are sleeping, including during daytime naps. While taking prescription pain medicines, sleeping medicines, or medicines that make you drowsy. Return to your normal activities as told by your health care provider. Ask your health care provider what activities are safe for you. Take over-the-counter and prescription medicines only as told by your health care provider. Do not use any products that  contain nicotine or tobacco. These products include cigarettes, chewing tobacco, and vaping devices, such as e-cigarettes. These can delay incision healing after surgery. If you need help quitting, ask your health care provider. Contact a health care provider if: You have nausea or vomiting that does not get better with medicine. You vomit every time you eat or drink. You have pain that does not get better with medicine. You cannot urinate or have bloody urine. You develop a skin rash. You have a fever. Get help right away if: You have trouble breathing. You have chest pain. You vomit blood. These symptoms may be an emergency. Get help right away. Call 911. Do not wait to see if the symptoms will go away. Do not drive yourself to the hospital. Summary After the procedure, it is common to have a sore throat, hoarseness, nausea, vomiting, or to feel weak, sleepy, or fatigue. For the time period you were told by your health care provider, do not drive or use machinery. Get help right away if you have difficulty breathing, have chest pain, or vomit blood. These symptoms may be an emergency. This information is not intended to replace advice given to you by your health care provider. Make sure you discuss any questions you have with your health care provider. Document Revised: 11/27/2021 Document Reviewed: 11/27/2021 Elsevier Patient Education  2024 [employer].

## 2023-11-29 ENCOUNTER — Encounter (HOSPITAL_COMMUNITY)
Admission: RE | Admit: 2023-11-29 | Discharge: 2023-11-29 | Disposition: A | Discharge status: Home / Self Care | Payer: Medicare Other | Source: Ambulatory Visit | Attending: Gastroenterology | Admitting: Gastroenterology

## 2023-11-29 ENCOUNTER — Encounter (HOSPITAL_COMMUNITY): Payer: Self-pay

## 2023-11-29 ENCOUNTER — Other Ambulatory Visit: Payer: Self-pay

## 2023-11-29 VITALS — BP 136/74 | HR 64 | Temp 98.0°F | Resp 18 | Ht 70.0 in | Wt 213.6 lb

## 2023-11-29 DIAGNOSIS — I10 Essential (primary) hypertension: Secondary | ICD-10-CM | POA: Diagnosis not present

## 2023-11-29 DIAGNOSIS — Z01810 Encounter for preprocedural cardiovascular examination: Secondary | ICD-10-CM | POA: Diagnosis not present

## 2023-11-29 DIAGNOSIS — I4891 Unspecified atrial fibrillation: Secondary | ICD-10-CM | POA: Diagnosis not present

## 2023-11-29 NOTE — Progress Notes (Signed)
 Dr.Ahmed notified patient took dose of Coumadin yesterday 11/28/23, with colonoscopy scheduled on 12/02/23. Per Dr.Ahmed okay to proceed as scheduled. Patient called and notified of this information. Patient is aware not to take any more Coumadin between now and procedure.

## 2023-12-01 NOTE — Anesthesia Preprocedure Evaluation (Signed)
 Anesthesia Evaluation  Patient identified by MRN, date of birth, ID band Patient awake    Reviewed: Allergy & Precautions, H&P , NPO status , Patient's Chart, lab work & pertinent test results, reviewed documented beta blocker date and time   Airway Mallampati: II  TM Distance: >3 FB Neck ROM: full    Dental no notable dental hx. (+) Dental Advisory Given, Teeth Intact   Pulmonary neg pulmonary ROS   Pulmonary exam normal breath sounds clear to auscultation       Cardiovascular Exercise Tolerance: Good hypertension, Normal cardiovascular exam+ dysrhythmias Atrial Fibrillation  Rhythm:irregular Rate:Normal     Neuro/Psych negative neurological ROS  negative psych ROS   GI/Hepatic negative GI ROS, Neg liver ROS,,,  Endo/Other  diabetes, Type 2    Renal/GU negative Renal ROS  negative genitourinary   Musculoskeletal   Abdominal   Peds  Hematology negative hematology ROS (+)   Anesthesia Other Findings   Reproductive/Obstetrics negative OB ROS                             Anesthesia Physical Anesthesia Plan  ASA: 3  Anesthesia Plan: General   Post-op Pain Management:    Induction: Intravenous  PONV Risk Score and Plan: Propofol infusion  Airway Management Planned: Nasal Cannula and Natural Airway  Additional Equipment: None  Intra-op Plan:   Post-operative Plan:   Informed Consent: I have reviewed the patients History and Physical, chart, labs and discussed the procedure including the risks, benefits and alternatives for the proposed anesthesia with the patient or authorized representative who has indicated his/her understanding and acceptance.     Dental Advisory Given  Plan Discussed with: CRNA  Anesthesia Plan Comments:         Anesthesia Quick Evaluation

## 2023-12-02 ENCOUNTER — Encounter (HOSPITAL_COMMUNITY)
Admission: RE | Disposition: A | Discharge status: Home / Self Care | Payer: Self-pay | Source: Home / Self Care | Attending: Gastroenterology

## 2023-12-02 ENCOUNTER — Ambulatory Visit (HOSPITAL_COMMUNITY): Admitting: Anesthesiology

## 2023-12-02 ENCOUNTER — Ambulatory Visit (HOSPITAL_COMMUNITY)
Admission: RE | Admit: 2023-12-02 | Discharge: 2023-12-02 | Disposition: A | Discharge status: Home / Self Care | Payer: Medicare Other | Attending: Gastroenterology | Admitting: Gastroenterology

## 2023-12-02 ENCOUNTER — Encounter (HOSPITAL_COMMUNITY): Payer: Self-pay | Admitting: Gastroenterology

## 2023-12-02 ENCOUNTER — Encounter (INDEPENDENT_AMBULATORY_CARE_PROVIDER_SITE_OTHER): Payer: Self-pay | Admitting: *Deleted

## 2023-12-02 DIAGNOSIS — Z7984 Long term (current) use of oral hypoglycemic drugs: Secondary | ICD-10-CM | POA: Insufficient documentation

## 2023-12-02 DIAGNOSIS — K5730 Diverticulosis of large intestine without perforation or abscess without bleeding: Secondary | ICD-10-CM

## 2023-12-02 DIAGNOSIS — D125 Benign neoplasm of sigmoid colon: Secondary | ICD-10-CM

## 2023-12-02 DIAGNOSIS — K633 Ulcer of intestine: Secondary | ICD-10-CM | POA: Diagnosis not present

## 2023-12-02 DIAGNOSIS — K5140 Inflammatory polyps of colon without complications: Secondary | ICD-10-CM | POA: Diagnosis not present

## 2023-12-02 DIAGNOSIS — Z139 Encounter for screening, unspecified: Secondary | ICD-10-CM | POA: Diagnosis not present

## 2023-12-02 DIAGNOSIS — D1779 Benign lipomatous neoplasm of other sites: Secondary | ICD-10-CM | POA: Insufficient documentation

## 2023-12-02 DIAGNOSIS — K648 Other hemorrhoids: Secondary | ICD-10-CM | POA: Insufficient documentation

## 2023-12-02 DIAGNOSIS — I4891 Unspecified atrial fibrillation: Secondary | ICD-10-CM | POA: Insufficient documentation

## 2023-12-02 DIAGNOSIS — I10 Essential (primary) hypertension: Secondary | ICD-10-CM | POA: Diagnosis not present

## 2023-12-02 DIAGNOSIS — Z7901 Long term (current) use of anticoagulants: Secondary | ICD-10-CM | POA: Diagnosis not present

## 2023-12-02 DIAGNOSIS — E119 Type 2 diabetes mellitus without complications: Secondary | ICD-10-CM | POA: Diagnosis not present

## 2023-12-02 DIAGNOSIS — D175 Benign lipomatous neoplasm of intra-abdominal organs: Secondary | ICD-10-CM | POA: Diagnosis not present

## 2023-12-02 DIAGNOSIS — Z1211 Encounter for screening for malignant neoplasm of colon: Secondary | ICD-10-CM

## 2023-12-02 DIAGNOSIS — Z794 Long term (current) use of insulin: Secondary | ICD-10-CM | POA: Diagnosis not present

## 2023-12-02 DIAGNOSIS — Q439 Congenital malformation of intestine, unspecified: Secondary | ICD-10-CM | POA: Diagnosis not present

## 2023-12-02 DIAGNOSIS — D123 Benign neoplasm of transverse colon: Secondary | ICD-10-CM

## 2023-12-02 DIAGNOSIS — K635 Polyp of colon: Secondary | ICD-10-CM | POA: Diagnosis not present

## 2023-12-02 HISTORY — PX: HEMOSTASIS CLIP PLACEMENT: SHX6857

## 2023-12-02 HISTORY — PX: COLONOSCOPY WITH PROPOFOL: SHX5780

## 2023-12-02 HISTORY — PX: POLYPECTOMY: SHX5525

## 2023-12-02 LAB — HM COLONOSCOPY

## 2023-12-02 LAB — GLUCOSE, CAPILLARY
Glucose-Capillary: 89 mg/dL (ref 70–99)
Glucose-Capillary: 87 mg/dL (ref 70–99)

## 2023-12-02 SURGERY — COLONOSCOPY WITH PROPOFOL
Anesthesia: General

## 2023-12-02 MED ORDER — PROPOFOL 10 MG/ML IV BOLUS
INTRAVENOUS | Status: DC | PRN
  Administered 2023-12-02: 75 mg via INTRAVENOUS

## 2023-12-02 MED ORDER — PROPOFOL 500 MG/50ML IV EMUL
INTRAVENOUS | Status: DC | PRN
  Administered 2023-12-02: 150 ug/kg/min via INTRAVENOUS

## 2023-12-02 MED ORDER — LIDOCAINE HCL (CARDIAC) PF 100 MG/5ML IV SOSY
PREFILLED_SYRINGE | INTRAVENOUS | Status: DC | PRN
  Administered 2023-12-02: 50 mg via INTRATRACHEAL

## 2023-12-02 MED ORDER — LACTATED RINGERS IV SOLN: INTRAVENOUS | Status: DC | PRN

## 2023-12-02 MED ORDER — SODIUM CHLORIDE 0.9% FLUSH: 3.0000 mL | Freq: Two times a day (BID) | INTRAVENOUS | Status: DC

## 2023-12-02 MED ORDER — SODIUM CHLORIDE 0.9% FLUSH: 3.0000 mL | INTRAVENOUS | Status: DC | PRN

## 2023-12-02 NOTE — Op Note (Signed)
 [facility] Patient Name: Daniel Schaefer Procedure Date: 12/02/2023 7:01 AM MRN: [id number] Date of Birth: 1950/12/13 Attending MD: Sanjuan Dame , MD, [id number] CSN: [id number] Age: 73 Admit Type: Outpatient Procedure:                Colonoscopy Indications:              Screening for colorectal malignant neoplasm Providers:                Sanjuan Dame, MD, Edrick Kins, RN, Elinor Parkinson Referring MD:              Medicines:                Monitored Anesthesia Care Complications:            No immediate complications. Estimated Blood Loss:     Estimated blood loss: none. Procedure:                Pre-Anesthesia Assessment:                           - Prior to the procedure, a History and Physical                            was performed, and patient medications and                            allergies were reviewed. The patient's tolerance of                            previous anesthesia was also reviewed. The risks                            and benefits of the procedure and the sedation                            options and risks were discussed with the patient.                            All questions were answered, and informed consent                            was obtained. Prior Anticoagulants: The patient has                            taken Coumadin (warfarin), last dose was 5 days                            prior to procedure. ASA Grade Assessment: III - A                            patient with severe systemic disease. After  reviewing the risks and benefits, the patient was                            deemed in satisfactory condition to undergo the                            procedure.                           After obtaining informed consent, the colonoscope                            was passed under direct vision. Throughout the                            procedure, the patient's blood pressure,  pulse, and                            oxygen saturations were monitored continuously. The                            [id number]) scope was introduced through                            the anus and advanced to the the terminal ileum.                            The colonoscopy was performed without difficulty.                            The patient tolerated the procedure well. The                            quality of the bowel preparation was evaluated                            using the BBPS [facility] Bowel Preparation Scale)                            with scores of: Right Colon = 3, Transverse Colon =                            3 and Left Colon = 3 (entire mucosa seen well with                            no residual staining, small fragments of stool or                            opaque liquid). The total BBPS score equals 9. The                            terminal ileum, ileocecal valve, appendiceal  orifice, and rectum were photographed. Scope In: 7:51:32 AM Scope Out: 8:25:21 AM Scope Withdrawal Time: 0 hours 28 minutes 55 seconds  Total Procedure Duration: 0 hours 33 minutes 49 seconds  Findings:      The perianal and digital rectal examinations were normal.      A 12 mm polyp was found in the transverse colon. The polyp was sessile.       The polyp was removed with a hot snare. Resection and retrieval were       complete. To prevent bleeding after the polypectomy, one hemostatic clip       was successfully placed (MR conditional). Clip manufacturer: [employer]. There was no bleeding at the end of the procedure.      There was a small lipoma, in the ascending colon.      A few small-mouthed diverticula were found in the left colon.      The left colon was tortuous.      A 5 mm polyp was found in the sigmoid colon. The polyp was sessile. The       polyp was removed with a cold snare. Resection and retrieval were       complete.       Non-bleeding internal hemorrhoids were found during retroflexion. The       hemorrhoids were small.      The terminal ileum appeared normal. Impression:               - One 12 mm polyp in the transverse colon, removed                            with a hot snare. Resected and retrieved. Clip (MR                            conditional) was placed. Clip manufacturer: [employer].                           - Small lipoma in the ascending colon.                           - Diverticulosis in the left colon.                           - Tortuous colon.                           - One 5 mm polyp in the sigmoid colon, removed with                            a cold snare. Resected and retrieved.                           - Non-bleeding internal hemorrhoids.                           - The examined portion of the ileum was normal. Moderate Sedation:  Per Anesthesia Care Recommendation:           - Patient has a contact number available for                            emergencies. The signs and symptoms of potential                            delayed complications were discussed with the                            patient. Return to normal activities tomorrow.                            Written discharge instructions were provided to the                            patient.                           - Resume previous diet.                           - Continue present medications.                           - Await pathology results.                           - Repeat colonoscopy in 3 years for surveillance                            based on pathology results.                           - Return to primary care physician as previously                            scheduled. Procedure Code(s):        --- Professional ---                           [phone], Colonoscopy, flexible; with removal of                            tumor(s), polyp(s), or other lesion(s) by snare                             technique Diagnosis Code(s):        --- Professional ---                           Z12.11, Encounter for screening for malignant                            neoplasm of colon  D12.3, Benign neoplasm of transverse colon (hepatic                            flexure or splenic flexure)                           D12.5, Benign neoplasm of sigmoid colon                           D17.5, Benign lipomatous neoplasm of                            intra-abdominal organs                           K64.8, Other hemorrhoids                           K57.30, Diverticulosis of large intestine without                            perforation or abscess without bleeding                           Q43.8, Other specified congenital malformations of                            intestine CPT copyright 2022 American Medical Association. All rights reserved. The codes documented in this report are preliminary and upon coder review may  be revised to meet current compliance requirements. Sanjuan Dame, MD Sanjuan Dame, MD 12/02/2023 8:31:58 AM This report has been signed electronically. Number of Addenda: 0

## 2023-12-02 NOTE — Anesthesia Postprocedure Evaluation (Signed)
 Anesthesia Post Note  Patient: Daniel Schaefer  Procedure(s) Performed: COLONOSCOPY WITH PROPOFOL POLYPECTOMY CONTROL OF HEMORRHAGE, GI TRACT, ENDOSCOPIC, BY CLIPPING OR OVERSEWING  Patient location during evaluation: PACU Anesthesia Type: General Level of consciousness: awake and alert Pain management: pain level controlled Vital Signs Assessment: post-procedure vital signs reviewed and stable Respiratory status: spontaneous breathing, nonlabored ventilation, respiratory function stable and patient connected to nasal cannula oxygen Cardiovascular status: blood pressure returned to baseline and stable Postop Assessment: no apparent nausea or vomiting Anesthetic complications: no   There were no known notable events for this encounter.   Last Vitals:  Vitals:   12/02/23 0632 12/02/23 0830  BP: (!) 159/96 (!) 103/48  Pulse: 61   Resp: 18   Temp: 36.6 C (!) 35.8 C  SpO2: 100% 98%    Last Pain:  Vitals:   12/02/23 0745  TempSrc:   PainSc: 0-No pain                 Konnar Ben L Teale Goodgame

## 2023-12-02 NOTE — Anesthesia Postprocedure Evaluation (Signed)
 Anesthesia Post Note  Patient: Daniel Schaefer  Procedure(s) Performed: COLONOSCOPY WITH PROPOFOL POLYPECTOMY CONTROL OF HEMORRHAGE, GI TRACT, ENDOSCOPIC, BY CLIPPING OR OVERSEWING  Patient location during evaluation: Short Stay Anesthesia Type: General Level of consciousness: awake and alert Pain management: pain level controlled Vital Signs Assessment: post-procedure vital signs reviewed and stable Respiratory status: spontaneous breathing Cardiovascular status: blood pressure returned to baseline and stable Postop Assessment: no apparent nausea or vomiting Anesthetic complications: no   No notable events documented.   Last Vitals:  Vitals:   12/02/23 0632 12/02/23 0830  BP: (!) 159/96 (!) 103/48  Pulse: 61   Resp: 18   Temp: 36.6 C (!) 35.8 C  SpO2: 100% 98%    Last Pain:  Vitals:   12/02/23 0745  TempSrc:   PainSc: 0-No pain                 Arris Meyn

## 2023-12-02 NOTE — H&P (Signed)
 Primary Care Physician:  Toma Deiters, MD Primary Gastroenterologist:  Dr. Tasia Catchings  Pre-Procedure History & Physical: HPI:  Daniel Schaefer is a 73 y.o. male is here for a colonoscopy for colon cancer screening purposes.  Patient denies any family history of colorectal cancer.  No melena or hematochezia.  No abdominal pain or unintentional weight loss.  No change in bowel habits.  Overall feels well from a GI standpoint.  Past Medical History:  Diagnosis Date   Atrial fibrillation [facility])    Reportedly diagnosed November 2011   Essential hypertension    History of kidney stones    Prostate cancer [facility])    Radiation implants   Type 2 diabetes mellitus (HCC)     Past Surgical History:  Procedure Laterality Date   Gold seed placement     INGUINAL HERNIA REPAIR Left 07/02/2020   Procedure: LEFT INGUINAL HERNIORRHAPY WITH MESH;  Surgeon: Franky Macho, MD;  Location: [facility];  Service: General;  Laterality: Left;   LITHOTRIPSY     PROSTATE BIOPSY     TRIGGER FINGER RELEASE Left    ring finger   URETEROSCOPY WITH HOLMIUM LASER LITHOTRIPSY      Prior to Admission medications   Medication Sig Start Date End Date Taking? Authorizing Provider  albuterol (VENTOLIN HFA) 108 (90 Base) MCG/ACT inhaler Inhale into the lungs.   Yes [provider]  amLODipine (NORVASC) 5 MG tablet Take 1 tablet by mouth once daily 11/07/23  Yes Jonelle Sidle, MD  Azelastine HCl 137 MCG/SPRAY SOLN Place 2 sprays into both nostrils daily. 02/26/21  Yes [provider]  Blood Glucose Monitoring Suppl (ACCU-CHEK GUIDE ME) w/Device KIT 1 Piece by Does not apply route as directed. 10/05/21  Yes [name], Denman George, MD  Cholecalciferol (VITAMIN D3) 125 MCG (5000 UT) TABS Take 5,000 Units by mouth daily.    Yes [provider]  Flaxseed, Linseed, (FLAX SEEDS PO) Take by mouth.   Yes [provider]  furosemide (LASIX) 20 MG tablet Take 1 tablet by mouth once daily 09/26/23  Yes  Jonelle Sidle, MD  GLOBAL EASE INJECT PEN NEEDLES 31G X 8 MM MISC USE 1 DAILY AS DIRECTED - RUN ON CASH $15 - PER PC. 12/21/17  Yes [name], Denman George, MD  glucose blood (ACCU-CHEK GUIDE TEST) test strip USE 1 STRIP TO CHECK GLUCOSE THREE TIMES DAILY 10/18/23  Yes [name], Denman George, MD  Chilton Si Tea, Camellia sinensis, (GREEN TEA EXTRACT PO) Take 500 mg by mouth daily.   Yes [provider]  insulin glargine, 2 Unit Dial, (TOUJEO MAX SOLOSTAR) 300 UNIT/ML Solostar Pen Inject 50 Units into the skin at bedtime. 11/24/23  Yes [name], Denman George, MD  Insulin Pen Needle (B-D ULTRAFINE III SHORT PEN) 31G X 8 MM MISC 1 each by Does not apply route as directed. 06/01/18  Yes Roma Kayser, MD  Lancets MISC 1 each by Does not apply route as directed. 10/05/16  Yes [name], Denman George, MD  levocetirizine (XYZAL) 5 MG tablet SMARTSIG:1 Tablet(s) By Mouth Every Evening 04/06/22  Yes [provider]  melatonin 5 MG TABS Take 5 mg by mouth.   Yes [provider]  metFORMIN (GLUCOPHAGE) 500 MG tablet Take 1 tablet (500 mg total) by mouth daily with breakfast. 11/14/23  Yes [name], Denman George, MD  olmesartan (BENICAR) 40 MG tablet Take 1 tablet (40 mg total) by mouth daily. REFILLS TO PCP 09/29/23  Yes Nyoka Cowden, MD  Omega-3 Fat  Ac-Cholecalciferol (SUPERIOR OMEGA3 W/ VITAMIN D PO) Take by mouth daily.   Yes [provider]  omeprazole (PRILOSEC) 20 MG capsule Take 20 mg by mouth daily.  12/24/19  Yes [provider]  potassium chloride (KLOR-CON) 10 MEQ tablet TAKE 1 TABLET BY MOUTH DAILY 10/13/23  Yes Jonelle Sidle, MD  pravastatin (PRAVACHOL) 40 MG tablet TAKE 1 TABLET BY MOUTH AT BEDTIME 02/16/23  Yes Jonelle Sidle, MD  Selenium 200 MCG CAPS Take 1 capsule by mouth daily.   Yes [provider]  tamsulosin (FLOMAX) 0.4 MG CAPS capsule Take 1 capsule (0.4 mg total) by mouth daily. 03/24/23  Yes Bjorn Pippin, MD  Zinc 50 MG TABS Take by  mouth.   Yes [provider]  oxymetazoline (AFRIN) 0.05 % nasal spray Place 1 spray into both nostrils 2 (two) times daily as needed for congestion.    [provider]  tadalafil (CIALIS) 20 MG tablet Take 20 mg by mouth daily as needed for erectile dysfunction. Patient not taking: Reported on 10/26/2023    [provider]  warfarin (COUMADIN) 5 MG tablet TAKE 1 TABLET BY MOUTH ONCE DAILY EXCEPT TAKE 1/2 TABLET ON [facility] OR AS DIRECTED 10/11/23   Jonelle Sidle, MD    Allergies as of 10/31/2023 - Review Complete 10/26/2023  Allergen Reaction Noted   Bactrim [sulfamethoxazole-trimethoprim] Other (See Comments) 12/05/2017   Other  02/18/2016   [name] flextouch [insulin degludec] Rash 01/06/2017    Family History  Problem Relation Age of Onset   Cancer Father 87       Laryngeal   Lymphoma Mother 62    Social History   Socioeconomic History   Marital status: Divorced    Spouse name: Not on file   Number of children: Not on file   Years of education: Not on file   Highest education level: Not on file  Occupational History   Not on file  Tobacco Use   Smoking status: Never   Smokeless tobacco: Never  Vaping Use   Vaping status: Never Used  Substance and Sexual Activity   Alcohol use: No    Alcohol/week: 0.0 standard drinks of alcohol   Drug use: No   Sexual activity: Yes  Other Topics Concern   Not on file  Social History Narrative   Not on file   Social Drivers of Health   Financial Resource Strain: Not on file  Food Insecurity: Not on file  Transportation Needs: Not on file  Physical Activity: Not on file  Stress: Not on file  Social Connections: Not on file  Intimate Partner Violence: Not on file    Review of Systems: See HPI, otherwise negative ROS  Physical Exam: Vital signs in last 24 hours: Temp:  [97.8 F (36.6 C)] 97.8 F (36.6 C) (03/21 0632) Pulse Rate:  [61] 61 (03/21 0632) Resp:  [18] 18 (03/21 0632) BP:  (159)/(96) 159/96 (03/21 0632) SpO2:  [100 %] 100 % (03/21 9811) Weight:  [96.8 kg] 96.8 kg (03/21 9147)   General:   Alert,  Well-developed, well-nourished, pleasant and cooperative in NAD Head:  Normocephalic and atraumatic. Eyes:  Sclera clear, no icterus.   Conjunctiva pink. Ears:  Normal auditory acuity. Nose:  No deformity, discharge,  or lesions. Msk:  Symmetrical without gross deformities. Normal posture. Extremities:  Without clubbing or edema. Neurologic:  Alert and  oriented x4;  grossly normal neurologically. Skin:  Intact without significant lesions or rashes. Psych:  Alert and cooperative. Normal  mood and affect.  Impression/Plan: Daniel Schaefer is here for a colonoscopy to be performed for colon cancer screening purposes.  The risks of the procedure including infection, bleed, or perforation as well as benefits, limitations, alternatives and imponderables have been reviewed with the patient. Questions have been answered. All parties agreeable.

## 2023-12-02 NOTE — Discharge Instructions (Signed)

## 2023-12-02 NOTE — Transfer of Care (Signed)
 Immediate Anesthesia Transfer of Care Note  Patient: Daniel Schaefer  Procedure(s) Performed: COLONOSCOPY WITH PROPOFOL POLYPECTOMY CONTROL OF HEMORRHAGE, GI TRACT, ENDOSCOPIC, BY CLIPPING OR OVERSEWING  Patient Location: Short Stay  Anesthesia Type:General  Level of Consciousness: sedated  Airway & Oxygen Therapy: Patient Spontanous Breathing  Post-op Assessment: Report given to RN  Post vital signs: Reviewed and stable  Last Vitals:  Vitals Value Taken Time  BP    Temp    Pulse    Resp    SpO2      Last Pain:  Vitals:   12/02/23 0745  TempSrc:   PainSc: 0-No pain         Complications: No notable events documented.

## 2023-12-05 ENCOUNTER — Encounter (HOSPITAL_COMMUNITY): Payer: Self-pay | Admitting: Gastroenterology

## 2023-12-05 LAB — SURGICAL PATHOLOGY

## 2023-12-07 NOTE — Progress Notes (Signed)
 I reviewed the pathology results. Ann, can you send her a letter with the findings as described below please?  Repeat colonoscopy in 5 years  Thanks,  Vista Lawman, MD Gastroenterology and Hepatology [facility]  ---------------------------------------------------------------------------------------------  [facility] [address], [place], [place] [address] Phone:  [phone]   12/07/23 [place], [place]   Dear Cecilie Kicks,  I am writing to inform you that the biopsies taken during your recent endoscopic examination showed:  FINAL MICROSCOPIC DIAGNOSIS:   A. TRANSVERSE COLON, POLYPECTOMY:  Benign ulcerated inflammatory/granulation tissue polyp  Negative for dysplasia and carcinoma   B. SIGMOID COLON, POLYPECTOMY:  Tubular adenoma  Negative for high grade dysplasia and carcinoma    What does this mean?  I am writing to let you know the results of your recent colonoscopy.  You had a total of 2 polyps removed. The pathology came back as "tubular adenoma and inflammatory polyp." These findings are NOT cancer, but had the tubular adenoma polyp remained in your colon, they could have turned into cancer.  Given these findings, it is recommended that your next colonoscopy be performed in 5 years.  As per [place]  Multi-Society Task Force on Colorectal Cancer recommendation; For individuals ages 39 to 49, the decision to start or continue screening should be individualized.   Also I value your feedback , so if you get a survey , please take the time to fill it out and thank you for choosing [facility]  Please call us at [phone] if you have persistent problems or have questions about your condition that have not been fully answered at this time.  Sincerely,  Vista Lawman, MD Gastroenterology and  Hepatology

## 2023-12-08 ENCOUNTER — Encounter (INDEPENDENT_AMBULATORY_CARE_PROVIDER_SITE_OTHER): Payer: Self-pay | Admitting: *Deleted

## 2023-12-13 ENCOUNTER — Ambulatory Visit: Attending: Cardiology | Admitting: *Deleted

## 2023-12-13 DIAGNOSIS — I4821 Permanent atrial fibrillation: Secondary | ICD-10-CM | POA: Diagnosis not present

## 2023-12-13 DIAGNOSIS — Z5181 Encounter for therapeutic drug level monitoring: Secondary | ICD-10-CM

## 2023-12-13 LAB — POCT INR: INR: 2 (ref 2.0–3.0)

## 2023-12-13 NOTE — Patient Instructions (Signed)
 Continue warfarin 1 tablet daily except 1/2 tablet on Wednesdays Recheck in 4 wks

## 2024-01-05 DIAGNOSIS — E1122 Type 2 diabetes mellitus with diabetic chronic kidney disease: Secondary | ICD-10-CM | POA: Diagnosis not present

## 2024-01-05 DIAGNOSIS — I10 Essential (primary) hypertension: Secondary | ICD-10-CM | POA: Diagnosis not present

## 2024-01-09 DIAGNOSIS — E1122 Type 2 diabetes mellitus with diabetic chronic kidney disease: Secondary | ICD-10-CM | POA: Diagnosis not present

## 2024-01-09 DIAGNOSIS — K219 Gastro-esophageal reflux disease without esophagitis: Secondary | ICD-10-CM | POA: Diagnosis not present

## 2024-01-09 DIAGNOSIS — J309 Allergic rhinitis, unspecified: Secondary | ICD-10-CM | POA: Diagnosis not present

## 2024-01-09 DIAGNOSIS — I10 Essential (primary) hypertension: Secondary | ICD-10-CM | POA: Diagnosis not present

## 2024-01-09 DIAGNOSIS — I5032 Chronic diastolic (congestive) heart failure: Secondary | ICD-10-CM | POA: Diagnosis not present

## 2024-01-09 DIAGNOSIS — N182 Chronic kidney disease, stage 2 (mild): Secondary | ICD-10-CM | POA: Diagnosis not present

## 2024-01-09 DIAGNOSIS — I482 Chronic atrial fibrillation, unspecified: Secondary | ICD-10-CM | POA: Diagnosis not present

## 2024-01-10 ENCOUNTER — Ambulatory Visit: Attending: Cardiology | Admitting: *Deleted

## 2024-01-10 DIAGNOSIS — I4821 Permanent atrial fibrillation: Secondary | ICD-10-CM | POA: Diagnosis not present

## 2024-01-10 DIAGNOSIS — Z5181 Encounter for therapeutic drug level monitoring: Secondary | ICD-10-CM

## 2024-01-10 LAB — POCT INR: INR: 2.3 (ref 2.0–3.0)

## 2024-01-10 NOTE — Patient Instructions (Signed)
Continue warfarin 1 tablet daily except 1/2 tablet on Wednesdays Recheck in 6 wks

## 2024-01-17 ENCOUNTER — Other Ambulatory Visit: Payer: Self-pay | Admitting: "Endocrinology

## 2024-01-17 DIAGNOSIS — Z794 Long term (current) use of insulin: Secondary | ICD-10-CM

## 2024-01-26 ENCOUNTER — Other Ambulatory Visit: Payer: Self-pay | Admitting: Internal Medicine

## 2024-02-02 DIAGNOSIS — E1122 Type 2 diabetes mellitus with diabetic chronic kidney disease: Secondary | ICD-10-CM | POA: Diagnosis not present

## 2024-02-02 DIAGNOSIS — I10 Essential (primary) hypertension: Secondary | ICD-10-CM | POA: Diagnosis not present

## 2024-02-07 ENCOUNTER — Other Ambulatory Visit: Payer: Self-pay | Admitting: "Endocrinology

## 2024-02-15 ENCOUNTER — Other Ambulatory Visit: Payer: Self-pay | Admitting: "Endocrinology

## 2024-02-15 ENCOUNTER — Other Ambulatory Visit: Payer: Self-pay | Admitting: Cardiology

## 2024-02-15 DIAGNOSIS — Z794 Long term (current) use of insulin: Secondary | ICD-10-CM

## 2024-02-20 ENCOUNTER — Other Ambulatory Visit: Payer: Self-pay | Admitting: Nurse Practitioner

## 2024-02-20 ENCOUNTER — Other Ambulatory Visit: Payer: Self-pay | Admitting: *Deleted

## 2024-02-20 MED ORDER — TOUJEO MAX SOLOSTAR 300 UNIT/ML ~~LOC~~ SOPN: 50.0000 [IU] | PEN_INJECTOR | Freq: Every day | SUBCUTANEOUS | 1 refills | Status: DC

## 2024-02-21 ENCOUNTER — Ambulatory Visit: Attending: Cardiology | Admitting: *Deleted

## 2024-02-21 DIAGNOSIS — I4821 Permanent atrial fibrillation: Secondary | ICD-10-CM | POA: Diagnosis not present

## 2024-02-21 DIAGNOSIS — Z5181 Encounter for therapeutic drug level monitoring: Secondary | ICD-10-CM

## 2024-02-21 LAB — POCT INR: INR: 2.1 (ref 2.0–3.0)

## 2024-02-21 NOTE — Patient Instructions (Signed)
Continue warfarin 1 tablet daily except 1/2 tablet on Wednesdays Recheck in 6 wks

## 2024-03-07 DIAGNOSIS — I10 Essential (primary) hypertension: Secondary | ICD-10-CM | POA: Diagnosis not present

## 2024-03-07 DIAGNOSIS — E1122 Type 2 diabetes mellitus with diabetic chronic kidney disease: Secondary | ICD-10-CM | POA: Diagnosis not present

## 2024-03-20 DIAGNOSIS — Z794 Long term (current) use of insulin: Secondary | ICD-10-CM | POA: Diagnosis not present

## 2024-03-20 DIAGNOSIS — E1159 Type 2 diabetes mellitus with other circulatory complications: Secondary | ICD-10-CM | POA: Diagnosis not present

## 2024-03-21 ENCOUNTER — Other Ambulatory Visit: Payer: Self-pay | Admitting: Cardiology

## 2024-03-21 LAB — COMPREHENSIVE METABOLIC PANEL WITH GFR
ALT: 11 IU/L (ref 0–44)
AST: 16 IU/L (ref 0–40)
Albumin: 4.3 g/dL (ref 3.8–4.8)
Alkaline Phosphatase: 69 IU/L (ref 44–121)
BUN/Creatinine Ratio: 15 (ref 10–24)
BUN: 19 mg/dL (ref 8–27)
Bilirubin Total: 0.3 mg/dL (ref 0.0–1.2)
CO2: 23 mmol/L (ref 20–29)
Calcium: 9 mg/dL (ref 8.6–10.2)
Chloride: 103 mmol/L (ref 96–106)
Creatinine, Ser: 1.3 mg/dL — ABNORMAL HIGH (ref 0.76–1.27)
Globulin, Total: 2.3 g/dL (ref 1.5–4.5)
Glucose: 174 mg/dL — ABNORMAL HIGH (ref 70–99)
Potassium: 4.7 mmol/L (ref 3.5–5.2)
Sodium: 142 mmol/L (ref 134–144)
Total Protein: 6.6 g/dL (ref 6.0–8.5)
eGFR: 57 mL/min/1.73 — ABNORMAL LOW (ref >59)

## 2024-03-23 ENCOUNTER — Other Ambulatory Visit: Payer: Medicare Other

## 2024-03-27 ENCOUNTER — Encounter: Payer: Self-pay | Admitting: "Endocrinology

## 2024-03-27 ENCOUNTER — Ambulatory Visit: Admitting: "Endocrinology

## 2024-03-27 VITALS — BP 136/74 | HR 56 | Ht 70.0 in | Wt 212.6 lb

## 2024-03-27 DIAGNOSIS — E1159 Type 2 diabetes mellitus with other circulatory complications: Secondary | ICD-10-CM

## 2024-03-27 DIAGNOSIS — Z794 Long term (current) use of insulin: Secondary | ICD-10-CM

## 2024-03-27 DIAGNOSIS — E782 Mixed hyperlipidemia: Secondary | ICD-10-CM | POA: Diagnosis not present

## 2024-03-27 DIAGNOSIS — I10 Essential (primary) hypertension: Secondary | ICD-10-CM

## 2024-03-27 LAB — POCT GLYCOSYLATED HEMOGLOBIN (HGB A1C)

## 2024-03-27 MED ORDER — TOUJEO MAX SOLOSTAR 300 UNIT/ML ~~LOC~~ SOPN: 52.0000 [IU] | PEN_INJECTOR | Freq: Every day | SUBCUTANEOUS | 1 refills | Status: DC

## 2024-03-27 MED ORDER — EMPAGLIFLOZIN 10 MG PO TABS: 10.0000 mg | ORAL_TABLET | Freq: Every day | ORAL | 1 refills | Status: DC

## 2024-03-27 NOTE — Patient Instructions (Signed)

## 2024-03-27 NOTE — Progress Notes (Signed)
 03/27/2024  Endocrinology follow-up note   Subjective:    Patient ID: Daniel Schaefer, male    DOB: 08-10-1951,    Past Medical History:  Diagnosis Date   Atrial fibrillation [facility])    Reportedly diagnosed November 2011   Essential hypertension    History of kidney stones    Prostate cancer [facility])    Radiation implants   Type 2 diabetes mellitus (HCC)    Past Surgical History:  Procedure Laterality Date   COLONOSCOPY WITH PROPOFOL  N/A 12/02/2023   Procedure: COLONOSCOPY WITH PROPOFOL ;  Surgeon: Cinderella Deatrice FALCON, MD;  Location: [facility];  Service: Endoscopy;  Laterality: N/A;  7:30am;asa 3   Gold seed placement     HEMOSTASIS CLIP PLACEMENT  12/02/2023   Procedure: CONTROL OF HEMORRHAGE, GI TRACT, ENDOSCOPIC, BY CLIPPING OR OVERSEWING;  Surgeon: Cinderella Deatrice FALCON, MD;  Location: [facility];  Service: Endoscopy;;   INGUINAL HERNIA REPAIR Left 07/02/2020   Procedure: LEFT INGUINAL HERNIORRHAPY WITH MESH;  Surgeon: Mavis Anes, MD;  Location: [facility] ORS;  Service: General;  Laterality: Left;   LITHOTRIPSY     POLYPECTOMY  12/02/2023   Procedure: POLYPECTOMY;  Surgeon: Cinderella Deatrice FALCON, MD;  Location: [facility];  Service: Endoscopy;;   PROSTATE BIOPSY     TRIGGER FINGER RELEASE Left    ring finger   URETEROSCOPY WITH HOLMIUM LASER LITHOTRIPSY     Social History   Socioeconomic History   Marital status: Divorced    Spouse name: Not on file   Number of children: Not on file   Years of education: Not on file   Highest education level: Not on file  Occupational History   Not on file  Tobacco Use   Smoking status: Never   Smokeless tobacco: Never  Vaping Use   Vaping status: Never Used  Substance and Sexual Activity   Alcohol use: No    Alcohol/week: 0.0 standard drinks of alcohol   Drug use: No   Sexual activity: Yes  Other Topics Concern   Not on file  Social History Narrative   Not on file   Social Drivers of Health   Financial Resource Strain: Not on  file  Food Insecurity: Not on file  Transportation Needs: Not on file  Physical Activity: Not on file  Stress: Not on file  Social Connections: Not on file   Outpatient Encounter Medications as of 03/27/2024  Medication Sig   empagliflozin  (JARDIANCE ) 10 MG TABS tablet Take 1 tablet (10 mg total) by mouth daily with breakfast.   [DISCONTINUED] insulin  glargine, 2 Unit Dial, (TOUJEO  MAX SOLOSTAR) 300 UNIT/ML Solostar Pen Inject 50 Units into the skin at bedtime. (Patient taking differently: Inject 56 Units into the skin at bedtime.)   albuterol (VENTOLIN HFA) 108 (90 Base) MCG/ACT inhaler Inhale into the lungs.   amLODipine  (NORVASC ) 5 MG tablet Take 1 tablet by mouth once daily   Azelastine HCl 137 MCG/SPRAY SOLN Place 2 sprays into both nostrils daily.   Blood Glucose Monitoring Suppl (ACCU-CHEK GUIDE ME) w/Device KIT 1 Piece by Does not apply route as directed.   Cholecalciferol (VITAMIN D3) 125 MCG (5000 UT) TABS Take 5,000 Units by mouth daily.    Flaxseed, Linseed, (FLAX SEEDS PO) Take by mouth.   furosemide  (LASIX ) 20 MG tablet Take 1 tablet (20 mg total) by mouth daily. Please call [phone] to schedule an appointment for future refills. Thank you.   GLOBAL EASE INJECT PEN NEEDLES 31G X 8 MM MISC USE  1 DAILY AS DIRECTED - RUN ON CASH $15 - PER PC.   glucose blood (ACCU-CHEK GUIDE TEST) test strip Use to check blood glucose twice daily as directed.   Green Tea, Camellia sinensis, (GREEN TEA EXTRACT PO) Take 500 mg by mouth daily.   insulin  glargine, 2 Unit Dial, (TOUJEO  MAX SOLOSTAR) 300 UNIT/ML Solostar Pen Inject 52 Units into the skin at bedtime.   Insulin  Pen Needle (B-D ULTRAFINE III SHORT PEN) 31G X 8 MM MISC 1 each by Does not apply route as directed.   Lancets MISC 1 each by Does not apply route as directed.   levocetirizine (XYZAL) 5 MG tablet SMARTSIG:1 Tablet(s) By Mouth Every Evening   melatonin 5 MG TABS Take 5 mg by mouth.   metFORMIN  (GLUCOPHAGE ) 500 MG tablet Take 1  tablet by mouth once daily with breakfast   olmesartan  (BENICAR ) 40 MG tablet Take 1 tablet (40 mg total) by mouth daily. REFILLS TO PCP   Omega-3 Fat Ac-Cholecalciferol (SUPERIOR OMEGA3 W/ VITAMIN D  PO) Take by mouth daily.   omeprazole (PRILOSEC) 20 MG capsule Take 20 mg by mouth daily.    oxymetazoline (AFRIN) 0.05 % nasal spray Place 1 spray into both nostrils 2 (two) times daily as needed for congestion.   potassium chloride  (KLOR-CON ) 10 MEQ tablet TAKE 1 TABLET BY MOUTH DAILY   pravastatin  (PRAVACHOL ) 40 MG tablet TAKE 1 TABLET BY MOUTH AT BEDTIME   Selenium 200 MCG CAPS Take 1 capsule by mouth daily.   tadalafil (CIALIS) 20 MG tablet Take 20 mg by mouth daily as needed for erectile dysfunction. (Patient not taking: Reported on 10/26/2023)   tamsulosin  (FLOMAX ) 0.4 MG CAPS capsule Take 1 capsule (0.4 mg total) by mouth daily.   warfarin (COUMADIN ) 5 MG tablet TAKE 1 TABLET BY MOUTH ONCE DAILY EXCEPT TAKE 1/2 TABLET ON WEDNESDAYS OR AS DIRECTED   Zinc 50 MG TABS Take by mouth.   No facility-administered encounter medications on file as of 03/27/2024.   ALLERGIES: Allergies  Allergen Reactions   Bactrim [Sulfamethoxazole-Trimethoprim] Other (See Comments)    Thin his blood really bad   Other     Cannot mix warfarin and bactrim    Tresiba  Flextouch [Insulin  Degludec] Rash   VACCINATION STATUS: Immunization History  Administered Date(s) Administered   Moderna Sars-Covid-2 Vaccination 10/04/2019, 11/05/2019    Diabetes He presents for his follow-up diabetic visit. He has type 2 diabetes mellitus. Onset time: He was diagnosed at approximate age of 13 years. His disease course has been fluctuating. There are no hypoglycemic associated symptoms. There are no diabetic associated symptoms. There are no hypoglycemic complications. Symptoms are worsening. Diabetic complications include nephropathy. Risk factors for coronary artery disease include hypertension, dyslipidemia, sedentary  lifestyle, male sex and diabetes mellitus. Current diabetic treatment includes oral agent (monotherapy) and insulin  injections. He is compliant with treatment most of the time. His weight is fluctuating minimally. He is following a generally unhealthy diet. When asked about meal planning, he reported none. He has not had a previous visit with a dietitian. He rarely participates in exercise. His home blood glucose trend is fluctuating minimally. His breakfast blood glucose range is generally 90-110 mg/dl. His bedtime blood glucose range is generally 130-140 mg/dl. His overall blood glucose range is 130-140 mg/dl. (He comes with his logs and his meter showing tight control of fasting glycemic profile, slightly above target postprandial readings.  His point-of-care A1c 7.1% unchanged. He has rare fasting hypoglycemic readings. ) An ACE inhibitor/angiotensin II receptor  blocker is being taken. He does not see a podiatrist.Eye exam is current.  Hypertension This is a chronic problem. The current episode started more than 1 year ago. The problem is unchanged. The problem is controlled. There are no associated agents to hypertension. Risk factors for coronary artery disease include diabetes mellitus, dyslipidemia, male gender and sedentary lifestyle. Past treatments include ACE inhibitors, calcium channel blockers, diuretics and beta blockers. The current treatment provides moderate improvement. There are no compliance problems.  Hypertensive end-organ damage includes kidney disease. Identifiable causes of hypertension include chronic renal disease.  Hyperlipidemia This is a chronic problem. The current episode started more than 1 year ago. The problem is controlled. Recent lipid tests were reviewed and are normal. Exacerbating diseases include chronic renal disease and diabetes. Factors aggravating his hyperlipidemia include beta blockers. Current antihyperlipidemic treatment includes statins. The current treatment  provides moderate improvement of lipids. There are no compliance problems.  Risk factors for coronary artery disease include dyslipidemia, diabetes mellitus, hypertension, male sex and a sedentary lifestyle.    Review of systems  Constitutional: + Minimally fluctuating body weight,  current Body mass index is 30.5 kg/m. , no fatigue, no subjective hyperthermia, no subjective hypothermia    Objective:    BP 136/74   Pulse (!) 56   Ht 5' 10 (1.778 m)   Wt 212 lb 9.6 oz (96.4 kg)   BMI 30.50 kg/m   Wt Readings from Last 3 Encounters:  03/27/24 212 lb 9.6 oz (96.4 kg)  12/02/23 213 lb 6.5 oz (96.8 kg)  11/29/23 213 lb 9.6 oz (96.9 kg)    BP Readings from Last 3 Encounters:  03/27/24 136/74  12/02/23 (!) 103/48  11/29/23 136/74     Physical Exam- Limited  Constitutional:  Body mass index is 30.5 kg/m. , not in acute distress, normal state of mind Eyes:  EOMI, no exophthalmos     Results for orders placed or performed in visit on 02/21/24  POCT INR   Collection Time: 02/21/24 10:33 AM  Result Value Ref Range   INR 2.1 2.0 - 3.0   POC INR     Complete Blood Count (Most recent): Lab Results  Component Value Date   WBC 10.2 09/27/2022   HGB 11.9 (L) 09/27/2022   HCT 38.5 (L) 09/27/2022   MCV 76.4 (L) 09/27/2022   PLT 269 09/27/2022   Chemistry (most recent): Lab Results  Component Value Date   NA 142 03/20/2024   K 4.7 03/20/2024   CL 103 03/20/2024   CO2 23 03/20/2024   BUN 19 03/20/2024   CREATININE 1.30 (H) 03/20/2024   Lipid Panel     Component Value Date/Time   CHOL 130 11/17/2023 0820   TRIG 121 11/17/2023 0820   HDL 43 11/17/2023 0820   CHOLHDL 3.0 11/17/2023 0820   LDLCALC 65 11/17/2023 0820    Assessment & Plan:   1) Type 2 diabetes mellitus with stage 3 renal insufficiency, with long-term current use of insulin  (HCC) He comes with his logs and his meter showing tight control of fasting glycemic profile, slightly above target postprandial  readings.  His point-of-care A1c 7.1% unchanged. He has rare fasting hypoglycemic readings.  -Patient remains at a high risk for more acute and chronic complications of diabetes which include CAD, CVA, CKD, retinopathy, and neuropathy. These are all discussed in detail with the patient.  -Recent labs reviewed showing improved renal function.  - Nutritional counseling repeated at each appointment due to patients tendency to  fall back in to old habits.   - he acknowledges that there is a room for improvement in his food and drink choices. - Suggestion is made for him to avoid simple carbohydrates  from his diet including Cakes, Sweet Desserts, Ice Cream, Soda (diet and regular), Sweet Tea, Candies, Chips, Cookies, Store Bought Juices, Alcohol , Artificial Sweeteners,  Coffee Creamer, and Sugar-free Products, Lemonade. This will help patient to have more stable blood glucose profile and potentially avoid unintended weight gain.   -He is following with Penny Crumpton, CDE for diabetes education.  - He presents with tightly controlled fasting glycemia.  I discussed and lowered his Toujeo  to 52 units nightly associated with monitoring of blood glucose twice a day-daily before breakfast and at bedtime.    - He is advised to call the clinic if he has readings less than 70 or greater than 150 mg per DL 3 days in a week at fasting.     He worries about cost of medications, previously declined offer for Trulicity/Ozempic weekly injections.   -She may benefit from SGLT2 inhibitors.  This time is open for prescription.  I discussed the prescribed Jardiance  10 mg p.o. daily with breakfast if he gets appropriate coverage by his insurance. He has sulfa allergy, not a candidate for glipizide.  His recent CMP showed CKD, and new development for him.  His metformin  was lowered to 500 mg p.o. daily at breakfast.   This patient would have benefited from a CGM.  He is still researching and will let me know if  you would like to go on a CGM.    - He is generally hesitant to add medications.  - Patient specific target  for A1c; LDL, HDL, Triglycerides, and  Waist Circumference were discussed in detail.  2) BP/HTN: His blood pressure is controlled he is advised to continue Norvasc  5 mg po daily, Lasix  20 mg po daily, Lisinopril  20 mg po daily, and Metoprolol  25 mg po twice daily.  3) Lipids/HPL:  His most recent lipid panel showed continued control of his LDL at 65.  He is advised to continue pravastatin  40 mg p.o. nightly.     Side effects and precautions discussed with him.  He recently had his labs drawn at his PCP, will request copy.  4)  Weight/Diet:  His Body mass index is 30.5 kg/m.-candidate for some weight loss.  He is following with Penny Crumpton, CD for diabetes education.  No success in weight loss,  exercise, and carbohydrates information provided.  5) Chronic Care/Health Maintenance: -Patient on ACEI and Statin medications and encouraged to continue to follow up with Ophthalmology, Podiatrist at least yearly or according to recommendations, and advised to stay away from smoking. I have recommended yearly flu vaccine and pneumonia vaccination at least every 5 years; moderate intensity exercise for up to 150 minutes weekly; and  sleep for at least 7 hours a day.   He is advised to maintain close follow-up with his PMD  for primary care needs.  I spent  25  minutes in the care of the patient today including review of labs from CMP, Lipids, Thyroid  Function, Hematology (current and previous including abstractions from other facilities); face-to-face time discussing  his blood glucose readings/logs, discussing hypoglycemia and hyperglycemia episodes and symptoms, medications doses, his options of short and long term treatment based on the latest standards of care / guidelines;  discussion about incorporating lifestyle medicine;  and documenting the encounter. Risk reduction counseling  performed per  USPSTF guidelines to reduce  obesity and cardiovascular risk factors.     Please refer to Patient Instructions for Blood Glucose Monitoring and Insulin /Medications Dosing Guide  in media tab for additional information. Please  also refer to  Patient Self Inventory in the Media  tab for reviewed elements of pertinent patient history.  Daniel Schaefer participated in the discussions, expressed understanding, and voiced agreement with the above plans.  All questions were answered to his satisfaction. he is encouraged to contact clinic should he have any questions or concerns prior to his return visit.   Follow up plan: Return in about 4 months (around 07/28/2024) for Bring Meter/CGM Device/Logs- A1c in Office, Urine MA - [place].  [place] Rio, [facility] [address] [place], [place] [address] Phone: [phone] Fax: [phone]  03/27/2024, 5:05 PM

## 2024-03-29 ENCOUNTER — Other Ambulatory Visit: Payer: Medicare Other

## 2024-03-29 ENCOUNTER — Other Ambulatory Visit: Payer: Self-pay | Admitting: Urology

## 2024-03-29 ENCOUNTER — Ambulatory Visit (HOSPITAL_COMMUNITY)
Admission: RE | Admit: 2024-03-29 | Discharge: 2024-03-29 | Disposition: A | Discharge status: Home / Self Care | Source: Ambulatory Visit | Attending: Urology | Admitting: Urology

## 2024-03-29 ENCOUNTER — Other Ambulatory Visit: Payer: Self-pay

## 2024-03-29 DIAGNOSIS — N200 Calculus of kidney: Secondary | ICD-10-CM

## 2024-03-29 DIAGNOSIS — Z8546 Personal history of malignant neoplasm of prostate: Secondary | ICD-10-CM

## 2024-03-30 ENCOUNTER — Ambulatory Visit: Payer: Self-pay

## 2024-03-30 LAB — PSA: Prostate Specific Ag, Serum: 0.3 ng/mL (ref 0.0–4.0)

## 2024-04-03 ENCOUNTER — Ambulatory Visit: Attending: Cardiology | Admitting: *Deleted

## 2024-04-03 DIAGNOSIS — Z5181 Encounter for therapeutic drug level monitoring: Secondary | ICD-10-CM | POA: Diagnosis not present

## 2024-04-03 DIAGNOSIS — I4821 Permanent atrial fibrillation: Secondary | ICD-10-CM | POA: Diagnosis not present

## 2024-04-03 LAB — POCT INR: INR: 1.9 — AB (ref 2.0–3.0)

## 2024-04-03 NOTE — Patient Instructions (Signed)
 Increase warfarin to 1 tablet daily  Recheck in 6 wks

## 2024-04-03 NOTE — Progress Notes (Signed)
Please see anticoagulation encounter.

## 2024-04-04 ENCOUNTER — Other Ambulatory Visit: Payer: Self-pay | Admitting: Urology

## 2024-04-04 DIAGNOSIS — Z8546 Personal history of malignant neoplasm of prostate: Secondary | ICD-10-CM | POA: Insufficient documentation

## 2024-04-04 DIAGNOSIS — E291 Testicular hypofunction: Secondary | ICD-10-CM | POA: Insufficient documentation

## 2024-04-04 DIAGNOSIS — N200 Calculus of kidney: Secondary | ICD-10-CM | POA: Insufficient documentation

## 2024-04-04 DIAGNOSIS — R3912 Poor urinary stream: Secondary | ICD-10-CM | POA: Insufficient documentation

## 2024-04-04 DIAGNOSIS — N5201 Erectile dysfunction due to arterial insufficiency: Secondary | ICD-10-CM | POA: Insufficient documentation

## 2024-04-04 DIAGNOSIS — N138 Other obstructive and reflux uropathy: Secondary | ICD-10-CM | POA: Insufficient documentation

## 2024-04-04 NOTE — Progress Notes (Unsigned)
 Name: Daniel Schaefer DOB: [birth year] MRN: [id number]  History of Present Illness: Mr. Mathieu is a 73 y.o. male who presents today for follow up visit at [facility]. Relevant History includes: 1. Prostate cancer.  - Treated with EXRT and brachytherapy seed implants in 2003.  2. Prostatomegaly with LUTS (nocturia, occasional urgency, rare urge incontinence, weak stream). 3. Kidney stones. 4. Hypogonadism. 5. Erectile dysfunction. Previously had Tadalafil.  At last visit with Dr. Watt on 03/24/2023: - He didn't go for the KUB ordered for prior to this visit for the 12mm LLP stone but he had a CT in 1/24 that showed a stable 12mm LLP stone and a 7mm RLP stone. He has a history of ED and has tadalafil but doesn't use it. He had a left inguinal hernia repair last year and he has had some penile retraction since. He has hypogonadism with the last testosterone  level at 165 in 7/23. He is on tamsulosin  for LUTS. - The plan was: 1. History of prostate cancer.  PSA is stable.  Return in a year.  2. BPH with BOO and mild LUTS.   I refilled the tamsulosin . 3. Testicular atrophy.  His T level is 165.  I discussed TRT but he would like to hold off. 4. ED.  He hasn't been using the tadalafil and hasn't been active.  5. Renal stone.  He has stable 12mm LLP stone or stone cluster and smaller RLP stone.  I will reassess with a KUB in 1 year.  Since last visit: > 03/29/2024:  - PSA stable (0.3). - KUB showed multiple bilateral intrarenal calculi. Largest stone on the left measures 18 mm. Largest stone on the right measures 3 mm.   Today: He reports nocturia x2-3. Denies urgency, dysuria, gross hematuria, weak urinary stream, hesitancy, straining to void, or sensations of incomplete emptying. Denies flank pain or abdominal pain. He is bothered by his urinary frequency, particularly when at church. He denies being sexually active.  Medications: Current Outpatient Medications   Medication Sig Dispense Refill   albuterol (VENTOLIN HFA) 108 (90 Base) MCG/ACT inhaler Inhale into the lungs.     amLODipine  (NORVASC ) 5 MG tablet Take 1 tablet by mouth once daily 90 tablet 1   Azelastine HCl 137 MCG/SPRAY SOLN Place 2 sprays into both nostrils daily.     Blood Glucose Monitoring Suppl (ACCU-CHEK GUIDE ME) w/Device KIT 1 Piece by Does not apply route as directed. 1 kit 0   Cholecalciferol (VITAMIN D3) 125 MCG (5000 UT) TABS Take 5,000 Units by mouth daily.      empagliflozin  (JARDIANCE ) 10 MG TABS tablet Take 1 tablet (10 mg total) by mouth daily with breakfast. 90 tablet 1   Flaxseed, Linseed, (FLAX SEEDS PO) Take by mouth.     furosemide  (LASIX ) 20 MG tablet Take 1 tablet (20 mg total) by mouth daily. Please call [phone] to schedule an appointment for future refills. Thank you. 90 tablet 1   GLOBAL EASE INJECT PEN NEEDLES 31G X 8 MM MISC USE 1 DAILY AS DIRECTED - RUN ON CASH $15 - PER PC. 100 each 5   glucose blood (ACCU-CHEK GUIDE TEST) test strip Use to check blood glucose twice daily as directed. 100 each 2   Green Tea, Camellia sinensis, (GREEN TEA EXTRACT PO) Take 500 mg by mouth daily.     insulin  glargine, 2 Unit Dial, (TOUJEO  MAX SOLOSTAR) 300 UNIT/ML Solostar Pen Inject 52 Units into the skin at bedtime. 15 mL 1  Insulin  Pen Needle (B-D ULTRAFINE III SHORT PEN) 31G X 8 MM MISC 1 each by Does not apply route as directed. 50 each 2   Lancets MISC 1 each by Does not apply route as directed. 100 each 3   levocetirizine (XYZAL) 5 MG tablet SMARTSIG:1 Tablet(s) By Mouth Every Evening     melatonin 5 MG TABS Take 5 mg by mouth.     metFORMIN  (GLUCOPHAGE ) 500 MG tablet Take 1 tablet by mouth once daily with breakfast 90 tablet 0   olmesartan  (BENICAR ) 40 MG tablet Take 1 tablet (40 mg total) by mouth daily. REFILLS TO PCP 30 tablet 0   Omega-3 Fat Ac-Cholecalciferol (SUPERIOR OMEGA3 W/ VITAMIN D  PO) Take by mouth daily.     omeprazole (PRILOSEC) 20 MG capsule Take 20  mg by mouth daily.      oxymetazoline (AFRIN) 0.05 % nasal spray Place 1 spray into both nostrils 2 (two) times daily as needed for congestion.     potassium chloride  (KLOR-CON ) 10 MEQ tablet TAKE 1 TABLET BY MOUTH DAILY 100 tablet 2   pravastatin  (PRAVACHOL ) 40 MG tablet TAKE 1 TABLET BY MOUTH AT BEDTIME 90 tablet 1   Selenium 200 MCG CAPS Take 1 capsule by mouth daily.     tamsulosin  (FLOMAX ) 0.4 MG CAPS capsule Take 1 capsule by mouth once daily 90 capsule 0   warfarin (COUMADIN ) 5 MG tablet TAKE 1 TABLET BY MOUTH ONCE DAILY EXCEPT TAKE 1/2 TABLET ON WEDNESDAYS OR AS DIRECTED 35 tablet 5   Zinc 50 MG TABS Take by mouth.     No current facility-administered medications for this visit.    Allergies: Allergies  Allergen Reactions   Bactrim [Sulfamethoxazole-Trimethoprim] Other (See Comments)    Thin his blood really bad   Other     Cannot mix warfarin and bactrim    Tresiba  Flextouch [Insulin  Degludec] Rash    Past Medical History:  Diagnosis Date   Atrial fibrillation [facility])    Reportedly diagnosed November 2011   Essential hypertension    History of kidney stones    Prostate cancer [facility])    Radiation implants   Type 2 diabetes mellitus (HCC)    Past Surgical History:  Procedure Laterality Date   COLONOSCOPY WITH PROPOFOL  N/A 12/02/2023   Procedure: COLONOSCOPY WITH PROPOFOL ;  Surgeon: Cinderella Deatrice FALCON, MD;  Location: [facility];  Service: Endoscopy;  Laterality: N/A;  7:30am;asa 3   Gold seed placement     HEMOSTASIS CLIP PLACEMENT  12/02/2023   Procedure: CONTROL OF HEMORRHAGE, GI TRACT, ENDOSCOPIC, BY CLIPPING OR OVERSEWING;  Surgeon: Cinderella Deatrice FALCON, MD;  Location: [facility];  Service: Endoscopy;;   INGUINAL HERNIA REPAIR Left 07/02/2020   Procedure: LEFT INGUINAL HERNIORRHAPY WITH MESH;  Surgeon: Mavis Anes, MD;  Location: [facility] OR[facility];  Service: General;  Laterality: Left;   LITHOTRIPSY     POLYPECTOMY  12/02/2023   Procedure: POLYPECTOMY;  Surgeon: Cinderella Deatrice FALCON, MD;  Location: [facility];  Service: Endoscopy;;   PROSTATE BIOPSY     TRIGGER FINGER RELEASE Left    ring finger   URETEROSCOPY WITH HOLMIUM LASER LITHOTRIPSY     Family History  Problem Relation Age of Onset   Cancer Father 41       Laryngeal   Lymphoma Mother 10   Social History   Socioeconomic History   Marital status: Divorced    Spouse name: Not on file   Number of children: Not on file  Years of education: Not on file   Highest education level: Not on file  Occupational History   Not on file  Tobacco Use   Smoking status: Never   Smokeless tobacco: Never  Vaping Use   Vaping status: Never Used  Substance and Sexual Activity   Alcohol use: No    Alcohol/week: 0.0 standard drinks of alcohol   Drug use: No   Sexual activity: Yes  Other Topics Concern   Not on file  Social History Narrative   Not on file   Social Drivers of Health   Financial Resource Strain: Not on file  Food Insecurity: Not on file  Transportation Needs: Not on file  Physical Activity: Not on file  Stress: Not on file  Social Connections: Not on file  Intimate Partner Violence: Not on file    SUBJECTIVE  Review of Systems Constitutional: Patient denies any unintentional weight loss or change in strength lntegumentary: Patient denies any rashes or pruritus Cardiovascular: Patient denies chest pain or syncope Respiratory: Patient denies shortness of breath Gastrointestinal: Patient denies nausea, vomiting, constipation, or diarrhea  Musculoskeletal: Patient denies muscle cramps or weakness Neurologic: Patient denies convulsions or seizures Allergic/Immunologic: Patient denies recent allergic reaction(s) Hematologic/Lymphatic: Patient denies bleeding tendencies Endocrine: Patient denies heat/cold intolerance  GU: As per HPI.  OBJECTIVE Vitals:   04/05/24 1343  BP: (!) 163/93  Pulse: 76   There is no height or weight on file to calculate BMI.  Physical  Examination Constitutional: No obvious distress; patient is non-toxic appearing  Cardiovascular: No visible lower extremity edema.  Respiratory: The patient does not have audible wheezing/stridor; respirations do not appear labored  Gastrointestinal: Abdomen non-distended Musculoskeletal: Normal ROM of UEs  Skin: No obvious rashes/open sores  Neurologic: CN 2-12 grossly intact Psychiatric: Answered questions appropriately with normal affect  Hematologic/Lymphatic/Immunologic: No obvious bruises or sites of spontaneous bleeding  UA: glucosuria (secondary to Jardiance  use), otherwise unremarkable PVR: 240 ml (patient stated he hadn't emptied completely when he voided; rushed)  ASSESSMENT History of adenocarcinoma of prostate - Plan: Urinalysis, Routine w reflex microscopic  BPH with urinary obstruction - Plan: BLADDER SCAN AMB NON-IMAGING  Renal stones  Hypogonadism in male  Erectile dysfunction due to arterial insufficiency  Weak urinary stream  For bothersome urinary frequency we discussed option for him to shop online or at medical supply store(s) for external male catheter as desired (no prescription needed); showed him some examples online. He verbalized understanding that his urinary frequency exacerbated by his Lasix  and Jardiance  use.   We reviewed recent imaging results, in particular that his left stone burden has increased compared to prior and that proactive intervention is advised to prevent risk for developing hydronephrosis, kidney damage, etc. We discussed the various treatment options including extracorporeal shock wave lithotripsy (ESWL) and ureteroscopic stone manipulation (URS). We discussed possible risks and benefits of intervention including but not limited to: including pain, infection, sepsis, UTI, ureter perforation, need for stenting, post-op ureteral stricture, hematuria, possible need for follow up procedures.  Patient verbalized preference for ureteroscopic  stone manipulation due to outpatient procedure copay and lower risk for needing a secondary procedure; will consult with Dr. Sherrilee and notify patient of his surgical recommendation.  For LUTS he plans to continue Flomax  (Tamsulosin ) 0.4 mg daily; refills are up to date.  Patient verbalized understanding of and agreement with current plan. All questions were answered.  PLAN Advised the following: Continue Flomax  (Tamsulosin ) 0.4 mg daily.  Patient to consider OTC options for external  male catheter. Surgical consult sent to Dr. Sherrilee; Return for follow up to be determined per MD recommendation.  Addendum: Dr. Sherrilee agreed with plan for left ureteroscopic stone manipulation; patient will be notified. Surgery request submitted.   Orders Placed This Encounter  Procedures   Urinalysis, Routine w reflex microscopic   BLADDER SCAN AMB NON-IMAGING   Total time spent caring for the patient today was over 40 minutes. This includes time spent on the date of the visit reviewing the patient's chart before the visit, time spent during the visit, and time spent after the visit on documentation. Over 50% of that time was spent in face-to-face time with this patient for direct counseling. E&M based on time and complexity of medical decision making.  It has been explained that the patient is to follow regularly with their PCP in addition to all other providers involved in their care and to follow instructions provided by these respective offices. Patient advised to contact urology clinic if any urologic-pertaining questions, concerns, new symptoms or problems arise in the interim period.  There are no Patient Instructions on file for this visit.  Electronically signed by:  Lauraine KYM Oz, MSN, FNP-C, CUNP 04/05/2024 2:27 PM

## 2024-04-05 ENCOUNTER — Telehealth: Payer: Self-pay

## 2024-04-05 ENCOUNTER — Ambulatory Visit: Admitting: Urology

## 2024-04-05 ENCOUNTER — Ambulatory Visit: Payer: Medicare Other | Admitting: Urology

## 2024-04-05 ENCOUNTER — Encounter: Payer: Self-pay | Admitting: Urology

## 2024-04-05 ENCOUNTER — Other Ambulatory Visit: Payer: Self-pay | Admitting: Urology

## 2024-04-05 VITALS — BP 163/93 | HR 76

## 2024-04-05 DIAGNOSIS — E291 Testicular hypofunction: Secondary | ICD-10-CM | POA: Diagnosis not present

## 2024-04-05 DIAGNOSIS — N138 Other obstructive and reflux uropathy: Secondary | ICD-10-CM

## 2024-04-05 DIAGNOSIS — N401 Enlarged prostate with lower urinary tract symptoms: Secondary | ICD-10-CM

## 2024-04-05 DIAGNOSIS — N5201 Erectile dysfunction due to arterial insufficiency: Secondary | ICD-10-CM

## 2024-04-05 DIAGNOSIS — N200 Calculus of kidney: Secondary | ICD-10-CM

## 2024-04-05 DIAGNOSIS — R3912 Poor urinary stream: Secondary | ICD-10-CM | POA: Diagnosis not present

## 2024-04-05 DIAGNOSIS — Z8546 Personal history of malignant neoplasm of prostate: Secondary | ICD-10-CM | POA: Diagnosis not present

## 2024-04-05 LAB — BLADDER SCAN AMB NON-IMAGING: Scan Result: 240

## 2024-04-05 LAB — URINALYSIS, ROUTINE W REFLEX MICROSCOPIC
Bilirubin, UA: NEGATIVE
Ketones, UA: NEGATIVE
Leukocytes,UA: NEGATIVE
Nitrite, UA: NEGATIVE
Protein,UA: NEGATIVE
RBC, UA: NEGATIVE
Specific Gravity, UA: <1.005 — ABNORMAL LOW (ref 1.005–1.030)
Urobilinogen, Ur: 0.2 mg/dL (ref 0.2–1.0)
pH, UA: 6 (ref 5.0–7.5)

## 2024-04-05 NOTE — Telephone Encounter (Signed)
 Pt is made aware via detail voice message of Lauraine, NP response.

## 2024-04-05 NOTE — Telephone Encounter (Signed)
-----   Message from Lauraine JAYSON Oz sent at 04/05/2024  2:55 PM EDT ----- Please let patient know that Dr. Sherrilee responded to the consult note I sent today and agrees with plan to proceed with left ureteroscopic stone manipulation procedure as discussed. Surgery request submitted.

## 2024-04-06 ENCOUNTER — Other Ambulatory Visit: Payer: Self-pay

## 2024-04-06 DIAGNOSIS — N200 Calculus of kidney: Secondary | ICD-10-CM

## 2024-04-09 DIAGNOSIS — I10 Essential (primary) hypertension: Secondary | ICD-10-CM | POA: Diagnosis not present

## 2024-04-09 DIAGNOSIS — I482 Chronic atrial fibrillation, unspecified: Secondary | ICD-10-CM | POA: Diagnosis not present

## 2024-04-09 DIAGNOSIS — E1122 Type 2 diabetes mellitus with diabetic chronic kidney disease: Secondary | ICD-10-CM | POA: Diagnosis not present

## 2024-04-09 DIAGNOSIS — N182 Chronic kidney disease, stage 2 (mild): Secondary | ICD-10-CM | POA: Diagnosis not present

## 2024-04-09 DIAGNOSIS — J309 Allergic rhinitis, unspecified: Secondary | ICD-10-CM | POA: Diagnosis not present

## 2024-04-09 DIAGNOSIS — I5032 Chronic diastolic (congestive) heart failure: Secondary | ICD-10-CM | POA: Diagnosis not present

## 2024-04-09 DIAGNOSIS — Z0000 Encounter for general adult medical examination without abnormal findings: Secondary | ICD-10-CM | POA: Diagnosis not present

## 2024-04-09 DIAGNOSIS — K219 Gastro-esophageal reflux disease without esophagitis: Secondary | ICD-10-CM | POA: Diagnosis not present

## 2024-04-11 DIAGNOSIS — E1122 Type 2 diabetes mellitus with diabetic chronic kidney disease: Secondary | ICD-10-CM | POA: Diagnosis not present

## 2024-04-11 DIAGNOSIS — I10 Essential (primary) hypertension: Secondary | ICD-10-CM | POA: Diagnosis not present

## 2024-04-12 ENCOUNTER — Telehealth: Payer: Self-pay | Admitting: *Deleted

## 2024-04-12 NOTE — Telephone Encounter (Signed)
   Pre-operative Risk Assessment    Patient Name: Daniel Schaefer  DOB: [birth year] MRN: [id number]   Date of last office visit: 09/01/23 DR. MCDOWELL Date of next office visit: 05/29/24 DR. MCDOWELL   Request for Surgical Clearance    Procedure:  CYSTOSCOPY, URETEROSCOPY WITH STENT PLACEMENT  Date of Surgery:  Clearance 05/10/24                                Surgeon:  DR. SHERRILEE Surgeon's Group or Practice Name:  [facility] [place] Phone number:  [phone] Fax number:  [phone]   Type of Clearance Requested:   - Medical  - Pharmacy:  Hold Warfarin (Coumadin ) x 5 DAYS PRIOR   Type of Anesthesia:  General    Additional requests/questions:    [name]   04/12/2024, 4:31 PM

## 2024-04-13 DIAGNOSIS — E119 Type 2 diabetes mellitus without complications: Secondary | ICD-10-CM | POA: Diagnosis not present

## 2024-04-13 DIAGNOSIS — H40033 Anatomical narrow angle, bilateral: Secondary | ICD-10-CM | POA: Diagnosis not present

## 2024-04-16 ENCOUNTER — Telehealth: Payer: Self-pay | Admitting: *Deleted

## 2024-04-16 DIAGNOSIS — E1151 Type 2 diabetes mellitus with diabetic peripheral angiopathy without gangrene: Secondary | ICD-10-CM | POA: Insufficient documentation

## 2024-04-16 DIAGNOSIS — D508 Other iron deficiency anemias: Secondary | ICD-10-CM | POA: Insufficient documentation

## 2024-04-16 NOTE — Telephone Encounter (Signed)
   Name: Daniel Schaefer  DOB: [birth year]  MRN: [id number]  Primary Cardiologist: Jayson Sierras, MD   Preoperative team, please contact this patient and set up a phone call appointment for further preoperative risk assessment. Please obtain consent and complete medication review. Thank you for your help.  I confirm that guidance regarding antiplatelet and oral anticoagulation therapy has been completed and, if necessary, noted below.  Patient has not had an Afib/aflutter ablation within the last 3 months or DCCV within the last 30 days   Per office protocol, patient can hold warfarin for 5 days prior to procedure.     Patient will not need bridging with Lovenox (enoxaparin) around procedure.  I also confirmed the patient resides in the state of [place] . As per [facility] Medical Board telemedicine laws, the patient must reside in the state in which the provider is licensed.   Josefa CHRISTELLA Beauvais, NP 04/16/2024, 2:04 PM [facility]

## 2024-04-16 NOTE — Telephone Encounter (Signed)
 Pt has been scheduled for a tele visit, 04/18/24 9:20.  Consent on file.  Pt will have to call back and do his medications as he wasn't home when I called.     Patient Consent for Virtual Visit        Daniel Schaefer has provided verbal consent on 04/16/2024 for a virtual visit (video or telephone).   CONSENT FOR VIRTUAL VISIT FOR:  Daniel Schaefer  By participating in this virtual visit I agree to the following:  I hereby voluntarily request, consent and authorize [facility] and its employed or contracted physicians, physician assistants, nurse practitioners or other licensed health care professionals (the Practitioner), to provide me with telemedicine health care services (the "Services) as deemed necessary by the treating Practitioner. I acknowledge and consent to receive the Services by the Practitioner via telemedicine. I understand that the telemedicine visit will involve communicating with the Practitioner through live audiovisual communication technology and the disclosure of certain medical information by electronic transmission. I acknowledge that I have been given the opportunity to request an in-person assessment or other available alternative prior to the telemedicine visit and am voluntarily participating in the telemedicine visit.  I understand that I have the right to withhold or withdraw my consent to the use of telemedicine in the course of my care at any time, without affecting my right to future care or treatment, and that the Practitioner or I may terminate the telemedicine visit at any time. I understand that I have the right to inspect all information obtained and/or recorded in the course of the telemedicine visit and may receive copies of available information for a reasonable fee.  I understand that some of the potential risks of receiving the Services via telemedicine include:  Delay or interruption in medical evaluation due to technological equipment failure  or disruption; Information transmitted may not be sufficient (e.g. poor resolution of images) to allow for appropriate medical decision making by the Practitioner; and/or  In rare instances, security protocols could fail, causing a breach of personal health information.  Furthermore, I acknowledge that it is my responsibility to provide information about my medical history, conditions and care that is complete and accurate to the best of my ability. I acknowledge that Practitioner's advice, recommendations, and/or decision may be based on factors not within their control, such as incomplete or inaccurate data provided by me or distortions of diagnostic images or specimens that may result from electronic transmissions. I understand that the practice of medicine is not an exact science and that Practitioner makes no warranties or guarantees regarding treatment outcomes. I acknowledge that a copy of this consent can be made available to me via my patient portal [facility] MyChart), or I can request a printed copy by calling the office of [facility].    I understand that my insurance will be billed for this visit.   I have read or had this consent read to me. I understand the contents of this consent, which adequately explains the benefits and risks of the Services being provided via telemedicine.  I have been provided ample opportunity to ask questions regarding this consent and the Services and have had my questions answered to my satisfaction. I give my informed consent for the services to be provided through the use of telemedicine in my medical care

## 2024-04-16 NOTE — Telephone Encounter (Signed)
 Patient with diagnosis of A Fib on warfarin for anticoagulation.    Procedure: CYSTOSCOPY, URETEROSCOPY WITH STENT PLACEMENT  Date of procedure: 05/10/24   CHA2DS2-VASc Score = 5  This indicates a 7.2% annual risk of stroke. The patient's score is based upon: CHF History: 1 HTN History: 1 Diabetes History: 1 Stroke History: 0 Vascular Disease History: 0 Age Score: 2 Gender Score: 0     CrCl 67 ml/min Platelet count overdue  Patient has not had an Afib/aflutter ablation within the last 3 months or DCCV within the last 30 days  Per office protocol, patient can hold warfarin for 5 days prior to procedure.    Patient will not need bridging with Lovenox (enoxaparin) around procedure.  **This guidance is not considered finalized until pre-operative APP has relayed final recommendations.**

## 2024-04-16 NOTE — Telephone Encounter (Signed)
 Pt returning call

## 2024-04-16 NOTE — Telephone Encounter (Signed)
 Pt has been scheduled for a tele visit, 04/18/24 9:20.  Consent on file.  Pt will have to call back and do his medications as he wasn't home when I called.     Patient Consent for Virtual Visit        Daniel Schaefer has provided verbal consent on 04/16/2024 for a virtual visit (video or telephone).   CONSENT FOR VIRTUAL VISIT FOR:  Daniel Schaefer  By participating in this virtual visit I agree to the following:  I hereby voluntarily request, consent and authorize [facility] and its employed or contracted physicians, physician assistants, nurse practitioners or other licensed health care professionals (the Practitioner), to provide me with telemedicine health care services (the "Services) as deemed necessary by the treating Practitioner. I acknowledge and consent to receive the Services by the Practitioner via telemedicine. I understand that the telemedicine visit will involve communicating with the Practitioner through live audiovisual communication technology and the disclosure of certain medical information by electronic transmission. I acknowledge that I have been given the opportunity to request an in-person assessment or other available alternative prior to the telemedicine visit and am voluntarily participating in the telemedicine visit.  I understand that I have the right to withhold or withdraw my consent to the use of telemedicine in the course of my care at any time, without affecting my right to future care or treatment, and that the Practitioner or I may terminate the telemedicine visit at any time. I understand that I have the right to inspect all information obtained and/or recorded in the course of the telemedicine visit and may receive copies of available information for a reasonable fee.  I understand that some of the potential risks of receiving the Services via telemedicine include:  Delay or interruption in medical evaluation due to technological equipment failure  or disruption; Information transmitted may not be sufficient (e.g. poor resolution of images) to allow for appropriate medical decision making by the Practitioner; and/or  In rare instances, security protocols could fail, causing a breach of personal health information.  Furthermore, I acknowledge that it is my responsibility to provide information about my medical history, conditions and care that is complete and accurate to the best of my ability. I acknowledge that Practitioner's advice, recommendations, and/or decision may be based on factors not within their control, such as incomplete or inaccurate data provided by me or distortions of diagnostic images or specimens that may result from electronic transmissions. I understand that the practice of medicine is not an exact science and that Practitioner makes no warranties or guarantees regarding treatment outcomes. I acknowledge that a copy of this consent can be made available to me via my patient portal [facility] MyChart), or I can request a printed copy by calling the office of [facility].    I understand that my insurance will be billed for this visit.   I have read or had this consent read to me. I understand the contents of this consent, which adequately explains the benefits and risks of the Services being provided via telemedicine.  I have been provided ample opportunity to ask questions regarding this consent and the Services and have had my questions answered to my satisfaction. I give my informed consent for the services to be provided through the use of telemedicine in my medical care

## 2024-04-17 NOTE — Telephone Encounter (Signed)
 Left message for pt to call our office and ask for the preop team to go over medications.

## 2024-04-17 NOTE — Telephone Encounter (Signed)
 Spoke with pt and went over meds.  Pt wants to know about holding Warfarin for procedure and was wondering if should be off Jardiance . Pt stated due to Jardiance  can increase the risk of UTI. Pt also stated that after taking Jardiance  in the morning feels light headed.

## 2024-04-18 ENCOUNTER — Telehealth

## 2024-04-19 ENCOUNTER — Ambulatory Visit: Attending: Cardiology

## 2024-04-19 DIAGNOSIS — Z01810 Encounter for preprocedural cardiovascular examination: Secondary | ICD-10-CM | POA: Diagnosis not present

## 2024-04-19 NOTE — Progress Notes (Signed)
 Virtual Visit via Telephone Note   Because of Daniel Schaefer co-morbid illnesses, he is at least at moderate risk for complications without adequate follow up.  This format is felt to be most appropriate for this patient at this time.  Due to technical limitations with video connection (technology), today's appointment will be conducted as an audio only telehealth visit, and Daniel Schaefer verbally agreed to proceed in this manner.   All issues noted in this document were discussed and addressed.  No physical exam could be performed with this format.  Evaluation Performed:  Preoperative cardiovascular risk assessment _____________   Date:  04/19/2024   Patient ID:  Daniel Schaefer, DOB Dec 12, 1950, MRN [id number] Patient Location:  Home Provider location:   Office  Primary Care Provider:  Orpha Yancey LABOR, MD Primary Cardiologist:  Jayson Sierras, MD  Chief Complaint / Patient Profile   73 y.o. y/o male with a h/o atrial fibrillation, hypertension, prostate cancer, type 2 diabetes mellitus who is pending cystoscopy, uteroscopy with stent placement and presents today for telephonic preoperative cardiovascular risk assessment.  History of Present Illness    Daniel Schaefer is a 73 y.o. male who presents via audio/video conferencing for a telehealth visit today.  Pt was last seen in cardiology clinic on 09/01/2023 by Dr. Sierras.  At that time Daniel Schaefer was doing well.  The patient is now pending procedure as outlined above. Since his last visit, he has been feeling well without any issues with his heart. His medications were changed. He has been working out 6 days a week.   Per office protocol, patient can hold warfarin for 5 days prior to procedure.  Please resume when medically safe to do so.   Past Medical History    Past Medical History:  Diagnosis Date   Atrial fibrillation [facility])    Reportedly diagnosed November 2011   Essential hypertension    History of  kidney stones    Prostate cancer [facility])    Radiation implants   Type 2 diabetes mellitus (HCC)    Past Surgical History:  Procedure Laterality Date   COLONOSCOPY WITH PROPOFOL  N/A 12/02/2023   Procedure: COLONOSCOPY WITH PROPOFOL ;  Surgeon: Cinderella Deatrice FALCON, MD;  Location: [facility];  Service: Endoscopy;  Laterality: N/A;  7:30am;asa 3   Gold seed placement     HEMOSTASIS CLIP PLACEMENT  12/02/2023   Procedure: CONTROL OF HEMORRHAGE, GI TRACT, ENDOSCOPIC, BY CLIPPING OR OVERSEWING;  Surgeon: Cinderella Deatrice FALCON, MD;  Location: [facility];  Service: Endoscopy;;   INGUINAL HERNIA REPAIR Left 07/02/2020   Procedure: LEFT INGUINAL HERNIORRHAPY WITH MESH;  Surgeon: Mavis Anes, MD;  Location: [facility] ORS;  Service: General;  Laterality: Left;   LITHOTRIPSY     POLYPECTOMY  12/02/2023   Procedure: POLYPECTOMY;  Surgeon: Cinderella Deatrice FALCON, MD;  Location: [facility];  Service: Endoscopy;;   PROSTATE BIOPSY     TRIGGER FINGER RELEASE Left    ring finger   URETEROSCOPY WITH HOLMIUM LASER LITHOTRIPSY      Allergies  Allergies  Allergen Reactions   Bactrim [Sulfamethoxazole-Trimethoprim] Other (See Comments)    Thin his blood really bad   Other     Cannot mix warfarin and bactrim    Tresiba  Flextouch [Insulin  Degludec] Rash    Home Medications    Prior to Admission medications   Medication Sig Start Date End Date Taking? Authorizing Provider  albuterol (VENTOLIN HFA) 108 (90 Base) MCG/ACT inhaler Inhale into  the lungs.    [provider]  amLODipine  (NORVASC ) 5 MG tablet Take 1 tablet by mouth once daily 11/07/23   McDowell, Samuel G, MD  Azelastine HCl 137 MCG/SPRAY SOLN Place 2 sprays into both nostrils daily. 02/26/21   [provider]  Blood Glucose Monitoring Suppl (ACCU-CHEK GUIDE ME) w/Device KIT 1 Piece by Does not apply route as directed. 10/05/21   [name], Gebreselassie W, MD  Cholecalciferol (VITAMIN D3) 125 MCG (5000 UT) TABS Take 5,000 Units by mouth daily.      [provider]  empagliflozin  (JARDIANCE ) 10 MG TABS tablet Take 1 tablet (10 mg total) by mouth daily with breakfast. 03/27/24   [name], Gebreselassie W, MD  Flaxseed, Linseed, (FLAX SEEDS PO) Take by mouth.    [provider]  furosemide  (LASIX ) 20 MG tablet Take 1 tablet (20 mg total) by mouth daily. Please call [phone] to schedule an appointment for future refills. Thank you. 03/23/24   Debera Jayson MATSU, MD  GLOBAL EASE INJECT PEN NEEDLES 31G X 8 MM MISC USE 1 DAILY AS DIRECTED - RUN ON CASH $15 - PER PC. 12/21/17   [name], Ethelle ORN, MD  glucose blood (ACCU-CHEK GUIDE TEST) test strip Use to check blood glucose twice daily as directed. 02/16/24   [name], Gebreselassie W, MD  Landy Tea, Camellia sinensis, (GREEN TEA EXTRACT PO) Take 500 mg by mouth daily.    [provider]  insulin  glargine, 2 Unit Dial, (TOUJEO  MAX SOLOSTAR) 300 UNIT/ML Solostar Pen Inject 52 Units into the skin at bedtime. 03/27/24   [name], Gebreselassie W, MD  Insulin  Pen Needle (B-D ULTRAFINE III SHORT PEN) 31G X 8 MM MISC 1 each by Does not apply route as directed. 06/01/18   [name], Gebreselassie W, MD  Lancets MISC 1 each by Does not apply route as directed. 10/05/16   [name], Gebreselassie W, MD  levocetirizine (XYZAL) 5 MG tablet SMARTSIG:1 Tablet(s) By Mouth Every Evening 04/06/22   [provider]  melatonin 5 MG TABS Take 5 mg by mouth.    [provider]  metFORMIN  (GLUCOPHAGE ) 500 MG tablet Take 1 tablet by mouth once daily with breakfast 02/07/24   [name], Gebreselassie W, MD  olmesartan  (BENICAR ) 40 MG tablet Take 1 tablet (40 mg total) by mouth daily. REFILLS TO PCP 09/29/23   Darlean Ozell NOVAK, MD  Omega-3 Fat Ac-Cholecalciferol (SUPERIOR OMEGA3 W/ VITAMIN D  PO) Take by mouth daily.    [provider]  omeprazole (PRILOSEC) 20 MG capsule Take 20 mg by mouth daily.  12/24/19   [provider]  oxymetazoline (AFRIN) 0.05 % nasal spray Place 1 spray into both  nostrils 2 (two) times daily as needed for congestion.    [provider]  potassium chloride  (KLOR-CON ) 10 MEQ tablet TAKE 1 TABLET BY MOUTH DAILY 10/13/23   Debera Jayson MATSU, MD  pravastatin  (PRAVACHOL ) 40 MG tablet TAKE 1 TABLET BY MOUTH AT BEDTIME 02/16/24   Debera Jayson MATSU, MD  Selenium 200 MCG CAPS Take 1 capsule by mouth daily.    [provider]  tamsulosin  (FLOMAX ) 0.4 MG CAPS capsule Take 1 capsule by mouth once daily 04/04/24   Larocco, Sarah C, FNP  warfarin (COUMADIN ) 5 MG tablet TAKE 1 TABLET BY MOUTH ONCE DAILY EXCEPT TAKE 1/2 TABLET ON WEDNESDAYS OR AS DIRECTED 10/11/23   Debera Jayson MATSU, MD  Zinc 50 MG TABS Take by mouth.    [provider]    Physical Exam  Vital Signs:  Daniel Schaefer does not have vital signs available for review today.  Given telephonic nature of communication, physical exam is limited. AAOx3. NAD. Normal affect.  Speech and respirations are unlabored.  Accessory Clinical Findings    None  Assessment & Plan    1.  Preoperative Cardiovascular Risk Assessment:  Daniel Schaefer perioperative risk of a major cardiac event is 0.9% according to the Revised Cardiac Risk Index (RCRI).  Therefore, he is at low risk for perioperative complications.   His functional capacity is good at 5.62 METs according to the Duke Activity Status Index (DASI). Recommendations: According to ACC/AHA guidelines, no further cardiovascular testing needed.  The patient may proceed to surgery at acceptable risk.   Antiplatelet and/or Anticoagulation Recommendations:  Coumadin  can by held for 5 days prior to surgery.  Please resume post op when felt to be safe.   The patient was advised that if he develops new symptoms prior to surgery to contact our office to arrange for a follow-up visit, and he verbalized understanding.   A copy of this note will be routed to requesting surgeon.  Time:   Today, I have spent 13 minutes with the patient  with telehealth technology discussing medical history, symptoms, and management plan.     Daniel LOISE Fabry, PA-C  04/19/2024, 9:13 AM

## 2024-04-30 ENCOUNTER — Telehealth: Payer: Self-pay

## 2024-04-30 NOTE — Telephone Encounter (Signed)
 Tried to return pt's call, left a message requesting pt return call to the office.

## 2024-05-01 ENCOUNTER — Telehealth: Payer: Self-pay

## 2024-05-01 NOTE — Telephone Encounter (Signed)
 Pt stated he is scheduled for surgery on the 28th to remove a kidney stone. Pt takes Jardiance  10mg  daily, Toujeo  52 units at bedtime and Metformin  500mg  daily. Please advise of any needed adjustments.

## 2024-05-02 NOTE — Telephone Encounter (Signed)
 Spoke with pt and sent a MyChart message to pt per his request making him aware to reduce his Toujeo  to 30 units the night before his surgery and to hold his Jardiance  starting 1 day before surgery until he is clear to eat and drink per Dr.[name]'s orders.

## 2024-05-03 ENCOUNTER — Telehealth: Payer: Self-pay

## 2024-05-03 ENCOUNTER — Other Ambulatory Visit: Payer: Self-pay | Admitting: Urology

## 2024-05-03 ENCOUNTER — Other Ambulatory Visit

## 2024-05-03 DIAGNOSIS — N200 Calculus of kidney: Secondary | ICD-10-CM | POA: Diagnosis not present

## 2024-05-03 LAB — URINALYSIS, ROUTINE W REFLEX MICROSCOPIC
Bilirubin, UA: NEGATIVE
Ketones, UA: NEGATIVE
Leukocytes,UA: NEGATIVE
Nitrite, UA: NEGATIVE
Protein,UA: NEGATIVE
RBC, UA: NEGATIVE
Specific Gravity, UA: 1.015 (ref 1.005–1.030)
Urobilinogen, Ur: 0.2 mg/dL (ref 0.2–1.0)
pH, UA: 5.5 (ref 5.0–7.5)

## 2024-05-03 NOTE — Telephone Encounter (Signed)
 Please let him know what I sent in IM.  He will not need folder Daniel Schaefer will reach out with pre op appt.

## 2024-05-03 NOTE — Telephone Encounter (Signed)
 Patient did not receive his surgery folder. Needing instructions and prep for upcoming surgery.  Please advise.  Call:  [phone]

## 2024-05-07 ENCOUNTER — Other Ambulatory Visit: Payer: Self-pay | Admitting: Cardiology

## 2024-05-07 ENCOUNTER — Other Ambulatory Visit: Payer: Self-pay | Admitting: "Endocrinology

## 2024-05-07 ENCOUNTER — Encounter (HOSPITAL_COMMUNITY): Payer: Self-pay

## 2024-05-07 ENCOUNTER — Encounter (HOSPITAL_COMMUNITY)
Admission: RE | Admit: 2024-05-07 | Discharge: 2024-05-07 | Disposition: A | Discharge status: Home / Self Care | Source: Ambulatory Visit | Attending: Urology | Admitting: Urology

## 2024-05-07 NOTE — Patient Instructions (Signed)
 Daniel Schaefer  05/07/2024     @PREFPERIOPPHARMACY @   Your procedure is scheduled on 05/10/2024 .  [facility] to [facility] at 11:30 [facility].M.   Call this number if you have problems the morning of surgery:   [phone]  If you experience any cold or flu symptoms such as cough, fever, chills, shortness of breath, etc. between now and your scheduled surgery, please notify us  at the above number.   Remember:   Do not eat after midnight.   You may drink clear liquids until 9:30 AM .  Clear liquids allowed are:   Water  and gatorade                 Water  and Clear Sports drink (No red color; diabetics please choose diet or no sugar options)    Take these medicines the morning of surgery with A SIP OF WATER  : Albuterol Amlodipine  Claritin and Prilosec and Flomax .   Take 1/2 of your insulin  the nigh before the procedure   Do not take any insulin  the am of procedure.   Coumadin  last dose should be on 05/04/24    Do not wear jewelry, make-up or nail polish, including gel polish,  artificial nails, or any other type of covering on natural nails (fingers and  toes).  Do not wear lotions, powders, or perfumes, or deodorant.  Do not shave 48 hours prior to surgery.  Men may shave face and neck.  Do not bring valuables to the hospital.  [facility] is not responsible for any belongings or valuables.  Contacts, dentures or bridgework may not be worn into surgery.  Leave your suitcase in the car.  After surgery it may be brought to your room.  For patients admitted to the hospital, discharge time will be determined by your treatment team.  Patients discharged the day of surgery will not be allowed to drive home.   Name and phone number of your driver:   family Special instructions:  n/a  Please read over the following fact sheets that you were given. Care and Recovery After Surgery    Cystoscopy A cystoscopy may be done to find or treat a condition in your lower urinary tract. Your  lower urinary tract includes your bladder and urethra. The urethra is the part of your body that drains pee (urine) from your bladder. You may need this procedure if: You have: Urinary tract infections (UTIs) that keep coming back. Blood in your pee. Pain when you pee. A blockage in your urethra, such as a urinary stone. You can't control when you pee, or you have to pee a lot. There are cells that aren't normal in your pee sample. A problem is found in your bladder during a test. You need a biopsy. This is when a small piece of tissue is removed for testing. Tell a health care provider about: Any allergies you have. All medicines you take. These include vitamins, herbs, eye drops, and creams. Any problems you or family members have had with anesthesia. Any bleeding problems you have. Any surgeries you've had. Any medical conditions you have. Whether you're pregnant or may be pregnant. What are the risks? Your health care provider will talk with you about risks. These may include: Infection. Bleeding. Allergic reactions to medicines. Damage to your urethra or bladder. What happens before the procedure? Medicines Ask about changing or stopping: Any medicines you take. Any vitamins, herbs, or supplements you take. Do not take aspirin or ibuprofen unless you're  told to. Tests You may have an exam or tests. These may include: Pee tests to check for signs of infection. X-rays of: Your bladder. Your urethra. Your kidneys. A CT scan of your belly or hips. General instructions Eat and drink only as you've been told. For your safety, you may: Need to wash your skin with a soap that kills germs. Get antibiotics. Have hair removed at the procedure site. If you'll be going home right after the procedure, plan to have a responsible adult: Take you home from the hospital or clinic. You won't be allowed to drive. Stay with you for the time you're told. What happens during the  procedure?  You may be given: A sedative to help you relax. Anesthesia to keep you from feeling pain. The opening of your urethra will be cleaned. A thin tube called a cystoscope will be put into your urethra. The tube has a light and camera on the end of it. The tube will be passed into your bladder. A germ-free (sterile) fluid will flow through the tube. The fluid will stretch your bladder. This helps your provider see the walls of your bladder more clearly. A biopsy may be taken. Stones may be removed. The tube will be taken out. Your bladder will be emptied. The procedure may vary among providers and hospitals. What can I expect after the procedure? It's common to have: Some soreness or pain in your belly and urethra. Mild pain or burning when you pee. The pain should stop a few minutes after you pee. This may last for up to a week. A small amount of blood in your pee for a few days. A feeling like you need to pee often. But when you do, you may only pee a little. Follow these instructions at home: Medicines Take your medicines only as told. If you were given antibiotics, take them as told. Do not stop taking them even if you start to feel better. General instructions If you were given a sedative, do not drive or use machines until you're told it's safe. A sedative can make you sleepy. Eat and drink as told. If a biopsy was taken, ask when your test results will be ready and how to get them. You may need to call or meet with your provider to get your results. Ask what things are safe for you to do at home. Ask when you can go back to work or school. Contact a health care provider if: Your pain gets worse. Your pain doesn't get better with medicine. You have trouble peeing. You have more blood in your pee. You have a fever or chills. Get help right away if: You have blood clots in your pee. You can't pee. This information is not intended to replace advice given to you by your  health care provider. Make sure you discuss any questions you have with your health care provider. Document Revised: 01/04/2023 Document Reviewed: 01/04/2023 Elsevier Patient Education  2024 [employer]  General Anesthesia, Adult General anesthesia is the use of medicine to make you fall asleep (unconscious) for a medical procedure. General anesthesia must be used for certain procedures. It is often recommended for surgery or procedures that: Last a long time. Require you to be still or in an unusual position. Are major and can cause blood loss. Affect your breathing. The medicines used for general anesthesia are called general anesthetics. During general anesthesia, these medicines are given along with medicines that: Prevent pain. Control your blood pressure.  Relax your muscles. Prevent nausea and vomiting after the procedure. Tell a health care provider about: Any allergies you have. All medicines you are taking, including vitamins, herbs, eye drops, creams, and over-the-counter medicines. Your history of any: Medical conditions you have, including: High blood pressure. Bleeding problems. Diabetes. Heart or lung conditions, such as: Heart failure. Sleep apnea. Asthma. Chronic obstructive pulmonary disease (COPD). Current or recent illnesses, such as: Upper respiratory, chest, or ear infections. Cough or fever. Tobacco or drug use, including marijuana or alcohol use. Depression or anxiety. Surgeries and types of anesthetics you have had. Problems you or family members have had with anesthetic medicines. Whether you are pregnant or may be pregnant. Whether you have any chipped or loose teeth, dentures, caps, bridgework, or issues with your mouth, swallowing, or choking. What are the risks? Your health care provider will talk with you about risks. These may include: Allergic reaction to the medicines. Lung and heart problems. Inhaling food or liquid from the stomach into  the lungs (aspiration). Nerve injury. Injury to the lips, mouth, teeth, or gums. Stroke. Waking up during your procedure and being unable to move. This is rare. These problems are more likely to develop if you are having a major surgery or if you have an advanced or serious medical condition. You can prevent some of these complications by answering all of your health care provider's questions thoroughly and by following all instructions before your procedure. General anesthesia can cause side effects, including: Nausea or vomiting. A sore throat or hoarseness from the breathing tube. Wheezing or coughing. Shaking chills or feeling cold. Body aches. Sleepiness. Confusion, agitation (delirium), or anxiety. What happens before the procedure? When to stop eating and drinking Follow instructions from your health care provider about what you may eat and drink before your procedure. If you do not follow your health care provider's instructions, your procedure may be delayed or canceled. Medicines Ask your health care provider about: Changing or stopping your regular medicines. These include any diabetes medicines or blood thinners you take. Taking medicines such as aspirin and ibuprofen. These medicines can thin your blood. Do not take them unless your health care provider tells you to. Taking over-the-counter medicines, vitamins, herbs, and supplements. General instructions Do not use any products that contain nicotine or tobacco for at least 4 weeks before the procedure. These products include cigarettes, chewing tobacco, and vaping devices, such as e-cigarettes. If you need help quitting, ask your health care provider. If you brush your teeth on the morning of the procedure, make sure to spit out all of the water  and toothpaste. If told by your health care provider, bring your sleep apnea device with you to surgery (if applicable). If you will be going home right after the procedure, plan to  have a responsible adult: Take you home from the hospital or clinic. You will not be allowed to drive. Care for you for the time you are told. What happens during the procedure?  An IV will be inserted into one of your veins. You will be given one or more of the following through a face mask or IV: A sedative. This helps you relax. Anesthesia. This will: Numb certain areas of your body. Make you fall asleep for surgery. After you are unconscious, a breathing tube may be inserted down your throat to help you breathe. This will be removed before you wake up. An anesthesia provider, such as an anesthesiologist, will stay with you throughout your procedure. The anesthesia  provider will: Keep you comfortable and safe by continuing to give you medicines and adjusting the amount of medicine that you get. Monitor your blood pressure, heart rate, and oxygen levels to make sure that the anesthetics do not cause any problems. The procedure may vary among health care providers and hospitals. What happens after the procedure? Your blood pressure, temperature, heart rate, breathing rate, and blood oxygen level will be monitored until you leave the hospital or clinic. You will wake up in a recovery area. You may wake up slowly. You may be given medicine to help you with pain, nausea, or any other side effects from the anesthesia. Summary General anesthesia is the use of medicine to make you fall asleep (unconscious) for a medical procedure. Follow your health care provider's instructions about when to stop eating, drinking, or taking certain medicines before your procedure. Plan to have a responsible adult take you home from the hospital or clinic. This information is not intended to replace advice given to you by your health care provider. Make sure you discuss any questions you have with your health care provider. Document Revised: 11/26/2021 Document Reviewed: 11/26/2021 Elsevier Patient Education   2024 [employer] Inc.  How to Use Chlorhexidine  at Home in the Shower Chlorhexidine  gluconate (CHG) is a germ-killing (antiseptic) wash that's used to clean the skin. It can get rid of the germs that normally live on the skin and can keep them away for about 24 hours. If you're having surgery, you may be told to shower with CHG at home the night before surgery. This can help lower your risk for infection. To use CHG wash in the shower, follow the steps below. Supplies needed: CHG body wash. Clean washcloth. Clean towel. How to use CHG in the shower Follow these steps unless you're told to use CHG in a different way: Start the shower. Use your normal soap and shampoo to wash your face and hair. Turn off the shower or move out of the shower stream. Pour CHG onto a clean washcloth. Do not use any type of brush or rough sponge. Start at your neck, washing your body down to your toes. Make sure you: Wash the part of your body where the surgery will be done for at least 1 minute. Do not scrub. Do not use CHG on your head or face unless your health care provider tells you to. If it gets into your ears or eyes, rinse them well with water . Do not wash your genitals with CHG. Wash your back and under your arms. Make sure to wash skin folds. Let the CHG sit on your skin for 1-2 minutes or as long as told. Rinse your entire body in the shower, including all body creases and folds. Turn off the shower. Dry off with a clean towel. Do not put anything on your skin afterward, such as powder, lotion, or perfume. Put on clean clothes or pajamas. If it's the night before surgery, sleep in clean sheets. General tips Use CHG only as told, and follow the instructions on the label. Use the full amount of CHG as told. This is often one bottle. Do not smoke and stay away from flames after using CHG. Your skin may feel sticky after using CHG. This is normal. The sticky feeling will go away as the CHG dries. Do  not use CHG: If you have a chlorhexidine  allergy or have reacted to chlorhexidine  in the past. On open wounds or areas of skin that have broken skin,  cuts, or scrapes. On babies younger than 20 months of age. Contact a health care provider if: You have questions about using CHG. Your skin gets irritated or itchy. You have a rash after using CHG. You swallow any CHG. Call your local poison control center [phone] in the [place] Your eyes itch badly, or they become very red or swollen. Your hearing changes. You have trouble seeing. If you can't reach your provider, go to an urgent care or emergency room. Do not drive yourself. Get help right away if: You have swelling or tingling in your mouth or throat. You make high-pitched whistling sounds when you breathe, most often when you breathe out (wheeze). You have trouble breathing. These symptoms may be an emergency. Call 911 right away. Do not wait to see if the symptoms will go away. Do not drive yourself to the hospital. This information is not intended to replace advice given to you by your health care provider. Make sure you discuss any questions you have with your health care provider. Document Revised: 03/15/2023 Document Reviewed: 03/11/2022 Elsevier Patient Education  2024 [employer].

## 2024-05-10 ENCOUNTER — Ambulatory Visit (HOSPITAL_BASED_OUTPATIENT_CLINIC_OR_DEPARTMENT_OTHER): Admitting: Certified Registered"

## 2024-05-10 ENCOUNTER — Ambulatory Visit (HOSPITAL_COMMUNITY)

## 2024-05-10 ENCOUNTER — Ambulatory Visit (HOSPITAL_COMMUNITY): Admitting: Certified Registered"

## 2024-05-10 ENCOUNTER — Other Ambulatory Visit: Payer: Self-pay | Admitting: Urology

## 2024-05-10 ENCOUNTER — Encounter (HOSPITAL_COMMUNITY): Payer: Self-pay | Admitting: Urology

## 2024-05-10 ENCOUNTER — Other Ambulatory Visit: Payer: Self-pay

## 2024-05-10 ENCOUNTER — Ambulatory Visit (HOSPITAL_COMMUNITY)
Admission: RE | Admit: 2024-05-10 | Discharge: 2024-05-10 | Disposition: A | Discharge status: Home / Self Care | Attending: Urology | Admitting: Urology

## 2024-05-10 ENCOUNTER — Encounter (HOSPITAL_COMMUNITY)
Admission: RE | Disposition: A | Discharge status: Home / Self Care | Payer: Self-pay | Source: Home / Self Care | Attending: Urology

## 2024-05-10 DIAGNOSIS — E119 Type 2 diabetes mellitus without complications: Secondary | ICD-10-CM | POA: Insufficient documentation

## 2024-05-10 DIAGNOSIS — N138 Other obstructive and reflux uropathy: Secondary | ICD-10-CM | POA: Insufficient documentation

## 2024-05-10 DIAGNOSIS — N201 Calculus of ureter: Secondary | ICD-10-CM

## 2024-05-10 DIAGNOSIS — N200 Calculus of kidney: Secondary | ICD-10-CM | POA: Diagnosis present

## 2024-05-10 DIAGNOSIS — N401 Enlarged prostate with lower urinary tract symptoms: Secondary | ICD-10-CM | POA: Diagnosis not present

## 2024-05-10 DIAGNOSIS — I10 Essential (primary) hypertension: Secondary | ICD-10-CM | POA: Diagnosis not present

## 2024-05-10 DIAGNOSIS — Z794 Long term (current) use of insulin: Secondary | ICD-10-CM | POA: Insufficient documentation

## 2024-05-10 DIAGNOSIS — N202 Calculus of kidney with calculus of ureter: Secondary | ICD-10-CM

## 2024-05-10 DIAGNOSIS — Z7984 Long term (current) use of oral hypoglycemic drugs: Secondary | ICD-10-CM | POA: Insufficient documentation

## 2024-05-10 HISTORY — PX: CYSTOSCOPY/URETEROSCOPY/HOLMIUM LASER/STENT PLACEMENT: SHX6546

## 2024-05-10 LAB — GLUCOSE, CAPILLARY
Glucose-Capillary: 101 mg/dL — ABNORMAL HIGH (ref 70–99)
Glucose-Capillary: 90 mg/dL (ref 70–99)

## 2024-05-10 SURGERY — CYSTOSCOPY/URETEROSCOPY/HOLMIUM LASER/STENT PLACEMENT
Anesthesia: General | Site: Ureter | Laterality: Left

## 2024-05-10 MED ORDER — ONDANSETRON HCL 4 MG PO TABS: 4.0000 mg | ORAL_TABLET | Freq: Every day | ORAL | 1 refills | Status: DC | PRN

## 2024-05-10 MED ORDER — OXYCODONE-ACETAMINOPHEN 5-325 MG PO TABS: 1.0000 | ORAL_TABLET | ORAL | 0 refills | Status: DC | PRN

## 2024-05-10 MED ORDER — PROPOFOL 10 MG/ML IV BOLUS
INTRAVENOUS | Status: AC
  Filled 2024-05-10: qty 20

## 2024-05-10 MED ORDER — OXYCODONE HCL 5 MG PO TABS: 5.0000 mg | ORAL_TABLET | Freq: Once | ORAL | Status: DC | PRN

## 2024-05-10 MED ORDER — ONDANSETRON HCL 4 MG/2ML IJ SOLN
INTRAMUSCULAR | Status: DC | PRN
  Administered 2024-05-10: 4 mg via INTRAVENOUS

## 2024-05-10 MED ORDER — PROPOFOL 10 MG/ML IV BOLUS
INTRAVENOUS | Status: DC | PRN
  Administered 2024-05-10: 130 mg via INTRAVENOUS

## 2024-05-10 MED ORDER — ONDANSETRON HCL 4 MG/2ML IJ SOLN: 4.0000 mg | Freq: Once | INTRAMUSCULAR | Status: DC | PRN

## 2024-05-10 MED ORDER — DEXAMETHASONE SODIUM PHOSPHATE 4 MG/ML IJ SOLN
INTRAMUSCULAR | Status: DC | PRN
  Administered 2024-05-10: 4 mg via INTRAVENOUS

## 2024-05-10 MED ORDER — DIATRIZOATE MEGLUMINE 30 % UR SOLN
URETHRAL | Status: DC | PRN
  Administered 2024-05-10: 9 mL via URETHRAL

## 2024-05-10 MED ORDER — FENTANYL CITRATE (PF) 100 MCG/2ML IJ SOLN
INTRAMUSCULAR | Status: AC
Start: 2024-05-10 — End: 2024-05-10
  Filled 2024-05-10: qty 2

## 2024-05-10 MED ORDER — FENTANYL CITRATE (PF) 100 MCG/2ML IJ SOLN
INTRAMUSCULAR | Status: DC | PRN
  Administered 2024-05-10 (×4): 25 ug via INTRAVENOUS

## 2024-05-10 MED ORDER — CEFAZOLIN SODIUM-DEXTROSE 2-4 GM/100ML-% IV SOLN
2.0000 g | INTRAVENOUS | Status: AC
  Administered 2024-05-10: 2 g via INTRAVENOUS
  Filled 2024-05-10: qty 100

## 2024-05-10 MED ORDER — WATER FOR IRRIGATION, STERILE IR SOLN
Status: DC | PRN
  Administered 2024-05-10: 1000 mL

## 2024-05-10 MED ORDER — OXYCODONE HCL 5 MG/5ML PO SOLN: 5.0000 mg | Freq: Once | ORAL | Status: DC | PRN

## 2024-05-10 MED ORDER — CHLORHEXIDINE GLUCONATE 0.12 % MT SOLN
15.0000 mL | Freq: Once | OROMUCOSAL | Status: AC
  Administered 2024-05-10: 15 mL via OROMUCOSAL

## 2024-05-10 MED ORDER — FENTANYL CITRATE PF 50 MCG/ML IJ SOSY: 25.0000 ug | PREFILLED_SYRINGE | INTRAMUSCULAR | Status: DC | PRN

## 2024-05-10 MED ORDER — SODIUM CHLORIDE 0.9 % IR SOLN
Status: DC | PRN
  Administered 2024-05-10: 3000 mL

## 2024-05-10 MED ORDER — LACTATED RINGERS IV SOLN: INTRAVENOUS | Status: DC

## 2024-05-10 SURGICAL SUPPLY — 18 items
BAG DRAIN URO TABLE W/ADPT NS (BAG) ×2 IMPLANT
CLOTH BEACON ORANGE TIMEOUT ST (SAFETY) ×2 IMPLANT
GLOVE BIO SURGEON STRL SZ8 (GLOVE) ×2 IMPLANT
GLOVE BIOGEL PI IND STRL 7.0 (GLOVE) ×4 IMPLANT
GOWN STRL REUS W/TWL LRG LVL3 (GOWN DISPOSABLE) ×2 IMPLANT
GOWN STRL REUS W/TWL XL LVL3 (GOWN DISPOSABLE) ×2 IMPLANT
GUIDEWIRE ANG ZIPWIRE 038X150 (WIRE) ×2 IMPLANT
GUIDEWIRE STR DUAL SENSOR (WIRE) ×2 IMPLANT
KIT TURNOVER CYSTO (KITS) ×2 IMPLANT
MANIFOLD NEPTUNE II (INSTRUMENTS) ×2 IMPLANT
PACK CYSTO (CUSTOM PROCEDURE TRAY) ×2 IMPLANT
PAD ARMBOARD POSITIONER FOAM (MISCELLANEOUS) ×2 IMPLANT
POSITIONER HEAD 8X9X4 ADT (SOFTGOODS) ×2 IMPLANT
SOL .9 NS 3000ML IRR UROMATIC (IV SOLUTION) ×4 IMPLANT
STENT URET 6FRX26 CONTOUR (STENTS) ×1 IMPLANT
SYR 10ML LL (SYRINGE) ×2 IMPLANT
TOWEL OR 17X26 4PK STRL BLUE (TOWEL DISPOSABLE) ×2 IMPLANT
WATER STERILE IRR 500ML POUR (IV SOLUTION) ×2 IMPLANT

## 2024-05-10 NOTE — Discharge Instructions (Signed)

## 2024-05-10 NOTE — Transfer of Care (Signed)
 Immediate Anesthesia Transfer of Care Note  Patient: Daniel Schaefer  Procedure(s) Performed: LEFT DIAGNOSTIC URETEROSCOPY, LEFT STENT PLACEMENT (Left: Ureter)  Patient Location: PACU  Anesthesia Type:General  Level of Consciousness: awake, alert , and oriented  Airway & Oxygen Therapy: Patient Spontanous Breathing  Post-op Assessment: Report given to RN and Post -op Vital signs reviewed and stable  Post vital signs: Reviewed and stable  Last Vitals:  Vitals Value Taken Time  BP 142/85 05/10/24 14:45  Temp    Pulse 49 05/10/24 14:47  Resp 9 05/10/24 14:47  SpO2 97 % 05/10/24 14:47  Vitals shown include unfiled device data.  Last Pain:  Vitals:   05/10/24 1207  TempSrc: Oral  PainSc: 0-No pain      Patients Stated Pain Goal: 5 (05/10/24 1207)  Complications: No notable events documented.

## 2024-05-10 NOTE — H&P (Signed)
 History of Present Illness: Daniel Schaefer is a 73 y.o. male who presents today for follow up visit at [facility] [place]. Relevant History includes: 1. Prostate cancer.  - Treated with EXRT and brachytherapy seed implants in 2003.  2. Prostatomegaly with LUTS (nocturia, occasional urgency, rare urge incontinence, weak stream). 3. Kidney stones. 4. Hypogonadism. 5. Erectile dysfunction. Previously had Tadalafil.   At last visit with Dr. Watt on 03/24/2023: - He didn't go for the KUB ordered for prior to this visit for the 12mm LLP stone but he had a CT in 1/24 that showed a stable 12mm LLP stone and a 7mm RLP stone. He has a history of ED and has tadalafil but doesn't use it. He had a left inguinal hernia repair last year and he has had some penile retraction since. He has hypogonadism with the last testosterone  level at 165 in 7/23. He is on tamsulosin  for LUTS. - The plan was: 1. History of prostate cancer.  PSA is stable.  Return in a year.  2. BPH with BOO and mild LUTS.   I refilled the tamsulosin . 3. Testicular atrophy.  His T level is 165.  I discussed TRT but he would like to hold off. 4. ED.  He hasn't been using the tadalafil and hasn't been active.  5. Renal stone.  He has stable 12mm LLP stone or stone cluster and smaller RLP stone.  I will reassess with a KUB in 1 year.   Since last visit: > 03/29/2024:  - PSA stable (0.3). - KUB showed multiple bilateral intrarenal calculi. Largest stone on the left measures 18 mm. Largest stone on the right measures 3 mm.    Today: He reports nocturia x2-3. Denies urgency, dysuria, gross hematuria, weak urinary stream, hesitancy, straining to void, or sensations of incomplete emptying. Denies flank pain or abdominal pain. He is bothered by his urinary frequency, particularly when at church. He denies being sexually active.   Medications:       Current Outpatient Medications  Medication Sig Dispense Refill   albuterol  (VENTOLIN HFA) 108 (90 Base) MCG/ACT inhaler Inhale into the lungs.       amLODipine  (NORVASC ) 5 MG tablet Take 1 tablet by mouth once daily 90 tablet 1   Azelastine HCl 137 MCG/SPRAY SOLN Place 2 sprays into both nostrils daily.       Blood Glucose Monitoring Suppl (ACCU-CHEK GUIDE ME) w/Device KIT 1 Piece by Does not apply route as directed. 1 kit 0   Cholecalciferol (VITAMIN D3) 125 MCG (5000 UT) TABS Take 5,000 Units by mouth daily.        empagliflozin  (JARDIANCE ) 10 MG TABS tablet Take 1 tablet (10 mg total) by mouth daily with breakfast. 90 tablet 1   Flaxseed, Linseed, (FLAX SEEDS PO) Take by mouth.       furosemide  (LASIX ) 20 MG tablet Take 1 tablet (20 mg total) by mouth daily. Please call [phone] to schedule an appointment for future refills. Thank you. 90 tablet 1   GLOBAL EASE INJECT PEN NEEDLES 31G X 8 MM MISC USE 1 DAILY AS DIRECTED - RUN ON CASH $15 - PER PC. 100 each 5   glucose blood (ACCU-CHEK GUIDE TEST) test strip Use to check blood glucose twice daily as directed. 100 each 2   Green Tea, Camellia sinensis, (GREEN TEA EXTRACT PO) Take 500 mg by mouth daily.       insulin  glargine, 2 Unit Dial, (TOUJEO  MAX SOLOSTAR) 300 UNIT/ML Solostar Pen Inject  52 Units into the skin at bedtime. 15 mL 1   Insulin  Pen Needle (B-D ULTRAFINE III SHORT PEN) 31G X 8 MM MISC 1 each by Does not apply route as directed. 50 each 2   Lancets MISC 1 each by Does not apply route as directed. 100 each 3   levocetirizine (XYZAL) 5 MG tablet SMARTSIG:1 Tablet(s) By Mouth Every Evening       melatonin 5 MG TABS Take 5 mg by mouth.       metFORMIN  (GLUCOPHAGE ) 500 MG tablet Take 1 tablet by mouth once daily with breakfast 90 tablet 0   olmesartan  (BENICAR ) 40 MG tablet Take 1 tablet (40 mg total) by mouth daily. REFILLS TO PCP 30 tablet 0   Omega-3 Fat Ac-Cholecalciferol (SUPERIOR OMEGA3 W/ VITAMIN D  PO) Take by mouth daily.       omeprazole (PRILOSEC) 20 MG capsule Take 20 mg by mouth daily.         oxymetazoline (AFRIN) 0.05 % nasal spray Place 1 spray into both nostrils 2 (two) times daily as needed for congestion.       potassium chloride  (KLOR-CON ) 10 MEQ tablet TAKE 1 TABLET BY MOUTH DAILY 100 tablet 2   pravastatin  (PRAVACHOL ) 40 MG tablet TAKE 1 TABLET BY MOUTH AT BEDTIME 90 tablet 1   Selenium 200 MCG CAPS Take 1 capsule by mouth daily.       tamsulosin  (FLOMAX ) 0.4 MG CAPS capsule Take 1 capsule by mouth once daily 90 capsule 0   warfarin (COUMADIN ) 5 MG tablet TAKE 1 TABLET BY MOUTH ONCE DAILY EXCEPT TAKE 1/2 TABLET ON WEDNESDAYS OR AS DIRECTED 35 tablet 5   Zinc 50 MG TABS Take by mouth.          No current facility-administered medications for this visit.        Allergies: Allergies       Allergies  Allergen Reactions   Bactrim [Sulfamethoxazole-Trimethoprim] Other (See Comments)      Thin his blood really bad   Other        Cannot mix warfarin and bactrim    Tresiba  Flextouch [Insulin  Degludec] Rash            Past Medical History:  Diagnosis Date   Atrial fibrillation [facility])      Reportedly diagnosed November 2011   Essential hypertension     History of kidney stones     Prostate cancer [facility])      Radiation implants   Type 2 diabetes mellitus (HCC)               Past Surgical History:  Procedure Laterality Date   COLONOSCOPY WITH PROPOFOL  N/A 12/02/2023    Procedure: COLONOSCOPY WITH PROPOFOL ;  Surgeon: Cinderella Deatrice FALCON, MD;  Location: [facility];  Service: Endoscopy;  Laterality: N/A;  7:30am;asa 3   Gold seed placement       HEMOSTASIS CLIP PLACEMENT   12/02/2023    Procedure: CONTROL OF HEMORRHAGE, GI TRACT, ENDOSCOPIC, BY CLIPPING OR OVERSEWING;  Surgeon: Cinderella Deatrice FALCON, MD;  Location: [facility];  Service: Endoscopy;;   INGUINAL HERNIA REPAIR Left 07/02/2020    Procedure: LEFT INGUINAL HERNIORRHAPY WITH MESH;  Surgeon: Mavis Anes, MD;  Location: [facility] OR[facility];  Service: General;  Laterality: Left;   LITHOTRIPSY       POLYPECTOMY   12/02/2023     Procedure: POLYPECTOMY;  Surgeon: Cinderella Deatrice FALCON, MD;  Location: [facility];  Service: Endoscopy;;   PROSTATE BIOPSY  TRIGGER FINGER RELEASE Left      ring finger   URETEROSCOPY WITH HOLMIUM LASER LITHOTRIPSY                 Family History  Problem Relation Age of Onset   Cancer Father 14        Laryngeal   Lymphoma Mother 60        Social History         Socioeconomic History   Marital status: Divorced      Spouse name: Not on file   Number of children: Not on file   Years of education: Not on file   Highest education level: Not on file  Occupational History   Not on file  Tobacco Use   Smoking status: Never   Smokeless tobacco: Never  Vaping Use   Vaping status: Never Used  Substance and Sexual Activity   Alcohol use: No      Alcohol/week: 0.0 standard drinks of alcohol   Drug use: No   Sexual activity: Yes  Other Topics Concern   Not on file  Social History Narrative   Not on file    Social Drivers of Health    Financial Resource Strain: Not on file  Food Insecurity: Not on file  Transportation Needs: Not on file  Physical Activity: Not on file  Stress: Not on file  Social Connections: Not on file  Intimate Partner Violence: Not on file      SUBJECTIVE   Review of Systems Constitutional: Patient denies any unintentional weight loss or change in strength lntegumentary: Patient denies any rashes or pruritus Cardiovascular: Patient denies chest pain or syncope Respiratory: Patient denies shortness of breath Gastrointestinal: Patient denies nausea, vomiting, constipation, or diarrhea  Musculoskeletal: Patient denies muscle cramps or weakness Neurologic: Patient denies convulsions or seizures Allergic/Immunologic: Patient denies recent allergic reaction(s) Hematologic/Lymphatic: Patient denies bleeding tendencies Endocrine: Patient denies heat/cold intolerance   GU: As per HPI.   OBJECTIVE    Vitals:    04/05/24 1343  BP: (!)  163/93  Pulse: 76    There is no height or weight on file to calculate BMI.   Physical Examination Constitutional: No obvious distress; patient is non-toxic appearing  Cardiovascular: No visible lower extremity edema.  Respiratory: The patient does not have audible wheezing/stridor; respirations do not appear labored  Gastrointestinal: Abdomen non-distended Musculoskeletal: Normal ROM of UEs  Skin: No obvious rashes/open sores  Neurologic: CN 2-12 grossly intact Psychiatric: Answered questions appropriately with normal affect  Hematologic/Lymphatic/Immunologic: No obvious bruises or sites of spontaneous bleeding   UA: glucosuria (secondary to Jardiance  use), otherwise unremarkable PVR: 240 ml (patient stated he hadn't emptied completely when he voided; rushed)   ASSESSMENT History of adenocarcinoma of prostate - Plan: Urinalysis, Routine w reflex microscopic   BPH with urinary obstruction - Plan: BLADDER SCAN AMB NON-IMAGING   Renal stones   Hypogonadism in male   Erectile dysfunction due to arterial insufficiency   Weak urinary stream   For bothersome urinary frequency we discussed option for him to shop online or at medical supply store(s) for external male catheter as desired (no prescription needed); showed him some examples online. He verbalized understanding that his urinary frequency exacerbated by his Lasix  and Jardiance  use.    We reviewed recent imaging results, in particular that his left stone burden has increased compared to prior and that proactive intervention is advised to prevent risk for developing hydronephrosis, kidney damage, etc. We discussed the various treatment options  including extracorporeal shock wave lithotripsy (ESWL) and ureteroscopic stone manipulation (URS). We discussed possible risks and benefits of intervention including but not limited to: including pain, infection, sepsis, UTI, ureter perforation, need for stenting, post-op ureteral stricture,  hematuria, possible need for follow up procedures.  Patient verbalized preference for ureteroscopic stone manipulation due to outpatient procedure copay and lower risk for needing a secondary procedure; will consult with Dr. Sherrilee and notify patient of his surgical recommendation.   For LUTS he plans to continue Flomax  (Tamsulosin ) 0.4 mg daily; refills are up to date.   Patient verbalized understanding of and agreement with current plan. All questions were answered.   PLAN -We discussed the management of kidney stones. These options include observation, ureteroscopy, shockwave lithotripsy (ESWL) and percutaneous nephrolithotomy (PCNL). We discussed which options are relevant to the patient's stone(s). We discussed the natural history of kidney stones as well as the complications of untreated stones and the impact on quality of life without treatment as well as with each of the above listed treatments. We also discussed the efficacy of each treatment in its ability to clear the stone burden. With any of these management options I discussed the signs and symptoms of infection and the need for emergent treatment should these be experienced. For each option we discussed the ability of each procedure to clear the patient of their stone burden.   For observation I described the risks which include but are not limited to silent renal damage, life-threatening infection, need for emergent surgery, failure to pass stone and pain.   For ureteroscopy I described the risks which include bleeding, infection, damage to contiguous structures, positioning injury, ureteral stricture, ureteral avulsion, ureteral injury, need for prolonged ureteral stent, inability to perform ureteroscopy, need for an interval procedure, inability to clear stone burden, stent discomfort/pain, heart attack, stroke, pulmonary embolus and the inherent risks with general anesthesia.   For shockwave lithotripsy I described the risks which  include arrhythmia, kidney contusion, kidney hemorrhage, need for transfusion, pain, inability to adequately break up stone, inability to pass stone fragments, Steinstrasse, infection associated with obstructing stones, need for alternate surgical procedure, need for repeat shockwave lithotripsy, MI, CVA, PE and the inherent risks with anesthesia/conscious sedation.   For PCNL I described the risks including positioning injury, pneumothorax, hydrothorax, need for chest tube, inability to clear stone burden, renal laceration, arterial venous fistula or malformation, need for embolization of kidney, loss of kidney or renal function, need for repeat procedure, need for prolonged nephrostomy tube, ureteral avulsion, MI, CVA, PE and the inherent risks of general anesthesia.   - The patient would like to proceed with LEFT URETEROSCOPIC STONE EXTRACTION

## 2024-05-10 NOTE — Op Note (Signed)
.  Preoperative diagnosis: Left ureteral stone  Postoperative diagnosis: Same  Procedure: 1 cystoscopy 2. Left retrograde pyelography 3.  Intraoperative fluoroscopy, under one hour, with interpretation 4.  Left diagnostic ureteroscopy 5.  Left 6 x 26 JJ stent placement  Attending: Belvie Standing  Anesthesia: General  Estimated blood loss: None  Drains: Left 6 x 26 JJ ureteral stent without tether  Specimens: none  Antibiotics: ancef   Findings: left lower pole stone. No hydronephrosis. Ureteral narrowing at the mid ureter requiring stent placement. No masses/lesions in the bladder. Ureteral orifices in normal anatomic location.  Indications: Patient is a 73 year old male with a history of left renal stone which has been growing in size.  After discussing treatment options, he decided proceed with left ureteroscopic stone manipulation.  Procedure in detail: The patient was brought to the operating room and a brief timeout was done to ensure correct patient, correct procedure, correct site.  General anesthesia was administered patient was placed in dorsal lithotomy position.  His genitalia was then prepped and draped in usual sterile fashion.  A rigid 22 French cystoscope was passed in the urethra and the bladder.  Bladder was inspected free masses or lesions.  the ureteral orifices were in the normal orthotopic locations.  a 6 french ureteral catheter was then instilled into the left ureteral orifice.  a gentle retrograde was obtained and findings noted above.  we then placed a zip wire through the ureteral catheter and advanced up to the renal pelvis.  we then removed the cystoscope and cannulated the left ureteral orifice with a semirigid ureteroscope. We advanced the scope to the mid ureter but due to significant ureteral narrowing we were unable to advance the scope any further. We elected to place a stent.  We then placed a 6 x 26 double-j ureteral stent over the original zip wire.  We  then removed the wire and good coil was noted in the the renal pelvis under fluoroscopy and the bladder under direct vision. the bladder was then drained and this concluded the procedure which was well tolerated by patient.  Complications: None  Condition: Stable, extubated, transferred to PACU  Plan: Patient is to be discharged home as to follow-up in 2 weeks for repeat ureteroscopic stone extraction

## 2024-05-10 NOTE — Anesthesia Postprocedure Evaluation (Signed)
 Anesthesia Post Note  Patient: BLANDON OFFERDAHL  Procedure(s) Performed: LEFT DIAGNOSTIC URETEROSCOPY, LEFT STENT PLACEMENT (Left: Ureter)  Patient location during evaluation: PACU Anesthesia Type: General Level of consciousness: awake, oriented, awake and alert and patient cooperative Pain management: pain level controlled Vital Signs Assessment: post-procedure vital signs reviewed and stable Respiratory status: spontaneous breathing, respiratory function stable and nonlabored ventilation Cardiovascular status: blood pressure returned to baseline and stable Anesthetic complications: no   No notable events documented.   Last Vitals:  Vitals:   05/10/24 1207 05/10/24 1445  BP: (!) 162/81   Pulse: (!) 57 (!) 51  Resp: 18 (!) 8  Temp: 36.9 C   SpO2: 100% 98%    Last Pain:  Vitals:   05/10/24 1207  TempSrc: Oral  PainSc: 0-No pain                 Adine LITTIE Anger

## 2024-05-10 NOTE — Anesthesia Preprocedure Evaluation (Signed)
 Anesthesia Evaluation  Patient identified by MRN, date of birth, ID band Patient awake    Reviewed: Allergy & Precautions, H&P , NPO status , Patient's Chart, lab work & pertinent test results, reviewed documented beta blocker date and time   Airway Mallampati: II  TM Distance: >3 FB Neck ROM: full    Dental no notable dental hx.    Pulmonary shortness of breath   Pulmonary exam normal breath sounds clear to auscultation       Cardiovascular Exercise Tolerance: Good hypertension, + Peripheral Vascular Disease   Rhythm:regular Rate:Normal     Neuro/Psych negative neurological ROS  negative psych ROS   GI/Hepatic negative GI ROS, Neg liver ROS,,,  Endo/Other  diabetes    Renal/GU Renal disease  negative genitourinary   Musculoskeletal   Abdominal   Peds  Hematology  (+) Blood dyscrasia, anemia   Anesthesia Other Findings   Reproductive/Obstetrics negative OB ROS                              Anesthesia Physical Anesthesia Plan  ASA: 2  Anesthesia Plan: General and General LMA   Post-op Pain Management:    Induction:   PONV Risk Score and Plan: Ondansetron   Airway Management Planned:   Additional Equipment:   Intra-op Plan:   Post-operative Plan:   Informed Consent: I have reviewed the patients History and Physical, chart, labs and discussed the procedure including the risks, benefits and alternatives for the proposed anesthesia with the patient or authorized representative who has indicated his/her understanding and acceptance.     Dental Advisory Given  Plan Discussed with: CRNA  Anesthesia Plan Comments:         Anesthesia Quick Evaluation

## 2024-05-12 ENCOUNTER — Other Ambulatory Visit: Payer: Self-pay | Admitting: Cardiology

## 2024-05-15 ENCOUNTER — Encounter

## 2024-05-21 ENCOUNTER — Ambulatory Visit: Attending: Cardiology | Admitting: *Deleted

## 2024-05-21 DIAGNOSIS — Z5181 Encounter for therapeutic drug level monitoring: Secondary | ICD-10-CM

## 2024-05-21 DIAGNOSIS — I4821 Permanent atrial fibrillation: Secondary | ICD-10-CM

## 2024-05-21 LAB — POCT INR: INR: 2.1 (ref 2.0–3.0)

## 2024-05-21 NOTE — Patient Instructions (Signed)
 Continue warfarin 1 tablet daily  Pending cystoscopy/with stent placement on 9/25.  Will hold warfarin 5 days before procedure.  Last dose will be 9/19.  Restart warfarin night of procedure if OK with MD Recheck 1 wk after procedure

## 2024-05-21 NOTE — Progress Notes (Signed)
 INR 2.1; Please see anticoagulation encounter

## 2024-05-22 DIAGNOSIS — I10 Essential (primary) hypertension: Secondary | ICD-10-CM | POA: Diagnosis not present

## 2024-05-22 DIAGNOSIS — E1122 Type 2 diabetes mellitus with diabetic chronic kidney disease: Secondary | ICD-10-CM | POA: Diagnosis not present

## 2024-05-23 ENCOUNTER — Encounter: Admitting: Urology

## 2024-05-29 ENCOUNTER — Encounter: Payer: Self-pay | Admitting: Cardiology

## 2024-05-29 ENCOUNTER — Ambulatory Visit: Attending: Cardiology | Admitting: Cardiology

## 2024-05-29 VITALS — BP 136/70 | HR 62 | Ht 70.0 in | Wt 207.2 lb

## 2024-05-29 DIAGNOSIS — I10 Essential (primary) hypertension: Secondary | ICD-10-CM | POA: Diagnosis not present

## 2024-05-29 DIAGNOSIS — E782 Mixed hyperlipidemia: Secondary | ICD-10-CM

## 2024-05-29 DIAGNOSIS — I4821 Permanent atrial fibrillation: Secondary | ICD-10-CM | POA: Diagnosis not present

## 2024-05-29 NOTE — Patient Instructions (Addendum)

## 2024-05-29 NOTE — Progress Notes (Signed)
    Cardiology Office Note  Date: 05/29/2024   ID: Daniel Schaefer, DOB [birth year], MRN [id number]  History of Present Illness: Daniel Schaefer is a 73 y.o. male last seen in December 2024.  He is here for a routine visit.  Reports no palpitations or chest pain.  Has had recent problems with nephrolithiasis, following with urology.  Medications reviewed.  He remains on Coumadin  with follow-up in the anticoagulation clinic.  No spontaneous bleeding problems reported.  Antihypertensive and lipid-lowering regimen are stable.  I reviewed his lab work, LDL was 65 in March.  Physical Exam: VS:  BP 136/70   Pulse 62   Ht 5' 10 (1.778 m)   Wt 207 lb 3.2 oz (94 kg)   SpO2 98%   BMI 29.73 kg/m , BMI Body mass index is 29.73 kg/m.  Wt Readings from Last 3 Encounters:  05/29/24 207 lb 3.2 oz (94 kg)  05/10/24 208 lb (94.3 kg)  03/27/24 212 lb 9.6 oz (96.4 kg)    General: Patient appears comfortable at rest. HEENT: Conjunctiva and lids normal. Neck: Supple, no elevated JVP or carotid bruits. Lungs: Clear to auscultation, nonlabored breathing at rest. Cardiac: Irregularly irregular without gallop.  ECG:  An ECG dated 11/29/2023 was personally reviewed today and demonstrated:  Atrial fibrillation with low voltage.  Labwork: 03/20/2024: ALT 11; AST 16; BUN 19; Creatinine, Ser 1.30; Potassium 4.7; Sodium 142     Component Value Date/Time   CHOL 130 11/17/2023 0820   TRIG 121 11/17/2023 0820   HDL 43 11/17/2023 0820   CHOLHDL 3.0 11/17/2023 0820   LDLCALC 65 11/17/2023 0820   Other Studies Reviewed Today:  No interval cardiac testing for review today.  Assessment and Plan:  1.  Permanent atrial fibrillation with CHA2DS2-VASc score of 3.  No palpitations and heart rate controlled at baseline in the absence of AV nodal blockers.  Continue Coumadin  for stroke prophylaxis with follow-up in anticoagulation clinic.   2.  Primary hypertension.  No change in current regimen.  He continues  on Benicar  40 mg daily and Norvasc  5 mg daily.   3.  Mixed hyperlipidemia.  LDL 65 in March.  Continue Pravachol  40 mg daily.  Disposition:  Follow up 6 months.  Signed, Jayson JUDITHANN Sierras, M.D., F.A.C.C. [facility] at [facility]

## 2024-06-01 NOTE — Patient Instructions (Signed)
 Daniel Schaefer  06/01/2024     @PREFPERIOPPHARMACY @   Your procedure is scheduled on 06/07/2024.   Report to [facility] at  1200 P.M.   Call this number if you have problems the morning of surgery:  [phone]  If you experience any cold or flu symptoms such as cough, fever, chills, shortness of breath, etc. between now and your scheduled surgery, please notify us  at the above number.   Remember:        Your last dose of coumadin  should be on 06/01/2024.            Your last dose of jardiance  should be on 06/03/2024.         Take 1/2 of your usual night time insulin .         DO NOT take any medications for diabetes the morning of your procedure.    Do not eat after midnight.   You may drink clear liquids until 1000 am on 06/07/2024.      Clear liquids allowed are:                    Water , Juice (No red color; non-citric and without pulp; diabetics please choose diet or no sugar options), Carbonated beverages (diabetics please choose diet or no sugar options), Clear Tea (No creamer, milk, or cream, including half & half and powdered creamer), Black Coffee Only (No creamer, milk or cream, including half & half and powdered creamer), and Clear Sports drink (No red color; diabetics please choose diet or no sugar options)    Take these medicines the morning of surgery with A SIP OF WATER                                amlodipine , omeprazole, tamsulosin .    Do not wear jewelry, make-up or nail polish, including gel polish,  artificial nails, or any other type of covering on natural nails (fingers and  toes).  Do not wear lotions, powders, or perfumes, or deodorant.  Do not shave 48 hours prior to surgery.  Men may shave face and neck.  Do not bring valuables to the hospital.  [facility] is not responsible for any belongings or valuables.  Contacts, dentures or bridgework may not be worn into surgery.  Leave your suitcase in the car.  After surgery it may be  brought to your room.  For patients admitted to the hospital, discharge time will be determined by your treatment team.  Patients discharged the day of surgery will not be allowed to drive home and must have someone with them for 24 hours.    Special instructions:   DO NOT smoke tobacco or vape for 24 hours before your procedure.  Please read over the following fact sheets that you were given. Coughing and Deep Breathing, Surgical Site Infection Prevention, Anesthesia Post-op Instructions, and Care and Recovery After Surgery      Ureteral Stent Implantation, Care After The following information offers guidance on how to care for yourself after your procedure. Your health care provider may also give you more specific instructions. If you have problems or questions, contact your health care provider. What can I expect after the procedure? After the procedure, it is common to have: Nausea. Mild pain when you urinate. You may feel this pain in your lower back or lower abdomen. The pain should stop within a few  minutes after you urinate. This pattern may last for up to 1 week. A small amount of blood in your urine for several days. Follow these instructions at home: Medicines Take over-the-counter and prescription medicines only as told by your health care provider. If you were prescribed antibiotics, take them as told by your health care provider. Do not stop using the antibiotic even if you start to feel better. If you were given a sedative during the procedure, it can affect you for several hours. Do not drive or operate machinery until your health care provider says that it is safe. Ask your health care provider if the medicine prescribed to you: Requires you to avoid driving or using machinery. Can cause constipation. You may need to take these actions to prevent or treat constipation: Take over-the-counter or prescription medicines. Eat foods that are high in fiber, such as beans,  whole grains, and fresh fruits and vegetables. Limit foods that are high in fat and processed sugars, such as fried or sweet foods. Activity Rest as told by your health care provider. Do not sit for a long time without moving. Get up to take short walks every 1-2 hours. This will improve blood flow and breathing. Ask for help if you feel weak or unsteady. Return to your normal activities as told by your health care provider. Ask your health care provider what activities are safe for you. General instructions  If you have a catheter: Follow instructions from your health care provider about taking care of your catheter and collection bag. Do not take baths, swim, or use a hot tub until your health care provider approves. Ask your health care provider if you may take showers. You may only be allowed to take sponge baths. Drink enough fluid to keep your urine pale yellow. Do not use any products that contain nicotine or tobacco. These products include cigarettes, chewing tobacco, and vaping devices, such as e-cigarettes. These can delay healing after surgery. If you need help quitting, ask your health care provider. Keep all follow-up visits. Contact a health care provider if: You start passing blood clots, or you have more than a small amount of blood in your urine. You have pain that gets worse or does not get better with medicine, especially pain when you urinate. You have trouble urinating. You feel nauseous or you vomit again and again during a period of more than 2 days after the procedure. You have a fever. Get help right away if: You are passing blood clots that are 1 inch (2.5 cm) or larger in size. You are leaking urine (have incontinence), or you cannot urinate. The end of the stent comes out of your urethra. You have sudden, sharp, or severe pain in your abdomen or lower back. You have swelling or pain in your legs. You have trouble breathing. These symptoms may be an emergency.  Get help right away. Call 911. Do not wait to see if the symptoms will go away. Do not drive yourself to the hospital. Summary After the procedure, it is common to have mild pain when you urinate that goes away within a few minutes after you urinate. This may last for up to 1 week. Take over-the-counter and prescription medicines only as told by your health care provider. Drink enough fluid to keep your urine pale yellow. Call your health care provider if you start passing blood clots, or you have more than a small amount of blood in your urine. This information is not intended  to replace advice given to you by your health care provider. Make sure you discuss any questions you have with your health care provider. Document Revised: 10/05/2021 Document Reviewed: 10/05/2021 Elsevier Patient Education  2024 [employer]General Anesthesia, Adult, Care After The following information offers guidance on how to care for yourself after your procedure. Your health care provider may also give you more specific instructions. If you have problems or questions, contact your health care provider. What can I expect after the procedure? After the procedure, it is common for people to: Have pain or discomfort at the IV site. Have nausea or vomiting. Have a sore throat or hoarseness. Have trouble concentrating. Feel cold or chills. Feel weak, sleepy, or tired (fatigue). Have soreness and body aches. These can affect parts of the body that were not involved in surgery. Follow these instructions at home: For the time period you were told by your health care provider:  Rest. Do not participate in activities where you could fall or become injured. Do not drive or use machinery. Do not drink alcohol. Do not take sleeping pills or medicines that cause drowsiness. Do not make important decisions or sign legal documents. Do not take care of children on your own. General instructions Drink enough fluid to keep  your urine pale yellow. If you have sleep apnea, surgery and certain medicines can increase your risk for breathing problems. Follow instructions from your health care provider about wearing your sleep device: Anytime you are sleeping, including during daytime naps. While taking prescription pain medicines, sleeping medicines, or medicines that make you drowsy. Return to your normal activities as told by your health care provider. Ask your health care provider what activities are safe for you. Take over-the-counter and prescription medicines only as told by your health care provider. Do not use any products that contain nicotine or tobacco. These products include cigarettes, chewing tobacco, and vaping devices, such as e-cigarettes. These can delay incision healing after surgery. If you need help quitting, ask your health care provider. Contact a health care provider if: You have nausea or vomiting that does not get better with medicine. You vomit every time you eat or drink. You have pain that does not get better with medicine. You cannot urinate or have bloody urine. You develop a skin rash. You have a fever. Get help right away if: You have trouble breathing. You have chest pain. You vomit blood. These symptoms may be an emergency. Get help right away. Call 911. Do not wait to see if the symptoms will go away. Do not drive yourself to the hospital. Summary After the procedure, it is common to have a sore throat, hoarseness, nausea, vomiting, or to feel weak, sleepy, or fatigue. For the time period you were told by your health care provider, do not drive or use machinery. Get help right away if you have difficulty breathing, have chest pain, or vomit blood. These symptoms may be an emergency. This information is not intended to replace advice given to you by your health care provider. Make sure you discuss any questions you have with your health care provider. Document Revised: 11/27/2021  Document Reviewed: 11/27/2021 Elsevier Patient Education  2024 [employer].How to Use Chlorhexidine  at Home in the Shower Chlorhexidine  gluconate (CHG) is a germ-killing (antiseptic) wash that's used to clean the skin. It can get rid of the germs that normally live on the skin and can keep them away for about 24 hours. If you're having surgery, you may be  told to shower with CHG at home the night before surgery. This can help lower your risk for infection. To use CHG wash in the shower, follow the steps below. Supplies needed: CHG body wash. Clean washcloth. Clean towel. How to use CHG in the shower Follow these steps unless you're told to use CHG in a different way: Start the shower. Use your normal soap and shampoo to wash your face and hair. Turn off the shower or move out of the shower stream. Pour CHG onto a clean washcloth. Do not use any type of brush or rough sponge. Start at your neck, washing your body down to your toes. Make sure you: Wash the part of your body where the surgery will be done for at least 1 minute. Do not scrub. Do not use CHG on your head or face unless your health care provider tells you to. If it gets into your ears or eyes, rinse them well with water . Do not wash your genitals with CHG. Wash your back and under your arms. Make sure to wash skin folds. Let the CHG sit on your skin for 1-2 minutes or as long as told. Rinse your entire body in the shower, including all body creases and folds. Turn off the shower. Dry off with a clean towel. Do not put anything on your skin afterward, such as powder, lotion, or perfume. Put on clean clothes or pajamas. If it's the night before surgery, sleep in clean sheets. General tips Use CHG only as told, and follow the instructions on the label. Use the full amount of CHG as told. This is often one bottle. Do not smoke and stay away from flames after using CHG. Your skin may feel sticky after using CHG. This is normal.  The sticky feeling will go away as the CHG dries. Do not use CHG: If you have a chlorhexidine  allergy or have reacted to chlorhexidine  in the past. On open wounds or areas of skin that have broken skin, cuts, or scrapes. On babies younger than 63 months of age. Contact a health care provider if: You have questions about using CHG. Your skin gets irritated or itchy. You have a rash after using CHG. You swallow any CHG. Call your local poison control center [phone] in the [place] Your eyes itch badly, or they become very red or swollen. Your hearing changes. You have trouble seeing. If you can't reach your provider, go to an urgent care or emergency room. Do not drive yourself. Get help right away if: You have swelling or tingling in your mouth or throat. You make high-pitched whistling sounds when you breathe, most often when you breathe out (wheeze). You have trouble breathing. These symptoms may be an emergency. Call 911 right away. Do not wait to see if the symptoms will go away. Do not drive yourself to the hospital. This information is not intended to replace advice given to you by your health care provider. Make sure you discuss any questions you have with your health care provider. Document Revised: 03/15/2023 Document Reviewed: 03/11/2022 Elsevier Patient Education  2024 [employer].

## 2024-06-04 ENCOUNTER — Encounter (HOSPITAL_COMMUNITY)
Admission: RE | Admit: 2024-06-04 | Discharge: 2024-06-04 | Disposition: A | Discharge status: Home / Self Care | Source: Ambulatory Visit | Attending: Urology | Admitting: Urology

## 2024-06-04 ENCOUNTER — Encounter (HOSPITAL_COMMUNITY): Payer: Self-pay

## 2024-06-04 VITALS — BP 136/70 | HR 62 | Resp 18 | Ht 70.0 in | Wt 207.2 lb

## 2024-06-04 DIAGNOSIS — Z01812 Encounter for preprocedural laboratory examination: Secondary | ICD-10-CM | POA: Diagnosis not present

## 2024-06-04 DIAGNOSIS — E1151 Type 2 diabetes mellitus with diabetic peripheral angiopathy without gangrene: Secondary | ICD-10-CM | POA: Diagnosis not present

## 2024-06-04 DIAGNOSIS — Z01818 Encounter for other preprocedural examination: Secondary | ICD-10-CM | POA: Diagnosis present

## 2024-06-04 HISTORY — DX: Chronic kidney disease, unspecified: N18.9

## 2024-06-04 HISTORY — DX: Heart failure, unspecified: I50.9

## 2024-06-04 LAB — BASIC METABOLIC PANEL WITH GFR
Anion gap: 8 (ref 5–15)
BUN: 24 mg/dL — ABNORMAL HIGH (ref 8–23)
CO2: 23 mmol/L (ref 22–32)
Calcium: 7.8 mg/dL — ABNORMAL LOW (ref 8.9–10.3)
Chloride: 105 mmol/L (ref 98–111)
Creatinine, Ser: 1.43 mg/dL — ABNORMAL HIGH (ref 0.61–1.24)
GFR, Estimated: 51 mL/min — ABNORMAL LOW (ref >60)
Glucose, Bld: 182 mg/dL — ABNORMAL HIGH (ref 70–99)
Potassium: 4.2 mmol/L (ref 3.5–5.1)
Sodium: 136 mmol/L (ref 135–145)

## 2024-06-04 LAB — HEMOGLOBIN A1C
Hgb A1c MFr Bld: 6.7 % — ABNORMAL HIGH (ref 4.8–5.6)
Mean Plasma Glucose: 145.59 mg/dL

## 2024-06-04 MED ORDER — ORAL CARE MOUTH RINSE: 15.0000 mL | Freq: Once | OROMUCOSAL | Status: DC

## 2024-06-04 MED ORDER — CHLORHEXIDINE GLUCONATE 0.12 % MT SOLN: 15.0000 mL | Freq: Once | OROMUCOSAL | Status: DC

## 2024-06-04 MED ORDER — LACTATED RINGERS IV SOLN: INTRAVENOUS | Status: DC

## 2024-06-05 ENCOUNTER — Other Ambulatory Visit: Payer: Self-pay | Admitting: "Endocrinology

## 2024-06-05 DIAGNOSIS — E1159 Type 2 diabetes mellitus with other circulatory complications: Secondary | ICD-10-CM

## 2024-06-07 ENCOUNTER — Ambulatory Visit (HOSPITAL_COMMUNITY)
Admission: RE | Admit: 2024-06-07 | Discharge: 2024-06-07 | Disposition: A | Discharge status: Home / Self Care | Attending: Urology | Admitting: Urology

## 2024-06-07 ENCOUNTER — Ambulatory Visit (HOSPITAL_COMMUNITY)

## 2024-06-07 ENCOUNTER — Encounter (HOSPITAL_COMMUNITY)
Admission: RE | Disposition: A | Discharge status: Home / Self Care | Payer: Self-pay | Source: Home / Self Care | Attending: Urology

## 2024-06-07 ENCOUNTER — Ambulatory Visit (HOSPITAL_BASED_OUTPATIENT_CLINIC_OR_DEPARTMENT_OTHER): Admitting: Certified Registered"

## 2024-06-07 ENCOUNTER — Ambulatory Visit (HOSPITAL_COMMUNITY): Admitting: Certified Registered"

## 2024-06-07 ENCOUNTER — Other Ambulatory Visit: Payer: Self-pay

## 2024-06-07 ENCOUNTER — Encounter (HOSPITAL_COMMUNITY): Payer: Self-pay | Admitting: Urology

## 2024-06-07 DIAGNOSIS — N200 Calculus of kidney: Secondary | ICD-10-CM | POA: Diagnosis not present

## 2024-06-07 DIAGNOSIS — Z7984 Long term (current) use of oral hypoglycemic drugs: Secondary | ICD-10-CM | POA: Diagnosis not present

## 2024-06-07 DIAGNOSIS — R3912 Poor urinary stream: Secondary | ICD-10-CM | POA: Insufficient documentation

## 2024-06-07 DIAGNOSIS — Z794 Long term (current) use of insulin: Secondary | ICD-10-CM | POA: Insufficient documentation

## 2024-06-07 DIAGNOSIS — Z79899 Other long term (current) drug therapy: Secondary | ICD-10-CM | POA: Diagnosis not present

## 2024-06-07 DIAGNOSIS — I10 Essential (primary) hypertension: Secondary | ICD-10-CM

## 2024-06-07 DIAGNOSIS — N138 Other obstructive and reflux uropathy: Secondary | ICD-10-CM | POA: Insufficient documentation

## 2024-06-07 DIAGNOSIS — N401 Enlarged prostate with lower urinary tract symptoms: Secondary | ICD-10-CM | POA: Diagnosis not present

## 2024-06-07 DIAGNOSIS — Z466 Encounter for fitting and adjustment of urinary device: Secondary | ICD-10-CM | POA: Diagnosis not present

## 2024-06-07 DIAGNOSIS — E1151 Type 2 diabetes mellitus with diabetic peripheral angiopathy without gangrene: Secondary | ICD-10-CM | POA: Insufficient documentation

## 2024-06-07 DIAGNOSIS — I4891 Unspecified atrial fibrillation: Secondary | ICD-10-CM | POA: Insufficient documentation

## 2024-06-07 DIAGNOSIS — N320 Bladder-neck obstruction: Secondary | ICD-10-CM | POA: Insufficient documentation

## 2024-06-07 DIAGNOSIS — Z7901 Long term (current) use of anticoagulants: Secondary | ICD-10-CM | POA: Insufficient documentation

## 2024-06-07 DIAGNOSIS — N5201 Erectile dysfunction due to arterial insufficiency: Secondary | ICD-10-CM | POA: Insufficient documentation

## 2024-06-07 DIAGNOSIS — Z8546 Personal history of malignant neoplasm of prostate: Secondary | ICD-10-CM | POA: Insufficient documentation

## 2024-06-07 HISTORY — PX: CYSTOSCOPY/URETEROSCOPY/HOLMIUM LASER/STENT PLACEMENT: SHX6546

## 2024-06-07 HISTORY — PX: HOLMIUM LASER APPLICATION: SHX5852

## 2024-06-07 HISTORY — PX: CYSTOSCOPY/RETROGRADE/URETEROSCOPY/STONE EXTRACTION WITH BASKET: SHX5317

## 2024-06-07 LAB — GLUCOSE, CAPILLARY: Glucose-Capillary: 87 mg/dL (ref 70–99)

## 2024-06-07 SURGERY — CYSTOSCOPY/URETEROSCOPY/HOLMIUM LASER/STENT PLACEMENT
Anesthesia: General | Site: Renal | Laterality: Left

## 2024-06-07 MED ORDER — WATER FOR IRRIGATION, STERILE IR SOLN
Status: DC | PRN
  Administered 2024-06-07: 500 mL via SURGICAL_CAVITY

## 2024-06-07 MED ORDER — FENTANYL CITRATE (PF) 100 MCG/2ML IJ SOLN
INTRAMUSCULAR | Status: DC | PRN
  Administered 2024-06-07: 25 ug via INTRAVENOUS

## 2024-06-07 MED ORDER — ONDANSETRON HCL 4 MG PO TABS: 4.0000 mg | ORAL_TABLET | Freq: Every day | ORAL | 1 refills | Status: AC | PRN

## 2024-06-07 MED ORDER — SODIUM CHLORIDE 0.9 % IR SOLN
Status: DC | PRN
  Administered 2024-06-07: 3000 mL

## 2024-06-07 MED ORDER — LIDOCAINE 2% (20 MG/ML) 5 ML SYRINGE
INTRAMUSCULAR | Status: AC
  Filled 2024-06-07: qty 5

## 2024-06-07 MED ORDER — ONDANSETRON HCL 4 MG/2ML IJ SOLN: 4.0000 mg | Freq: Once | INTRAMUSCULAR | Status: DC | PRN

## 2024-06-07 MED ORDER — ONDANSETRON HCL 4 MG/2ML IJ SOLN
INTRAMUSCULAR | Status: AC
  Filled 2024-06-07: qty 4

## 2024-06-07 MED ORDER — CHLORHEXIDINE GLUCONATE 0.12 % MT SOLN
OROMUCOSAL | Status: AC
  Filled 2024-06-07: qty 15

## 2024-06-07 MED ORDER — FENTANYL CITRATE (PF) 100 MCG/2ML IJ SOLN
INTRAMUSCULAR | Status: AC
  Filled 2024-06-07: qty 2

## 2024-06-07 MED ORDER — OXYCODONE-ACETAMINOPHEN 5-325 MG PO TABS: 1.0000 | ORAL_TABLET | ORAL | 0 refills | Status: AC | PRN

## 2024-06-07 MED ORDER — CEFAZOLIN SODIUM-DEXTROSE 2-4 GM/100ML-% IV SOLN
INTRAVENOUS | Status: AC
  Filled 2024-06-07: qty 100

## 2024-06-07 MED ORDER — ORAL CARE MOUTH RINSE: 15.0000 mL | Freq: Once | OROMUCOSAL | Status: DC

## 2024-06-07 MED ORDER — CHLORHEXIDINE GLUCONATE 0.12 % MT SOLN: 15.0000 mL | Freq: Once | OROMUCOSAL | Status: DC

## 2024-06-07 MED ORDER — LIDOCAINE 2% (20 MG/ML) 5 ML SYRINGE
INTRAMUSCULAR | Status: DC | PRN
Start: 2024-06-07 — End: 2024-06-07
  Administered 2024-06-07: 60 mg via INTRAVENOUS

## 2024-06-07 MED ORDER — PROPOFOL 500 MG/50ML IV EMUL
INTRAVENOUS | Status: DC | PRN
  Administered 2024-06-07: 170 mg via INTRAVENOUS

## 2024-06-07 MED ORDER — ONDANSETRON HCL 4 MG/2ML IJ SOLN
INTRAMUSCULAR | Status: DC | PRN
  Administered 2024-06-07: 4 mg via INTRAVENOUS

## 2024-06-07 MED ORDER — LACTATED RINGERS IV SOLN: INTRAVENOUS | Status: DC

## 2024-06-07 MED ORDER — DIATRIZOATE MEGLUMINE 30 % UR SOLN
URETHRAL | Status: AC
  Filled 2024-06-07: qty 100

## 2024-06-07 MED ORDER — TAMSULOSIN HCL 0.4 MG PO CAPS: 0.4000 mg | ORAL_CAPSULE | Freq: Every day | ORAL | 0 refills | Status: DC

## 2024-06-07 MED ORDER — FENTANYL CITRATE PF 50 MCG/ML IJ SOSY: 25.0000 ug | PREFILLED_SYRINGE | INTRAMUSCULAR | Status: DC | PRN

## 2024-06-07 MED ORDER — DIATRIZOATE MEGLUMINE 30 % UR SOLN
URETHRAL | Status: DC | PRN
  Administered 2024-06-07: 7 mL via URETHRAL

## 2024-06-07 MED ORDER — OXYCODONE HCL 5 MG PO TABS: 5.0000 mg | ORAL_TABLET | Freq: Once | ORAL | Status: DC | PRN

## 2024-06-07 MED ORDER — OXYCODONE HCL 5 MG/5ML PO SOLN: 5.0000 mg | Freq: Once | ORAL | Status: DC | PRN

## 2024-06-07 MED ORDER — CEFAZOLIN SODIUM-DEXTROSE 2-4 GM/100ML-% IV SOLN
2.0000 g | INTRAVENOUS | Status: AC
  Administered 2024-06-07: 2 g via INTRAVENOUS

## 2024-06-07 MED ORDER — GLYCOPYRROLATE PF 0.2 MG/ML IJ SOSY
PREFILLED_SYRINGE | INTRAMUSCULAR | Status: DC | PRN
  Administered 2024-06-07: .2 mg via INTRAVENOUS

## 2024-06-07 SURGICAL SUPPLY — 24 items
BAG DRAIN URO TABLE W/ADPT NS (BAG) ×1 IMPLANT
BAG HAMPER (MISCELLANEOUS) ×1 IMPLANT
BASKET NITINOL 4 WIRE 16 (BASKET) ×1 IMPLANT
CATH URETL OPEN END 6FR 70 (CATHETERS) ×1 IMPLANT
CLOTH BEACON ORANGE TIMEOUT ST (SAFETY) ×1 IMPLANT
EXTRACTOR STONE NITINOL NGAGE (UROLOGICAL SUPPLIES) ×1 IMPLANT
GLOVE BIO SURGEON STRL SZ8 (GLOVE) ×1 IMPLANT
GLOVE BIOGEL PI IND STRL 7.0 (GLOVE) ×2 IMPLANT
GOWN STRL REUS W/TWL LRG LVL3 (GOWN DISPOSABLE) ×1 IMPLANT
GOWN STRL REUS W/TWL XL LVL3 (GOWN DISPOSABLE) ×1 IMPLANT
GUIDEWIRE STR DUAL SENSOR (WIRE) ×1 IMPLANT
GUIDEWIRE STR ZIPWIRE 035X150 (MISCELLANEOUS) ×1 IMPLANT
KIT TURNOVER CYSTO (KITS) ×1 IMPLANT
MANIFOLD NEPTUNE II (INSTRUMENTS) ×1 IMPLANT
PACK CYSTO (CUSTOM PROCEDURE TRAY) ×1 IMPLANT
PAD ARMBOARD POSITIONER FOAM (MISCELLANEOUS) ×1 IMPLANT
POSITIONER HEAD 8X9X4 ADT (SOFTGOODS) ×1 IMPLANT
SHEATH NAVIGATOR HD 11/13X36 (SHEATH) ×1 IMPLANT
SOL .9 NS 3000ML IRR UROMATIC (IV SOLUTION) ×2 IMPLANT
STENT URET 6FRX26 CONTOUR (STENTS) IMPLANT
SYR 10ML LL (SYRINGE) ×1 IMPLANT
TOWEL OR 17X26 4PK STRL BLUE (TOWEL DISPOSABLE) ×1 IMPLANT
TRACTIP FLEXIVA PULS ID 200XHI (Laser) IMPLANT
WATER STERILE IRR 500ML POUR (IV SOLUTION) ×1 IMPLANT

## 2024-06-07 NOTE — Op Note (Signed)
.  Preoperative diagnosis: Left renal stones  Postoperative diagnosis: Same  Procedure: 1 cystoscopy 2. Left retrograde pyelography 3.  Intraoperative fluoroscopy, under one hour, with interpretation 4.  Left ureteroscopic stone manipulation with laser lithotripsy 5.  Left 6 x 26 JJ stent placement  Attending: Belvie Standing  Anesthesia: General  Estimated blood loss: None  Drains: Left 6 x 26 JJ ureteral stent with tether  Specimens: stone for analysis  Antibiotics: ancef   Findings: left lower pole stones. No hydronephrosis. No masses/lesions in the bladder. Ureteral orifices in normal anatomic location.  Indications: Patient is a 73 year old male with a history of left renal stone and who has persistent left flank pain.  After discussing treatment options, he decided proceed with left ureteroscopic stone manipulation.  Procedure in detail: The patient was brought to the operating room and a brief timeout was done to ensure correct patient, correct procedure, correct site.  General anesthesia was administered patient was placed in dorsal lithotomy position.  His genitalia was then prepped and draped in usual sterile fashion.  A rigid 22 French cystoscope was passed in the urethra and the bladder.  Bladder was inspected free masses or lesions.  the ureteral orifices were in the normal orthotopic locations.  a 6 french ureteral catheter was then instilled into the left ureteral orifice.  a gentle retrograde was obtained and findings noted above. Using a grasper the left ureteral stent was brought to the urethral meatus.  we then placed a zip wire through the ureteral stent and advanced up to the renal pelvis. We then removed the stent.  we then removed the cystoscope and cannulated the left ureteral orifice with a semirigid ureteroscope.  No stone was found in the ureter. Once we reached the UPJ a sensor wire was advanced in to the renal pelvis. We then removed the ureteroscope and advanced  am 12/14 x 35cm access sheath up to the renal pelvis. We then used the flexible ureteroscope to perform nephroscopy. We encountered the stone in the lower pole which was fragmented with a 242nm laser fiber.    the fragments were then removed with a Ngage basket.    once all stone fragments were removed we then removed the access sheath under direct vision and noted no injury to the ureter. We then placed a 6 x 26 double-j ureteral stent over the original zip wire.  We then removed the wire and good coil was noted in the the renal pelvis under fluoroscopy and the bladder under direct vision. the bladder was then drained and this concluded the procedure which was well tolerated by patient.  Complications: None  Condition: Stable, extubated, transferred to PACU  Plan: Patient is to be discharged home as to follow-up in one week. He is to remove his stent in 72 hours by pulling the tether

## 2024-06-07 NOTE — Progress Notes (Incomplete)
 Spoke with Dr. Sherrilee regarding when pt can restart coumadin  and states pt can restart tomorrow.  Spoke with patient and made aware.

## 2024-06-07 NOTE — H&P (Signed)
 History of Present Illness: Daniel Schaefer is a 73 y.o. male who presents today for follow up visit at [facility] [place]. Relevant History includes: 1. Prostate cancer.  - Treated with EXRT and brachytherapy seed implants in 2003.  2. Prostatomegaly with LUTS (nocturia, occasional urgency, rare urge incontinence, weak stream). 3. Kidney stones. 4. Hypogonadism. 5. Erectile dysfunction. Previously had Tadalafil.   At last visit with Dr. Watt on 03/24/2023: - He didn't go for the KUB ordered for prior to this visit for the 12mm LLP stone but he had a CT in 1/24 that showed a stable 12mm LLP stone and a 7mm RLP stone. He has a history of ED and has tadalafil but doesn't use it. He had a left inguinal hernia repair last year and he has had some penile retraction since. He has hypogonadism with the last testosterone  level at 165 in 7/23. He is on tamsulosin  for LUTS. - The plan was: 1. History of prostate cancer.  PSA is stable.  Return in a year.  2. BPH with BOO and mild LUTS.   I refilled the tamsulosin . 3. Testicular atrophy.  His T level is 165.  I discussed TRT but he would like to hold off. 4. ED.  He hasn't been using the tadalafil and hasn't been active.  5. Renal stone.  He has stable 12mm LLP stone or stone cluster and smaller RLP stone.  I will reassess with a KUB in 1 year.   Since last visit: > 03/29/2024:  - PSA stable (0.3). - KUB showed multiple bilateral intrarenal calculi. Largest stone on the left measures 18 mm. Largest stone on the right measures 3 mm.    Today: He reports nocturia x2-3. Denies urgency, dysuria, gross hematuria, weak urinary stream, hesitancy, straining to void, or sensations of incomplete emptying. Denies flank pain or abdominal pain. He is bothered by his urinary frequency, particularly when at church. He denies being sexually active.   Medications:       Current Outpatient Medications  Medication Sig Dispense Refill   albuterol  (VENTOLIN HFA) 108 (90 Base) MCG/ACT inhaler Inhale into the lungs.       amLODipine  (NORVASC ) 5 MG tablet Take 1 tablet by mouth once daily 90 tablet 1   Azelastine HCl 137 MCG/SPRAY SOLN Place 2 sprays into both nostrils daily.       Blood Glucose Monitoring Suppl (ACCU-CHEK GUIDE ME) w/Device KIT 1 Piece by Does not apply route as directed. 1 kit 0   Cholecalciferol (VITAMIN D3) 125 MCG (5000 UT) TABS Take 5,000 Units by mouth daily.        empagliflozin  (JARDIANCE ) 10 MG TABS tablet Take 1 tablet (10 mg total) by mouth daily with breakfast. 90 tablet 1   Flaxseed, Linseed, (FLAX SEEDS PO) Take by mouth.       furosemide  (LASIX ) 20 MG tablet Take 1 tablet (20 mg total) by mouth daily. Please call [phone] to schedule an appointment for future refills. Thank you. 90 tablet 1   GLOBAL EASE INJECT PEN NEEDLES 31G X 8 MM MISC USE 1 DAILY AS DIRECTED - RUN ON CASH $15 - PER PC. 100 each 5   glucose blood (ACCU-CHEK GUIDE TEST) test strip Use to check blood glucose twice daily as directed. 100 each 2   Green Tea, Camellia sinensis, (GREEN TEA EXTRACT PO) Take 500 mg by mouth daily.       insulin  glargine, 2 Unit Dial, (TOUJEO  MAX SOLOSTAR) 300 UNIT/ML Solostar Pen Inject  52 Units into the skin at bedtime. 15 mL 1   Insulin  Pen Needle (B-D ULTRAFINE III SHORT PEN) 31G X 8 MM MISC 1 each by Does not apply route as directed. 50 each 2   Lancets MISC 1 each by Does not apply route as directed. 100 each 3   levocetirizine (XYZAL) 5 MG tablet SMARTSIG:1 Tablet(s) By Mouth Every Evening       melatonin 5 MG TABS Take 5 mg by mouth.       metFORMIN  (GLUCOPHAGE ) 500 MG tablet Take 1 tablet by mouth once daily with breakfast 90 tablet 0   olmesartan  (BENICAR ) 40 MG tablet Take 1 tablet (40 mg total) by mouth daily. REFILLS TO PCP 30 tablet 0   Omega-3 Fat Ac-Cholecalciferol (SUPERIOR OMEGA3 W/ VITAMIN D  PO) Take by mouth daily.       omeprazole (PRILOSEC) 20 MG capsule Take 20 mg by mouth daily.         oxymetazoline (AFRIN) 0.05 % nasal spray Place 1 spray into both nostrils 2 (two) times daily as needed for congestion.       potassium chloride  (KLOR-CON ) 10 MEQ tablet TAKE 1 TABLET BY MOUTH DAILY 100 tablet 2   pravastatin  (PRAVACHOL ) 40 MG tablet TAKE 1 TABLET BY MOUTH AT BEDTIME 90 tablet 1   Selenium 200 MCG CAPS Take 1 capsule by mouth daily.       tamsulosin  (FLOMAX ) 0.4 MG CAPS capsule Take 1 capsule by mouth once daily 90 capsule 0   warfarin (COUMADIN ) 5 MG tablet TAKE 1 TABLET BY MOUTH ONCE DAILY EXCEPT TAKE 1/2 TABLET ON WEDNESDAYS OR AS DIRECTED 35 tablet 5   Zinc 50 MG TABS Take by mouth.          No current facility-administered medications for this visit.        Allergies: Allergies       Allergies  Allergen Reactions   Bactrim [Sulfamethoxazole-Trimethoprim] Other (See Comments)      Thin his blood really bad   Other        Cannot mix warfarin and bactrim    Tresiba  Flextouch [Insulin  Degludec] Rash            Past Medical History:  Diagnosis Date   Atrial fibrillation [facility])      Reportedly diagnosed November 2011   Essential hypertension     History of kidney stones     Prostate cancer [facility])      Radiation implants   Type 2 diabetes mellitus (HCC)               Past Surgical History:  Procedure Laterality Date   COLONOSCOPY WITH PROPOFOL  N/A 12/02/2023    Procedure: COLONOSCOPY WITH PROPOFOL ;  Surgeon: Cinderella Deatrice FALCON, MD;  Location: [facility];  Service: Endoscopy;  Laterality: N/A;  7:30am;asa 3   Gold seed placement       HEMOSTASIS CLIP PLACEMENT   12/02/2023    Procedure: CONTROL OF HEMORRHAGE, GI TRACT, ENDOSCOPIC, BY CLIPPING OR OVERSEWING;  Surgeon: Cinderella Deatrice FALCON, MD;  Location: [facility];  Service: Endoscopy;;   INGUINAL HERNIA REPAIR Left 07/02/2020    Procedure: LEFT INGUINAL HERNIORRHAPY WITH MESH;  Surgeon: Mavis Anes, MD;  Location: [facility];  Service: General;  Laterality: Left;   LITHOTRIPSY       POLYPECTOMY   12/02/2023     Procedure: POLYPECTOMY;  Surgeon: Cinderella Deatrice FALCON, MD;  Location: [facility];  Service: Endoscopy;;   PROSTATE BIOPSY  TRIGGER FINGER RELEASE Left      ring finger   URETEROSCOPY WITH HOLMIUM LASER LITHOTRIPSY                 Family History  Problem Relation Age of Onset   Cancer Father 84        Laryngeal   Lymphoma Mother 3        Social History         Socioeconomic History   Marital status: Divorced      Spouse name: Not on file   Number of children: Not on file   Years of education: Not on file   Highest education level: Not on file  Occupational History   Not on file  Tobacco Use   Smoking status: Never   Smokeless tobacco: Never  Vaping Use   Vaping status: Never Used  Substance and Sexual Activity   Alcohol use: No      Alcohol/week: 0.0 standard drinks of alcohol   Drug use: No   Sexual activity: Yes  Other Topics Concern   Not on file  Social History Narrative   Not on file    Social Drivers of Health    Financial Resource Strain: Not on file  Food Insecurity: Not on file  Transportation Needs: Not on file  Physical Activity: Not on file  Stress: Not on file  Social Connections: Not on file  Intimate Partner Violence: Not on file      SUBJECTIVE   Review of Systems Constitutional: Patient denies any unintentional weight loss or change in strength lntegumentary: Patient denies any rashes or pruritus Cardiovascular: Patient denies chest pain or syncope Respiratory: Patient denies shortness of breath Gastrointestinal: Patient denies nausea, vomiting, constipation, or diarrhea  Musculoskeletal: Patient denies muscle cramps or weakness Neurologic: Patient denies convulsions or seizures Allergic/Immunologic: Patient denies recent allergic reaction(s) Hematologic/Lymphatic: Patient denies bleeding tendencies Endocrine: Patient denies heat/cold intolerance   GU: As per HPI.   OBJECTIVE    Vitals:    04/05/24 1343  BP: (!)  163/93  Pulse: 76    There is no height or weight on file to calculate BMI.   Physical Examination Constitutional: No obvious distress; patient is non-toxic appearing  Cardiovascular: No visible lower extremity edema.  Respiratory: The patient does not have audible wheezing/stridor; respirations do not appear labored  Gastrointestinal: Abdomen non-distended Musculoskeletal: Normal ROM of UEs  Skin: No obvious rashes/open sores  Neurologic: CN 2-12 grossly intact Psychiatric: Answered questions appropriately with normal affect  Hematologic/Lymphatic/Immunologic: No obvious bruises or sites of spontaneous bleeding   UA: glucosuria (secondary to Jardiance  use), otherwise unremarkable PVR: 240 ml (patient stated he hadn't emptied completely when he voided; rushed)   ASSESSMENT History of adenocarcinoma of prostate - Plan: Urinalysis, Routine w reflex microscopic   BPH with urinary obstruction - Plan: BLADDER SCAN AMB NON-IMAGING   Renal stones   Hypogonadism in male   Erectile dysfunction due to arterial insufficiency   Weak urinary stream   For bothersome urinary frequency we discussed option for him to shop online or at medical supply store(s) for external male catheter as desired (no prescription needed); showed him some examples online. He verbalized understanding that his urinary frequency exacerbated by his Lasix  and Jardiance  use.    We reviewed recent imaging results, in particular that his left stone burden has increased compared to prior and that proactive intervention is advised to prevent risk for developing hydronephrosis, kidney damage, etc. We discussed the various treatment options  including extracorporeal shock wave lithotripsy (ESWL) and ureteroscopic stone manipulation (URS). We discussed possible risks and benefits of intervention including but not limited to: including pain, infection, sepsis, UTI, ureter perforation, need for stenting, post-op ureteral stricture,  hematuria, possible need for follow up procedures.  Patient verbalized preference for ureteroscopic stone manipulation due to outpatient procedure copay and lower risk for needing a secondary procedure; will consult with Dr. Sherrilee and notify patient of his surgical recommendation.   For LUTS he plans to continue Flomax  (Tamsulosin ) 0.4 mg daily; refills are up to date.   Patient verbalized understanding of and agreement with current plan. All questions were answered.   PLAN -We discussed the management of kidney stones. These options include observation, ureteroscopy, shockwave lithotripsy (ESWL) and percutaneous nephrolithotomy (PCNL). We discussed which options are relevant to the patient's stone(s). We discussed the natural history of kidney stones as well as the complications of untreated stones and the impact on quality of life without treatment as well as with each of the above listed treatments. We also discussed the efficacy of each treatment in its ability to clear the stone burden. With any of these management options I discussed the signs and symptoms of infection and the need for emergent treatment should these be experienced. For each option we discussed the ability of each procedure to clear the patient of their stone burden.   For observation I described the risks which include but are not limited to silent renal damage, life-threatening infection, need for emergent surgery, failure to pass stone and pain.   For ureteroscopy I described the risks which include bleeding, infection, damage to contiguous structures, positioning injury, ureteral stricture, ureteral avulsion, ureteral injury, need for prolonged ureteral stent, inability to perform ureteroscopy, need for an interval procedure, inability to clear stone burden, stent discomfort/pain, heart attack, stroke, pulmonary embolus and the inherent risks with general anesthesia.   For shockwave lithotripsy I described the risks which  include arrhythmia, kidney contusion, kidney hemorrhage, need for transfusion, pain, inability to adequately break up stone, inability to pass stone fragments, Steinstrasse, infection associated with obstructing stones, need for alternate surgical procedure, need for repeat shockwave lithotripsy, MI, CVA, PE and the inherent risks with anesthesia/conscious sedation.   For PCNL I described the risks including positioning injury, pneumothorax, hydrothorax, need for chest tube, inability to clear stone burden, renal laceration, arterial venous fistula or malformation, need for embolization of kidney, loss of kidney or renal function, need for repeat procedure, need for prolonged nephrostomy tube, ureteral avulsion, MI, CVA, PE and the inherent risks of general anesthesia.   - The patient would like to proceed with left ureteroscopic stone extraction

## 2024-06-07 NOTE — Anesthesia Procedure Notes (Signed)
 Procedure Name: LMA Insertion Date/Time: 06/07/2024 11:14 AM  Performed by: Para Jerelene CROME, CRNAPre-anesthesia Checklist: Patient identified, Emergency Drugs available, Suction available and Patient being monitored Patient Re-evaluated:Patient Re-evaluated prior to induction Oxygen Delivery Method: Circle system utilized Preoxygenation: Pre-oxygenation with 100% oxygen Induction Type: IV induction LMA: LMA inserted LMA Size: 4.0 Number of attempts: 1 Placement Confirmation: positive ETCO2, CO2 detector and breath sounds checked- equal and bilateral ETT to lip (cm): No marking on Ambu Aurastraight LMA size 4. Tube secured with: Tape Dental Injury: Teeth and Oropharynx as per pre-operative assessment  Comments: Atraumatic insertion of LMA size 4. Lips and teeth remain in preoperative condition.

## 2024-06-07 NOTE — Transfer of Care (Signed)
 Immediate Anesthesia Transfer of Care Note  Patient: Daniel Schaefer  Procedure(s) Performed: CYSTOSCOPY/URETEROSCOPY/HOLMIUM LASER/STENT EXCHANGE (Left: Renal) HOLMIUM LASER APPLICATION (Left: Renal) CYSTOSCOPY, WITH CALCULUS REMOVAL USING BASKET (Left: Renal)  Patient Location: PACU  Anesthesia Type:General  Level of Consciousness: drowsy  Airway & Oxygen Therapy: Patient Spontanous Breathing and Patient connected to nasal cannula oxygen  Post-op Assessment: Report given to RN and Post -op Vital signs reviewed and stable  Post vital signs: Reviewed and stable  Last Vitals:  Vitals Value Taken Time  BP 109/78   Temp 98   Pulse 60   Resp 12   SpO2 100%     Last Pain:  Vitals:   06/07/24 1020  PainSc: 0-No pain         Complications: No notable events documented.

## 2024-06-07 NOTE — Anesthesia Preprocedure Evaluation (Signed)
 Anesthesia Evaluation  Patient identified by MRN, date of birth, ID band Patient awake    Reviewed: Allergy & Precautions, H&P , NPO status , Patient's Chart, lab work & pertinent test results, reviewed documented beta blocker date and time   Airway Mallampati: II  TM Distance: >3 FB Neck ROM: full    Dental no notable dental hx.    Pulmonary shortness of breath   Pulmonary exam normal breath sounds clear to auscultation       Cardiovascular Exercise Tolerance: Good hypertension, + Peripheral Vascular Disease  + dysrhythmias Atrial Fibrillation  Rhythm:regular Rate:Normal     Neuro/Psych negative neurological ROS  negative psych ROS   GI/Hepatic negative GI ROS, Neg liver ROS,,,  Endo/Other  diabetes    Renal/GU Renal disease  negative genitourinary   Musculoskeletal   Abdominal   Peds  Hematology  (+) Blood dyscrasia, anemia   Anesthesia Other Findings   Reproductive/Obstetrics negative OB ROS                              Anesthesia Physical Anesthesia Plan  ASA: 3  Anesthesia Plan: General and General LMA   Post-op Pain Management:    Induction:   PONV Risk Score and Plan: Ondansetron   Airway Management Planned:   Additional Equipment:   Intra-op Plan:   Post-operative Plan:   Informed Consent: I have reviewed the patients History and Physical, chart, labs and discussed the procedure including the risks, benefits and alternatives for the proposed anesthesia with the patient or authorized representative who has indicated his/her understanding and acceptance.     Dental Advisory Given  Plan Discussed with: CRNA  Anesthesia Plan Comments:         Anesthesia Quick Evaluation

## 2024-06-08 ENCOUNTER — Emergency Department (HOSPITAL_COMMUNITY)
Admission: EM | Admit: 2024-06-08 | Discharge: 2024-06-08 | Disposition: A | Discharge status: Home / Self Care | Attending: Emergency Medicine | Admitting: Emergency Medicine

## 2024-06-08 ENCOUNTER — Encounter (HOSPITAL_COMMUNITY): Payer: Self-pay

## 2024-06-08 DIAGNOSIS — D72829 Elevated white blood cell count, unspecified: Secondary | ICD-10-CM | POA: Insufficient documentation

## 2024-06-08 DIAGNOSIS — E871 Hypo-osmolality and hyponatremia: Secondary | ICD-10-CM | POA: Insufficient documentation

## 2024-06-08 DIAGNOSIS — Z794 Long term (current) use of insulin: Secondary | ICD-10-CM | POA: Insufficient documentation

## 2024-06-08 DIAGNOSIS — R339 Retention of urine, unspecified: Secondary | ICD-10-CM | POA: Insufficient documentation

## 2024-06-08 DIAGNOSIS — R319 Hematuria, unspecified: Secondary | ICD-10-CM | POA: Diagnosis present

## 2024-06-08 LAB — URINALYSIS, W/ REFLEX TO CULTURE (INFECTION SUSPECTED)
Bacteria, UA: NONE SEEN
Bilirubin Urine: NEGATIVE
Glucose, UA: >=500 mg/dL — AB
Ketones, ur: NEGATIVE mg/dL
Nitrite: NEGATIVE
Protein, ur: 100 mg/dL — AB
RBC / HPF: >50 RBC/hpf (ref 0–5)
Specific Gravity, Urine: 1.018 (ref 1.005–1.030)
WBC, UA: >50 WBC/hpf (ref 0–5)
pH: 6 (ref 5.0–8.0)

## 2024-06-08 LAB — BASIC METABOLIC PANEL WITH GFR
Anion gap: 10 (ref 5–15)
BUN: 32 mg/dL — ABNORMAL HIGH (ref 8–23)
CO2: 23 mmol/L (ref 22–32)
Calcium: 8.7 mg/dL — ABNORMAL LOW (ref 8.9–10.3)
Chloride: 101 mmol/L (ref 98–111)
Creatinine, Ser: 1.65 mg/dL — ABNORMAL HIGH (ref 0.61–1.24)
GFR, Estimated: 43 mL/min — ABNORMAL LOW (ref >60)
Glucose, Bld: 172 mg/dL — ABNORMAL HIGH (ref 70–99)
Potassium: 4.6 mmol/L (ref 3.5–5.1)
Sodium: 134 mmol/L — ABNORMAL LOW (ref 135–145)

## 2024-06-08 LAB — CBC WITH DIFFERENTIAL/PLATELET
Abs Immature Granulocytes: 0.05 K/uL (ref 0.00–0.07)
Basophils Absolute: 0 K/uL (ref 0.0–0.1)
Basophils Relative: 0 %
Eosinophils Absolute: 0.1 K/uL (ref 0.0–0.5)
Eosinophils Relative: 1 %
HCT: 42.6 % (ref 39.0–52.0)
Hemoglobin: 13.2 g/dL (ref 13.0–17.0)
Immature Granulocytes: 0 %
Lymphocytes Relative: 10 %
Lymphs Abs: 1.2 K/uL (ref 0.7–4.0)
MCH: 23.6 pg — ABNORMAL LOW (ref 26.0–34.0)
MCHC: 31 g/dL (ref 30.0–36.0)
MCV: 76.2 fL — ABNORMAL LOW (ref 80.0–100.0)
Monocytes Absolute: 1.1 K/uL — ABNORMAL HIGH (ref 0.1–1.0)
Monocytes Relative: 9 %
Neutro Abs: 9.9 K/uL — ABNORMAL HIGH (ref 1.7–7.7)
Neutrophils Relative %: 80 %
Platelets: 216 K/uL (ref 150–400)
RBC: 5.59 MIL/uL (ref 4.22–5.81)
RDW: 14.8 % (ref 11.5–15.5)
WBC: 12.3 K/uL — ABNORMAL HIGH (ref 4.0–10.5)
nRBC: 0 % (ref 0.0–0.2)

## 2024-06-08 LAB — PROTIME-INR
INR: 1.1 (ref 0.8–1.2)
Prothrombin Time: 15 s (ref 11.4–15.2)

## 2024-06-08 LAB — CBG MONITORING, ED: Glucose-Capillary: 159 mg/dL — ABNORMAL HIGH (ref 70–99)

## 2024-06-08 MED ORDER — HYDROMORPHONE HCL 1 MG/ML IJ SOLN
0.5000 mg | Freq: Once | INTRAMUSCULAR | Status: AC
  Administered 2024-06-08: 0.5 mg via INTRAVENOUS
  Filled 2024-06-08: qty 0.5

## 2024-06-08 NOTE — ED Notes (Signed)
 Foley catheter irrigated with sterile water . Approximately 10mL flushed before urine began draining adequately. of urine drained with a dark red color and several blood clots. Pt expresses immediately discomfort/pain relief.

## 2024-06-08 NOTE — Anesthesia Postprocedure Evaluation (Signed)
 Anesthesia Post Note  Patient: TUCKER MINTER  Procedure(s) Performed: CYSTOSCOPY/URETEROSCOPY/HOLMIUM LASER/STENT EXCHANGE (Left: Renal) HOLMIUM LASER APPLICATION (Left: Renal) CYSTOSCOPY, WITH CALCULUS REMOVAL USING BASKET (Left: Renal)  Patient location during evaluation: Phase II Anesthesia Type: General Level of consciousness: awake Pain management: pain level controlled Vital Signs Assessment: post-procedure vital signs reviewed and stable Respiratory status: spontaneous breathing and respiratory function stable Cardiovascular status: blood pressure returned to baseline and stable Postop Assessment: no headache and no apparent nausea or vomiting Anesthetic complications: no Comments: Late entry   No notable events documented.   Last Vitals:  Vitals:   06/07/24 1300 06/07/24 1317  BP: 131/74 127/60  Pulse: 60 (!) 53  Resp: 17 18  Temp:  36.4 C  SpO2: 100% 100%    Last Pain:  Vitals:   06/08/24 1440  TempSrc:   PainSc: 0-No pain                 Yvonna JINNY Bosworth

## 2024-06-08 NOTE — ED Triage Notes (Signed)
 Pov from home. Cc of urinary retention . Has not been able to fully empty his bladder since 6pm, only dribbles. Had a stent put in yesterday and had some kidney stones broke up.

## 2024-06-08 NOTE — ED Notes (Signed)
 Pt in bed, foley is draining clear liquid, pt denies pain,

## 2024-06-08 NOTE — ED Provider Notes (Signed)
    ED Course / MDM   Clinical Course as of 06/08/24 0930  Fri Jun 08, 2024  0707 Catheter placed by RN, grossly blood urine with clots, will need flushing to get it to drain.  Care of the patient signed out at the change of shift.  [CS]  9285 Received sign out from Dr. Roselyn pending labs/UA. Had urinary retention with hematuria. Recent urologic procedure. Irrigated by nursing. Likely discharge if improving  [WS]  0926 Urine without evidence of bacteria, lower concern for UTI.  Patient required additional irrigation with sterile water  and reports resolution of symptoms.  Feels comfortable going home with Foley and leg bag.  Will have close urology follow-up given his recent urologic procedure.  Patient feels comfortable attempting irrigation at home should he develop any recurrent clot hematuria, will be educated by nursing and given syringe and sterile water .  Also discussed that he can return to the emergency department at any time for any recurrent symptoms. Will discharge patient to home. All questions answered. Patient comfortable with plan of discharge. Return precautions discussed with patient and specified on the after visit summary.  [WS]    Clinical Course User Index [CS] Roselyn Carlin NOVAK, MD [WS] Francesca Elsie CROME, MD   Medical Decision Making Amount and/or Complexity of Data Reviewed Labs: ordered.  Risk Prescription drug management.         Francesca Elsie CROME, MD 06/08/24 0930

## 2024-06-08 NOTE — Discharge Instructions (Addendum)
 We evaluated you for your trouble peeing.  You had developed blood in your urine from your urologic procedure.  When blood forms clots in your bladder, it can cause trouble peeing.  We placed a Foley catheter.  We have given you some supplies that you can try at home to irrigate your catheter if the catheter stops draining and you feel that it is blocked.  You can also return to the emergency department if you do not feel comfortable trying this at home.  Please follow-up closely with Dr. Sherrilee and return for any new or worsening symptoms.

## 2024-06-08 NOTE — ED Provider Notes (Signed)
 [place] EMERGENCY DEPARTMENT AT [facility]  Provider Note  CSN: [id number] Arrival date & time: 06/08/24 9394  History Chief Complaint  Patient presents with   Urinary Retention    Daniel Schaefer is a 73 y.o. male with history of renal stones is 1 day post-op from a laser lithotripsy and stent exchange reports he has been unable to void other than some dribbling since last night. He reports some hematuria, had held his coumadin  pre-op and has not started back yet. No fevers. Reports severe suprapubic pain.    Home Medications Prior to Admission medications   Medication Sig Start Date End Date Taking? Authorizing Provider  amLODipine  (NORVASC ) 5 MG tablet Take 1 tablet (5 mg total) by mouth daily. NEED OV. 05/08/24   Debera Jayson MATSU, MD  Blood Glucose Monitoring Suppl (ACCU-CHEK GUIDE ME) w/Device KIT 1 Piece by Does not apply route as directed. 10/05/21   [name], Gebreselassie W, MD  Calcium Carbonate (CALCIUM 600 PO) Take 300 mg by mouth daily.    [provider]  Cholecalciferol (VITAMIN D3) 125 MCG (5000 UT) TABS Take 5,000 Units by mouth daily.     [provider]  empagliflozin  (JARDIANCE ) 10 MG TABS tablet Take 1 tablet (10 mg total) by mouth daily with breakfast. 03/27/24   [name], Gebreselassie W, MD  Flaxseed, Linseed, (FLAXSEED OIL) 1200 MG CAPS Take 1,200 mg by mouth in the morning.    [provider]  furosemide  (LASIX ) 20 MG tablet Take 1 tablet (20 mg total) by mouth daily. Please call [phone] to schedule an appointment for future refills. Thank you. 03/23/24   Debera Jayson MATSU, MD  GLOBAL EASE INJECT PEN NEEDLES 31G X 8 MM MISC USE 1 DAILY AS DIRECTED - RUN ON CASH $15 - PER PC. 12/21/17   [name], Gebreselassie W, MD  glucose blood (ACCU-CHEK GUIDE TEST) test strip USE 1 STRIP TO CHECK GLUCOSE TWICE DAILY 06/06/24   [name], Gebreselassie W, MD  Landy Tea, Camellia sinensis, (GREEN TEA EXTRACT PO) Take 500 mg by mouth in the morning.     [provider]  insulin  glargine, 2 Unit Dial, (TOUJEO  MAX SOLOSTAR) 300 UNIT/ML Solostar Pen Inject 52 Units into the skin at bedtime. 03/27/24   [name], Gebreselassie W, MD  Insulin  Pen Needle (B-D ULTRAFINE III SHORT PEN) 31G X 8 MM MISC 1 each by Does not apply route as directed. 06/01/18   [name], Gebreselassie W, MD  Lancets MISC 1 each by Does not apply route as directed. 10/05/16   [name], Gebreselassie W, MD  loratadine (CLARITIN) 10 MG tablet Take 10 mg by mouth in the morning.    [provider]  Magnesium 250 MG TABS Take 125 mg by mouth in the morning.    [provider]  Melatonin 10 MG TABS Take 10 mg by mouth at bedtime as needed (sleep).    [provider]  metFORMIN  (GLUCOPHAGE ) 500 MG tablet Take 1 tablet by mouth once daily with breakfast Patient taking differently: Take 500 mg by mouth every evening. 05/07/24   [name], Gebreselassie W, MD  olmesartan  (BENICAR ) 40 MG tablet Take 1 tablet (40 mg total) by mouth daily. REFILLS TO PCP 09/29/23   Darlean Ozell NOVAK, MD  omeprazole (PRILOSEC) 20 MG capsule Take 20 mg by mouth in the morning. 12/24/19   [provider]  ondansetron  (ZOFRAN ) 4 MG tablet Take 1 tablet (4 mg total) by mouth daily as needed for nausea or vomiting. 06/07/24  06/07/25  McKenzie, Belvie CROME, MD  oxyCODONE -acetaminophen  (PERCOCET) 5-325 MG tablet Take 1 tablet by mouth every 4 (four) hours as needed for severe pain (pain score 7-10). 06/07/24 06/07/25  Sherrilee Belvie CROME, MD  potassium chloride  (KLOR-CON ) 10 MEQ tablet TAKE 1 TABLET BY MOUTH DAILY 10/13/23   Debera Jayson MATSU, MD  pravastatin  (PRAVACHOL ) 40 MG tablet TAKE 1 TABLET BY MOUTH AT BEDTIME 02/16/24   Debera Jayson MATSU, MD  Selenium 200 MCG CAPS Take 200 mcg by mouth daily.    [provider]  tamsulosin  (FLOMAX ) 0.4 MG CAPS capsule Take 1 capsule (0.4 mg total) by mouth daily. 06/07/24   McKenzie, Belvie CROME, MD  warfarin (COUMADIN ) 5 MG tablet TAKE ONE TABLET BY MOUTH  ONCE DAILY EXCEPT TAKE ONE-HALF TABLET ON WEDNESDAY OR AS DIRECTED Patient taking differently: Take 5 mg by mouth every evening. OR AS DIRECTED 05/15/24   Debera Jayson MATSU, MD  Zinc 50 MG TABS Take 50 mg by mouth in the morning.    [provider]     Allergies    Bactrim [sulfamethoxazole-trimethoprim], Other, and Tresiba  flextouch [insulin  degludec]   Review of Systems   Review of Systems Please see HPI for pertinent positives and negatives  Physical Exam BP (!) 160/85   Pulse 64   Temp 98 F (36.7 C)   Resp 20   Ht 5' 10 (1.778 m)   Wt 93.7 kg   SpO2 98%   BMI 29.65 kg/m   Physical Exam Vitals and nursing note reviewed.  Constitutional:      Appearance: Normal appearance.  HENT:     Head: Normocephalic and atraumatic.     Nose: Nose normal.     Mouth/Throat:     Mouth: Mucous membranes are moist.  Eyes:     Extraocular Movements: Extraocular movements intact.     Conjunctiva/sclera: Conjunctivae normal.  Cardiovascular:     Rate and Rhythm: Normal rate.  Pulmonary:     Effort: Pulmonary effort is normal.     Breath sounds: Normal breath sounds.  Abdominal:     General: Abdomen is flat.     Palpations: Abdomen is soft.     Tenderness: There is abdominal tenderness (suprapubic).  Musculoskeletal:        General: No swelling. Normal range of motion.     Cervical back: Neck supple.  Skin:    General: Skin is warm and dry.  Neurological:     General: No focal deficit present.     Mental Status: He is alert.  Psychiatric:        Mood and Affect: Mood normal.     ED Results / Procedures / Treatments   EKG None  Procedures Procedures  Medications Ordered in the ED Medications - No data to display  Initial Impression and Plan  Patient here with urinary retention in setting of recent lithotripsy, ureteral stent and anticoagulant use. Bladder scan shows >350cc urine. I spoke briefly with Dr. Selma, Urology on call, who reports no contraindication  to foley placement with stent string in place. Will check labs, including INR.   ED Course   Clinical Course as of 06/08/24 0719  Fri Jun 08, 2024  0707 Catheter placed by RN, grossly blood urine with clots, will need flushing to get it to drain.  Care of the patient signed out at the change of shift.  [CS]  9285 Received sign out from Dr. Roselyn pending labs/UA. Had urinary retention with hematuria. Recent urologic procedure.  Irrigated by nursing. Likely discharge if improving  [WS]    Clinical Course User Index [CS] Roselyn Carlin NOVAK, MD [WS] Francesca Elsie CROME, MD     MDM Rules/Calculators/A&P Medical Decision Making Problems Addressed: Urinary retention: acute illness or injury  Amount and/or Complexity of Data Reviewed Labs: ordered.     Final Clinical Impression(s) / ED Diagnoses Final diagnoses:  Urinary retention    Rx / DC Orders ED Discharge Orders     None        Roselyn Carlin NOVAK, MD 06/08/24 [phone]

## 2024-06-10 LAB — URINE CULTURE: Culture: NO GROWTH

## 2024-06-11 ENCOUNTER — Telehealth: Payer: Self-pay | Admitting: Urology

## 2024-06-11 NOTE — Telephone Encounter (Signed)
 Patient is scheduled to follow up for a post op on 06/25/2024.  Does he need to be seen sooner or keep appt as scheduled?

## 2024-06-11 NOTE — Telephone Encounter (Signed)
 Patient left message with general questions/concerns regarding had to go to ER and have cath placed after surgery because he could not urinate. Wants cath taken out. Patient may be reached at [phone] to discuss questions.

## 2024-06-12 ENCOUNTER — Ambulatory Visit (INDEPENDENT_AMBULATORY_CARE_PROVIDER_SITE_OTHER)

## 2024-06-12 ENCOUNTER — Other Ambulatory Visit: Payer: Self-pay

## 2024-06-12 DIAGNOSIS — Z8546 Personal history of malignant neoplasm of prostate: Secondary | ICD-10-CM

## 2024-06-12 DIAGNOSIS — N200 Calculus of kidney: Secondary | ICD-10-CM | POA: Diagnosis not present

## 2024-06-12 MED ORDER — TAMSULOSIN HCL 0.4 MG PO CAPS: 0.4000 mg | ORAL_CAPSULE | Freq: Two times a day (BID) | ORAL | 0 refills | Status: DC

## 2024-06-12 NOTE — Telephone Encounter (Signed)
 Patient has not started his warfarin back, should he wait until his appt on 10/13 when stent is removed or ok to resume now?

## 2024-06-12 NOTE — Telephone Encounter (Signed)
 Return call to pt from vm concerning pus coming out of his penis  and back ache. Pt state's he's concern have been addressed. Verbalized understanding

## 2024-06-12 NOTE — Telephone Encounter (Signed)
 Patient is aware of MD's instructions to increase flowmax to BID. He requested a bigger bag, he states he has a leg bag currently.  Patient did not think he could change the bag himself so he was added to the NV for a cathter bag change.  Patient is also aware that his stent appt will have to be moved to an AM appt.  Appt updated to 0930 am for VT and stent.  Patient also requested a refill of flomax  until his next appt.  Will refill rx per MD instructions for the increase.

## 2024-06-12 NOTE — Progress Notes (Signed)
 Pt came in today to get cath bag change from leg bag to overnight bag. Pt given instruction on how to change between leg and overnight bag.   [email or web address], CMA

## 2024-06-13 NOTE — Telephone Encounter (Signed)
 Unable to reach patient by phone, mychart message sent with MD recommendations.

## 2024-06-13 NOTE — Telephone Encounter (Signed)
 Verbal from Dr. Sherrilee, patient can resume warfarin after stent is removed.  I tried to call patient to confirm this with him, I left a voicemail to call back.  Direct call back number provided.

## 2024-06-18 LAB — STONE ANALYSIS
Calcium Oxalate Dihydrate: 10 %
Calcium Oxalate Monohydrate: 90 %
Weight Calculi: 149 mg

## 2024-06-19 ENCOUNTER — Encounter

## 2024-06-25 ENCOUNTER — Encounter: Admitting: Urology

## 2024-06-25 ENCOUNTER — Other Ambulatory Visit: Payer: Self-pay | Admitting: Cardiology

## 2024-06-25 ENCOUNTER — Encounter: Payer: Self-pay | Admitting: Urology

## 2024-06-25 ENCOUNTER — Ambulatory Visit: Admitting: Urology

## 2024-06-25 ENCOUNTER — Telehealth: Payer: Self-pay

## 2024-06-25 VITALS — BP 152/71 | HR 77

## 2024-06-25 DIAGNOSIS — N138 Other obstructive and reflux uropathy: Secondary | ICD-10-CM | POA: Diagnosis not present

## 2024-06-25 DIAGNOSIS — N200 Calculus of kidney: Secondary | ICD-10-CM

## 2024-06-25 DIAGNOSIS — R338 Other retention of urine: Secondary | ICD-10-CM | POA: Diagnosis not present

## 2024-06-25 DIAGNOSIS — Z8546 Personal history of malignant neoplasm of prostate: Secondary | ICD-10-CM

## 2024-06-25 DIAGNOSIS — N401 Enlarged prostate with lower urinary tract symptoms: Secondary | ICD-10-CM | POA: Diagnosis not present

## 2024-06-25 DIAGNOSIS — R3912 Poor urinary stream: Secondary | ICD-10-CM

## 2024-06-25 MED ORDER — TAMSULOSIN HCL 0.4 MG PO CAPS: 0.4000 mg | ORAL_CAPSULE | Freq: Two times a day (BID) | ORAL | 11 refills | Status: DC

## 2024-06-25 MED ORDER — CIPROFLOXACIN HCL 500 MG PO TABS
500.0000 mg | ORAL_TABLET | Freq: Once | ORAL | Status: AC
  Administered 2024-06-25: 500 mg via ORAL

## 2024-06-25 NOTE — Progress Notes (Signed)
 Pt returned due to being unable to void   Bladder Scan completed today due to reason of urinary retention  Patient cannot void prior to the bladder scan. Bladder scan result: 501  Performed By: Exie DASEN. CMA  Additional notes- Patient is scheduled to follow up with VT in a week   Simple Catheter Placement  Due to urinary retention patient is present today for a foley cath placement.  Patient was cleaned and prepped in a sterile fashion with Betadinex3 . A 16 FR foley catheter was inserted, urine return was noted  , urine was Clear yellow in color.  The balloon was filled with 10cc of sterile water .  A bed bag was attached for drainage. Patient was also given a night bag to take home and was given instruction on how to change from one bag to another.  Patient was given instruction on proper catheter care.  Patient tolerated well, no complications were noted   Performed by: Exie DASEN. CMA  Additional notes/ Follow up:

## 2024-06-25 NOTE — Patient Instructions (Signed)

## 2024-06-25 NOTE — Telephone Encounter (Signed)
 Pt called due to not being able to void pt states he has been drinking plenty of water  and has not urinated since he left his appt this morning pt was advised to return to our office for PVR and possible cath placement

## 2024-06-25 NOTE — Progress Notes (Signed)
 Fill and Pull Catheter Removal  Patient is present today for a catheter removal due to post ureteroscopy.  115 ml of sterile water  was instilled into the bladder when the patient felt the urge to urinate. 10ml of water  was then drained from the balloon.  A 16FR foley cath was removed from the bladder no complications were noted .  Foley catheter intact and time of removal. Patient as then given some time to void on their own.  Patient can void  115 ml on their own after some time.  Patient tolerated well.  One oral prophylactic antibiotic given per MD orders  Performed by: Exie T. CMA  Follow up/ Additional notes: as scheduled

## 2024-06-25 NOTE — Progress Notes (Signed)
 06/25/2024 10:03 AM   Daniel Schaefer 01-19-1951 [id number]  Referring provider: Orpha Yancey LABOR, MD [address] [place],  [place] [address]  Followup nephrolithiasis   HPI: [name] is a 73yo here for followup for nephrolithiasis. He developed urinary retention after his ureteroscopy requiring foley placement. Stone composition CaOx. He was on flomax  daily which was increased to flomax  0.4mg  BID.    PMH: Past Medical History:  Diagnosis Date   Atrial fibrillation [place])    Reportedly diagnosed November 2011   Chronic kidney disease    stage 2 Kidney disease   Essential hypertension    Heart failure (HCC)    History of kidney stones    Prostate cancer (HCC)    Radiation implants   Type 2 diabetes mellitus (HCC)     Surgical History: Past Surgical History:  Procedure Laterality Date   COLONOSCOPY WITH PROPOFOL  N/A 12/02/2023   Procedure: COLONOSCOPY WITH PROPOFOL ;  Surgeon: Cinderella Deatrice FALCON, MD;  Location: [facility];  Service: Endoscopy;  Laterality: N/A;  7:30am;asa 3   CYSTOSCOPY/RETROGRADE/URETEROSCOPY/STONE EXTRACTION WITH BASKET Left 06/07/2024   Procedure: CYSTOSCOPY, WITH CALCULUS REMOVAL USING BASKET;  Surgeon: Sherrilee Belvie CROME, MD;  Location: [facility] ORS;  Service: Urology;  Laterality: Left;   CYSTOSCOPY/URETEROSCOPY/HOLMIUM LASER/STENT PLACEMENT Left 05/10/2024   Procedure: LEFT DIAGNOSTIC URETEROSCOPY, LEFT STENT PLACEMENT;  Surgeon: Sherrilee Belvie CROME, MD;  Location: [facility];  Service: Urology;  Laterality: Left;   CYSTOSCOPY/URETEROSCOPY/HOLMIUM LASER/STENT PLACEMENT Left 06/07/2024   Procedure: CYSTOSCOPY/URETEROSCOPY/HOLMIUM LASER/STENT EXCHANGE;  Surgeon: Sherrilee Belvie CROME, MD;  Location: [facility];  Service: Urology;  Laterality: Left;   Gold seed placement     HEMOSTASIS CLIP PLACEMENT  12/02/2023   Procedure: CONTROL OF HEMORRHAGE, GI TRACT, ENDOSCOPIC, BY CLIPPING OR OVERSEWING;  Surgeon: Cinderella Deatrice FALCON, MD;  Location: [facility];  Service:  Endoscopy;;   HOLMIUM LASER APPLICATION Left 06/07/2024   Procedure: HOLMIUM LASER APPLICATION;  Surgeon: Sherrilee Belvie CROME, MD;  Location: [facility];  Service: Urology;  Laterality: Left;   INGUINAL HERNIA REPAIR Left 07/02/2020   Procedure: LEFT INGUINAL HERNIORRHAPY WITH MESH;  Surgeon: Mavis Anes, MD;  Location: [facility];  Service: General;  Laterality: Left;   LITHOTRIPSY     POLYPECTOMY  12/02/2023   Procedure: POLYPECTOMY;  Surgeon: Cinderella Deatrice FALCON, MD;  [facility]: [facility];  Service: Endoscopy;;   PROSTATE BIOPSY     TRIGGER FINGER RELEASE Left    ring finger   URETEROSCOPY WITH HOLMIUM LASER LITHOTRIPSY      Home Medications:  Allergies as of 06/25/2024       Reactions   Bactrim [sulfamethoxazole-trimethoprim] Other (See Comments)   Thin his blood really bad   Other    Cannot mix warfarin and bactrim    Tresiba  Flextouch [insulin  Degludec] Rash        Medication List        Accurate as of June 25, 2024 10:03 AM. If you have any questions, ask your nurse or doctor.          Accu-Chek Guide Me w/Device Kit 1 Piece by Does not apply route as directed.   Accu-Chek Guide Test test strip Generic drug: glucose blood USE 1 STRIP TO CHECK GLUCOSE TWICE DAILY   amLODipine  5 MG tablet Commonly known as: NORVASC  Take 1 tablet (5 mg total) by mouth daily. NEED OV.   CALCIUM 600 PO Take 300 mg by mouth daily.   empagliflozin  10 MG Tabs tablet Commonly known as: Jardiance   Take 1 tablet (10 mg total) by mouth daily with breakfast.   Flaxseed Oil 1200 MG Caps Take 1,200 mg by mouth in the morning.   furosemide  20 MG tablet Commonly known as: LASIX  Take 1 tablet (20 mg total) by mouth daily. Please call [phone] to schedule an appointment for future refills. Thank you.   Global Ease Inject Pen Needles 31G X 8 MM Misc Generic drug: Insulin  Pen Needle USE 1 DAILY AS DIRECTED - RUN ON CASH $15 - PER PC.   Insulin  Pen Needle 31G X 8 MM Misc Commonly  known as: B-D ULTRAFINE III SHORT PEN 1 each by Does not apply route as directed.   GREEN TEA EXTRACT PO Take 500 mg by mouth in the morning.   Lancets Misc 1 each by Does not apply route as directed.   loratadine 10 MG tablet Commonly known as: CLARITIN Take 10 mg by mouth in the morning.   Magnesium 250 MG Tabs Take 125 mg by mouth in the morning.   Melatonin 10 MG Tabs Take 10 mg by mouth at bedtime as needed (sleep).   metFORMIN  500 MG tablet Commonly known as: GLUCOPHAGE  Take 1 tablet by mouth once daily with breakfast What changed: when to take this   olmesartan  40 MG tablet Commonly known as: BENICAR  Take 1 tablet (40 mg total) by mouth daily. REFILLS TO PCP   omeprazole 20 MG capsule Commonly known as: PRILOSEC Take 20 mg by mouth in the morning.   ondansetron  4 MG tablet Commonly known as: Zofran  Take 1 tablet (4 mg total) by mouth daily as needed for nausea or vomiting.   oxyCODONE -acetaminophen  5-325 MG tablet Commonly known as: Percocet Take 1 tablet by mouth every 4 (four) hours as needed for severe pain (pain score 7-10).   potassium chloride  10 MEQ tablet Commonly known as: KLOR-CON  TAKE 1 TABLET BY MOUTH DAILY   pravastatin  40 MG tablet Commonly known as: PRAVACHOL  TAKE 1 TABLET BY MOUTH AT BEDTIME   Selenium 200 MCG Caps Take 200 mcg by mouth daily.   tamsulosin  0.4 MG Caps capsule Commonly known as: FLOMAX  Take 1 capsule (0.4 mg total) by mouth in the morning and at bedtime.   Toujeo  Max SoloStar 300 UNIT/ML Solostar Pen Generic drug: insulin  glargine (2 Unit Dial) Inject 52 Units into the skin at bedtime.   Vitamin D3 125 MCG (5000 UT) Tabs Take 5,000 Units by mouth daily.   warfarin 5 MG tablet Commonly known as: COUMADIN  Take as directed by the anticoagulation clinic. If you are unsure how to take this medication, talk to your nurse or doctor. Original instructions: TAKE ONE TABLET BY MOUTH ONCE DAILY EXCEPT TAKE ONE-HALF TABLET ON  WEDNESDAY OR AS DIRECTED What changed:  how much to take how to take this when to take this additional instructions   Zinc 50 MG Tabs Take 50 mg by mouth in the morning.        Allergies:  Allergies  Allergen Reactions   Bactrim [Sulfamethoxazole-Trimethoprim] Other (See Comments)    Thin his blood really bad   Other     Cannot mix warfarin and bactrim    Tresiba  Flextouch [Insulin  Degludec] Rash    Family History: Family History  Problem Relation Age of Onset   Cancer Father 20       Laryngeal   Lymphoma Mother 26    Social History:  reports that he has never smoked. He has never used smokeless tobacco. He reports that he does  not drink alcohol and does not use drugs.  ROS: All other review of systems were reviewed and are negative except what is noted above in HPI  Physical Exam: BP (!) 152/71   Pulse 77   Constitutional:  Alert and oriented, No acute distress. HEENT: [place] AT, moist mucus membranes.  Trachea midline, no masses. Cardiovascular: No clubbing, cyanosis, or edema. Respiratory: Normal respiratory effort, no increased work of breathing. GI: Abdomen is soft, nontender, nondistended, no abdominal masses GU: No CVA tenderness.  Lymph: No cervical or inguinal lymphadenopathy. Skin: No rashes, bruises or suspicious lesions. Neurologic: Grossly intact, no focal deficits, moving all 4 extremities. Psychiatric: Normal mood and affect.  Laboratory Data: Lab Results  Component Value Date   WBC 12.3 (H) 06/08/2024   HGB 13.2 06/08/2024   HCT 42.6 06/08/2024   MCV 76.2 (L) 06/08/2024   PLT 216 06/08/2024    Lab Results  Component Value Date   CREATININE 1.65 (H) 06/08/2024    No results found for: PSA  Lab Results  Component Value Date   TESTOSTERONE  165 (L) 03/18/2022    Lab Results  Component Value Date   HGBA1C 6.7 (H) 06/04/2024    Urinalysis    Component Value Date/Time   COLORURINE AMBER (A) 06/08/2024 0730   APPEARANCEUR CLOUDY  (A) 06/08/2024 0730   APPEARANCEUR Clear 05/03/2024 1400   LABSPEC 1.018 06/08/2024 0730   PHURINE 6.0 06/08/2024 0730   GLUCOSEU >=500 (A) 06/08/2024 0730   HGBUR MODERATE (A) 06/08/2024 0730   BILIRUBINUR NEGATIVE 06/08/2024 0730   BILIRUBINUR Negative 05/03/2024 1400   KETONESUR NEGATIVE 06/08/2024 0730   PROTEINUR 100 (A) 06/08/2024 0730   NITRITE NEGATIVE 06/08/2024 0730   LEUKOCYTESUR SMALL (A) 06/08/2024 0730    Lab Results  Component Value Date   LABMICR Comment 05/03/2024   WBCUA None seen 12/25/2020   LABEPIT None seen 12/25/2020   BACTERIA NONE SEEN 06/08/2024    Pertinent Imaging:  Results for orders placed in visit on 03/29/24  Abdomen 1 view (KUB)  Narrative CLINICAL DATA:  Nephrolithiasis.  EXAM: ABDOMEN - 1 VIEW  COMPARISON:  CT 09/27/2022  FINDINGS: Multiple bilateral intrarenal calculi. Largest stone on the left measures 18 mm. Largest stone on the right measures 3 mm. No evidence of ureteral stone. Calcifications in the pelvis correspond to phleboliths on prior CT. Normal bowel gas pattern. Brachytherapy seeds in the prostate.  IMPRESSION: Multiple bilateral intrarenal calculi.   Electronically Signed By: Andrea Gasman M.D. On: 03/31/2024 21:35  No results found for this or any previous visit.  No results found for this or any previous visit.  No results found for this or any previous visit.  No results found for this or any previous visit.  No results found for this or any previous visit.  No results found for this or any previous visit.  Results for orders placed during the hospital encounter of 09/27/22  CT Renal Stone Study  Narrative CLINICAL DATA:  Left flank pain for 4 days.  EXAM: CT ABDOMEN AND PELVIS WITHOUT CONTRAST  TECHNIQUE: Multidetector CT imaging of the abdomen and pelvis was performed following the standard protocol without IV contrast.  RADIATION DOSE REDUCTION: This exam was performed according to  the departmental dose-optimization program which includes automated exposure control, adjustment of the mA and/or kV according to patient size and/or use of iterative reconstruction technique.  COMPARISON:  None Available.  FINDINGS: Lower chest: Patchy consolidation of the left lung base involving the left lower lobe  and left upper lobe. Small left pleural effusion noted.  Hepatobiliary: No focal liver lesion identified. Gallstones are noted the gallbladder. The biliary tree is normal.  Pancreas: Unremarkable. No pancreatic ductal dilatation or surrounding inflammatory changes.  Spleen: Normal in size without focal abnormality.  Adrenals/Urinary Tract: The bilateral adrenal glands are normal. Nonobstructing stones identified in both kidneys, largest in the left kidney measuring 0.8 cm. No hydronephrosis bilaterally. Bladder is normal.  Stomach/Bowel: Stomach is within normal limits. Appendix appears normal. No evidence of bowel wall thickening, distention, or inflammatory changes.  Vascular/Lymphatic: Aortic atherosclerosis. No enlarged abdominal or pelvic lymph nodes.  Reproductive: Brachy therapy seeds are identified in the prostate gland.  Other: 2.9 cm right inguinal herniation of mesenteric fat is noted. Small umbilical herniation of mesenteric fat  Musculoskeletal: Degenerative joint changes of the spine are noted.  IMPRESSION: 1. Patchy consolidation of the left lung base involving the left lower lobe and left upper lobe. This is suspicious for pneumonia. 2. Nonobstructing stones in both kidneys, largest in the left kidney measuring 0.8 cm. No hydronephrosis bilaterally. 3. Cholelithiasis. 4. Aortic atherosclerosis.  Aortic Atherosclerosis (ICD10-I70.0).   Electronically Signed By: Craig Farr M.D. On: 09/27/2022 14:15   Assessment & Plan:    1. BPH with urinary obstruction Continue flomax  0.4mg  BID  3. Renal calculus -followup 3 months with a  renal US   4. Weak urinary stream Continue flomax  0.4mg  BID   No follow-ups on file.  Belvie Clara, MD  [facility] Urology [place]

## 2024-06-27 DIAGNOSIS — I10 Essential (primary) hypertension: Secondary | ICD-10-CM | POA: Diagnosis not present

## 2024-06-27 DIAGNOSIS — E1122 Type 2 diabetes mellitus with diabetic chronic kidney disease: Secondary | ICD-10-CM | POA: Diagnosis not present

## 2024-07-04 ENCOUNTER — Ambulatory Visit (INDEPENDENT_AMBULATORY_CARE_PROVIDER_SITE_OTHER)

## 2024-07-04 DIAGNOSIS — Z8546 Personal history of malignant neoplasm of prostate: Secondary | ICD-10-CM

## 2024-07-04 DIAGNOSIS — R339 Retention of urine, unspecified: Secondary | ICD-10-CM

## 2024-07-04 DIAGNOSIS — N200 Calculus of kidney: Secondary | ICD-10-CM

## 2024-07-04 MED ORDER — SILODOSIN 8 MG PO CAPS: 8.0000 mg | ORAL_CAPSULE | Freq: Every day | ORAL | 11 refills | Status: DC

## 2024-07-04 MED ORDER — CIPROFLOXACIN HCL 500 MG PO TABS
500.0000 mg | ORAL_TABLET | Freq: Once | ORAL | Status: AC
  Administered 2024-07-04: 500 mg via ORAL

## 2024-07-04 NOTE — Progress Notes (Cosign Needed Addendum)
 Fill and Pull Catheter Removal  Patient is present today for a catheter removal due to History of Adenocarcinoma of prostate.  100 ml of sterile water  was instilled into the bladder when the patient felt the urge to urinate. 10 ml of water  was then drained from the balloon.  A 16 FR foley cath was removed from the bladder no complications were noted .  Foley catheter intact and time of removal. Patient as then given some time to void on their own.  Pt had multiple bladder spasm, Per  MD  McKenzie, pull pt cath and have him come back at 1 pm Patient can void  20 ml on their own after some time.  Patient tolerated well.  One oral prophylactic antibiotic given per MD orders  Performed by: Daniel Schaefer, CMA  Follow up/ Additional notes: PVR @1pm   Simple Catheter Placement  Due to urinary retention patient is present today for a foley cath placement.  Patient was cleaned and prepped in a sterile fashion with Betadinex3 . A 16 FR foley catheter was inserted, urine return was noted  575 ml, urine was Clear yellow in color.  The balloon was filled with 10cc of sterile water .  A night bag was attached for drainage. Patient was also given a night bag to take home and was given instruction on how to change from one bag to another.  Patient was given instruction on proper catheter care.  Patient tolerated well, no complications were noted   Performed by: Daniel Schaefer, CMA  Additional notes/ Follow up:   2 week VT per MD, McKenzie  Bladder Scan completed today due to reason Urinary retention  Patient cannot void prior to the bladder scan. Bladder scan result: 492  Performed By: Daniel Schaefer, CMA  Additional notes- Patient is scheduled to follow up with 2 week VT

## 2024-07-10 DIAGNOSIS — I5032 Chronic diastolic (congestive) heart failure: Secondary | ICD-10-CM | POA: Diagnosis not present

## 2024-07-10 DIAGNOSIS — N182 Chronic kidney disease, stage 2 (mild): Secondary | ICD-10-CM | POA: Diagnosis not present

## 2024-07-10 DIAGNOSIS — J309 Allergic rhinitis, unspecified: Secondary | ICD-10-CM | POA: Diagnosis not present

## 2024-07-10 DIAGNOSIS — E1122 Type 2 diabetes mellitus with diabetic chronic kidney disease: Secondary | ICD-10-CM | POA: Diagnosis not present

## 2024-07-10 DIAGNOSIS — K219 Gastro-esophageal reflux disease without esophagitis: Secondary | ICD-10-CM | POA: Diagnosis not present

## 2024-07-10 DIAGNOSIS — I10 Essential (primary) hypertension: Secondary | ICD-10-CM | POA: Diagnosis not present

## 2024-07-10 DIAGNOSIS — L84 Corns and callosities: Secondary | ICD-10-CM | POA: Diagnosis not present

## 2024-07-10 DIAGNOSIS — I482 Chronic atrial fibrillation, unspecified: Secondary | ICD-10-CM | POA: Diagnosis not present

## 2024-07-11 NOTE — Progress Notes (Signed)
 Daniel Schaefer                                          MRN: [id number]   07/11/2024   The [facility] Quality Team Specialist reviewed this patient medical record for the purposes of chart review for care gap closure. The following were reviewed: abstraction for care gap closure-kidney health evaluation for diabetes:uACR.    VBCI Quality Team

## 2024-07-16 ENCOUNTER — Telehealth: Payer: Self-pay

## 2024-07-16 ENCOUNTER — Ambulatory Visit

## 2024-07-16 DIAGNOSIS — N401 Enlarged prostate with lower urinary tract symptoms: Secondary | ICD-10-CM

## 2024-07-16 DIAGNOSIS — N138 Other obstructive and reflux uropathy: Secondary | ICD-10-CM

## 2024-07-16 DIAGNOSIS — R338 Other retention of urine: Secondary | ICD-10-CM

## 2024-07-16 LAB — BLADDER SCAN AMB NON-IMAGING: Scan Result: 489

## 2024-07-16 MED ORDER — CIPROFLOXACIN HCL 500 MG PO TABS
500.0000 mg | ORAL_TABLET | Freq: Once | ORAL | Status: AC
  Administered 2024-07-16: 500 mg via ORAL

## 2024-07-16 NOTE — Telephone Encounter (Signed)
 Pt in office today for VT.

## 2024-07-16 NOTE — Progress Notes (Signed)
 Fill and Pull Catheter Removal  Patient is present today for a catheter removal due to BPH with urinary obstruction.  300 ml of sterile water  was instilled into the bladder when the patient felt the urge to urinate. 10 ml of water  was then drained from the balloon.  A 16 FR foley cath was removed from the bladder no complications were noted .  Foley catheter intact and time of removal. Patient as then given some time to void on their own.  Patient can void  175 ml on their own after some time.  Patient tolerated well.  One oral prophylactic antibiotic given per MD orders  Performed by: Carlos, CMA  Follow up/ Additional notes: Return at 1pm   Bladder Scan completed today due to reason BPH with urinary obstruction  Patient cannot void prior to the bladder scan. Bladder scan result: 489  Performed By: Carlos, CMA  Additional notes- Patient is scheduled to follow up with N/A   Simple Catheter Placement  Due to urinary retention patient is present today for a foley cath placement.  Patient was cleaned and prepped in a sterile fashion with Betadinex3 . A 16  FR foley catheter was inserted, urine return was noted  600 ml, urine was Clear yellow in color.  The balloon was filled with 10cc of sterile water .  A night bag was attached for drainage. Patient was also given a night bag to take home and was given instruction on how to change from one bag to another.  Patient was given instruction on proper catheter care.  Patient tolerated well, no complications were noted   Performed by: Carlos, CMA  Additional notes/ Follow up:

## 2024-07-17 ENCOUNTER — Other Ambulatory Visit: Payer: Self-pay

## 2024-07-17 DIAGNOSIS — E1159 Type 2 diabetes mellitus with other circulatory complications: Secondary | ICD-10-CM

## 2024-07-17 MED ORDER — TOUJEO MAX SOLOSTAR 300 UNIT/ML ~~LOC~~ SOPN: 52.0000 [IU] | PEN_INJECTOR | Freq: Every day | SUBCUTANEOUS | 0 refills | Status: DC

## 2024-07-17 NOTE — Telephone Encounter (Signed)
 Called pt and left detailed Vm making pt aware of cysto appointment 12/10.

## 2024-07-17 NOTE — Telephone Encounter (Signed)
 Have him see me for cysto

## 2024-07-25 ENCOUNTER — Other Ambulatory Visit: Payer: Self-pay | Admitting: "Endocrinology

## 2024-07-25 DIAGNOSIS — E1159 Type 2 diabetes mellitus with other circulatory complications: Secondary | ICD-10-CM

## 2024-07-31 ENCOUNTER — Encounter: Payer: Self-pay | Admitting: "Endocrinology

## 2024-07-31 ENCOUNTER — Ambulatory Visit: Admitting: "Endocrinology

## 2024-07-31 VITALS — BP 130/82 | HR 64 | Ht 70.0 in | Wt 204.0 lb

## 2024-07-31 DIAGNOSIS — E782 Mixed hyperlipidemia: Secondary | ICD-10-CM | POA: Diagnosis not present

## 2024-07-31 DIAGNOSIS — E1159 Type 2 diabetes mellitus with other circulatory complications: Secondary | ICD-10-CM | POA: Diagnosis not present

## 2024-07-31 DIAGNOSIS — Z794 Long term (current) use of insulin: Secondary | ICD-10-CM | POA: Diagnosis not present

## 2024-07-31 DIAGNOSIS — I10 Essential (primary) hypertension: Secondary | ICD-10-CM

## 2024-07-31 MED ORDER — TOUJEO MAX SOLOSTAR 300 UNIT/ML ~~LOC~~ SOPN: 52.0000 [IU] | PEN_INJECTOR | Freq: Every day | SUBCUTANEOUS | 0 refills | Status: AC

## 2024-07-31 MED ORDER — EMPAGLIFLOZIN 10 MG PO TABS
10.0000 mg | ORAL_TABLET | Freq: Every day | ORAL | 1 refills | Status: AC
Start: 2024-07-31 — End: ?

## 2024-07-31 NOTE — Progress Notes (Signed)
 07/31/2024  Endocrinology follow-up note   Subjective:    Patient ID: Daniel Schaefer, male    DOB: 26-Mar-1951,    Past Medical History:  Diagnosis Date   Atrial fibrillation [facility])    Reportedly diagnosed November 2011   Chronic kidney disease    stage 2 Kidney disease   Essential hypertension    Heart failure (HCC)    History of kidney stones    Prostate cancer (HCC)    Radiation implants   Type 2 diabetes mellitus (HCC)    Past Surgical History:  Procedure Laterality Date   COLONOSCOPY WITH PROPOFOL  N/A 12/02/2023   Procedure: COLONOSCOPY WITH PROPOFOL ;  Surgeon: Cinderella Deatrice FALCON, MD;  [facility];  Service: Endoscopy;  Laterality: N/A;  7:30am;asa 3   CYSTOSCOPY/RETROGRADE/URETEROSCOPY/STONE EXTRACTION WITH BASKET Left 06/07/2024   Procedure: CYSTOSCOPY, WITH CALCULUS REMOVAL USING BASKET;  Surgeon: Sherrilee Belvie CROME, MD;  Location: [facility] ORS;  Service: Urology;  Laterality: Left;   CYSTOSCOPY/URETEROSCOPY/HOLMIUM LASER/STENT PLACEMENT Left 05/10/2024   Procedure: LEFT DIAGNOSTIC URETEROSCOPY, LEFT STENT PLACEMENT;  Surgeon: Sherrilee Belvie CROME, MD;  Location: [facility];  Service: Urology;  Laterality: Left;   CYSTOSCOPY/URETEROSCOPY/HOLMIUM LASER/STENT PLACEMENT Left 06/07/2024   Procedure: CYSTOSCOPY/URETEROSCOPY/HOLMIUM LASER/STENT EXCHANGE;  Surgeon: Sherrilee Belvie CROME, MD;  Location: [facility];  Service: Urology;  Laterality: Left;   Gold seed placement     HEMOSTASIS CLIP PLACEMENT  12/02/2023   Procedure: CONTROL OF HEMORRHAGE, GI TRACT, ENDOSCOPIC, BY CLIPPING OR OVERSEWING;  Surgeon: Cinderella Deatrice FALCON, MD;  Location: [facility];  Service: Endoscopy;;   HOLMIUM LASER APPLICATION Left 06/07/2024   Procedure: HOLMIUM LASER APPLICATION;  Surgeon: Sherrilee Belvie CROME, MD;  Location: [facility];  Service: Urology;  Laterality: Left;   INGUINAL HERNIA REPAIR Left 07/02/2020   Procedure: LEFT INGUINAL HERNIORRHAPY WITH MESH;  Surgeon: Mavis Anes, MD;  Location: [facility];   Service: General;  Laterality: Left;   LITHOTRIPSY     POLYPECTOMY  12/02/2023   Procedure: POLYPECTOMY;  Surgeon: Cinderella Deatrice FALCON, MD;  [facility];  Service: Endoscopy;;   PROSTATE BIOPSY     TRIGGER FINGER RELEASE Left    ring finger   URETEROSCOPY WITH HOLMIUM LASER LITHOTRIPSY     Social History   Socioeconomic History   Marital status: Divorced    Spouse name: Not on file   Number of children: Not on file   Years of education: Not on file   Highest education level: Not on file  Occupational History   Not on file  Tobacco Use   Smoking status: Never   Smokeless tobacco: Never  Vaping Use   Vaping status: Never Used  Substance and Sexual Activity   Alcohol use: No    Alcohol/week: 0.0 standard drinks of alcohol   Drug use: No   Sexual activity: Yes  Other Topics Concern   Not on file  Social History Narrative   Not on file   Social Drivers of Health   Financial Resource Strain: Not on file  Food Insecurity: Not on file  Transportation Needs: Not on file  Physical Activity: Not on file  Stress: Not on file  Social Connections: Not on file   Outpatient Encounter Medications as of 07/31/2024  Medication Sig   ACCU-CHEK GUIDE TEST test strip USE 1 STRIP TO CHECK GLUCOSE TWICE DAILY   amLODipine  (NORVASC ) 5 MG tablet Take 1 tablet (5 mg total) by mouth daily. NEED OV.   Blood Glucose Monitoring Suppl (ACCU-CHEK GUIDE ME)  w/Device KIT 1 Piece by Does not apply route as directed.   Calcium Carbonate (CALCIUM 600 PO) Take 300 mg by mouth daily.   Cholecalciferol (VITAMIN D3) 125 MCG (5000 UT) TABS Take 5,000 Units by mouth daily.    empagliflozin  (JARDIANCE ) 10 MG TABS tablet Take 1 tablet (10 mg total) by mouth daily with breakfast.   Flaxseed, Linseed, (FLAXSEED OIL) 1200 MG CAPS Take 1,200 mg by mouth in the morning.   furosemide  (LASIX ) 20 MG tablet Take 1 tablet (20 mg total) by mouth daily. Please call [phone] to schedule an appointment for  future refills. Thank you.   GLOBAL EASE INJECT PEN NEEDLES 31G X 8 MM MISC USE 1 DAILY AS DIRECTED - RUN ON CASH $15 - PER PC.   Green Tea, Camellia sinensis, (GREEN TEA EXTRACT PO) Take 500 mg by mouth in the morning.   insulin  glargine, 2 Unit Dial, (TOUJEO  MAX SOLOSTAR) 300 UNIT/ML Solostar Pen Inject 52 Units into the skin at bedtime.   Insulin  Pen Needle (B-D ULTRAFINE III SHORT PEN) 31G X 8 MM MISC 1 each by Does not apply route as directed.   Lancets MISC 1 each by Does not apply route as directed.   loratadine (CLARITIN) 10 MG tablet Take 10 mg by mouth in the morning.   Magnesium 250 MG TABS Take 125 mg by mouth in the morning.   Melatonin 10 MG TABS Take 10 mg by mouth at bedtime as needed (sleep).   metFORMIN  (GLUCOPHAGE ) 500 MG tablet Take 1 tablet by mouth once daily with breakfast (Patient taking differently: Take 500 mg by mouth every evening.)   olmesartan  (BENICAR ) 40 MG tablet Take 1 tablet (40 mg total) by mouth daily. REFILLS TO PCP   omeprazole (PRILOSEC) 20 MG capsule Take 20 mg by mouth in the morning.   ondansetron  (ZOFRAN ) 4 MG tablet Take 1 tablet (4 mg total) by mouth daily as needed for nausea or vomiting.   oxyCODONE -acetaminophen  (PERCOCET) 5-325 MG tablet Take 1 tablet by mouth every 4 (four) hours as needed for severe pain (pain score 7-10).   potassium chloride  (KLOR-CON ) 10 MEQ tablet TAKE 1 TABLET BY MOUTH DAILY   pravastatin  (PRAVACHOL ) 40 MG tablet TAKE 1 TABLET BY MOUTH AT BEDTIME   Selenium 200 MCG CAPS Take 200 mcg by mouth daily.   silodosin (RAPAFLO) 8 MG CAPS capsule Take 1 capsule (8 mg total) by mouth daily with breakfast.   warfarin (COUMADIN ) 5 MG tablet TAKE ONE TABLET BY MOUTH ONCE DAILY EXCEPT TAKE ONE-HALF TABLET ON WEDNESDAY OR AS DIRECTED (Patient taking differently: Take 5 mg by mouth every evening. OR AS DIRECTED)   Zinc 50 MG TABS Take 50 mg by mouth in the morning.   [DISCONTINUED] empagliflozin  (JARDIANCE ) 10 MG TABS tablet Take 1 tablet  (10 mg total) by mouth daily with breakfast.   [DISCONTINUED] insulin  glargine, 2 Unit Dial, (TOUJEO  MAX SOLOSTAR) 300 UNIT/ML Solostar Pen Inject 52 Units into the skin at bedtime.   No facility-administered encounter medications on file as of 07/31/2024.   ALLERGIES: Allergies  Allergen Reactions   Bactrim [Sulfamethoxazole-Trimethoprim] Other (See Comments)    Thin his blood really bad   Other     Cannot mix warfarin and bactrim    Tresiba  Flextouch [Insulin  Degludec] Rash   VACCINATION STATUS: Immunization History  Administered Date(s) Administered   Moderna Sars-Covid-2 Vaccination 10/04/2019, 11/05/2019    Diabetes He presents for his follow-up diabetic visit. He has type 2 diabetes mellitus. Onset  time: He was diagnosed at approximate age of 13 years. His disease course has been improving. There are no hypoglycemic associated symptoms. There are no diabetic associated symptoms. There are no hypoglycemic complications. Symptoms are improving. Diabetic complications include nephropathy. Risk factors for coronary artery disease include hypertension, dyslipidemia, sedentary lifestyle, male sex and diabetes mellitus. Current diabetic treatment includes oral agent (monotherapy) and insulin  injections. He is compliant with treatment most of the time. His weight is decreasing steadily. He is following a generally unhealthy diet. When asked about meal planning, he reported none. He has not had a previous visit with a dietitian. He rarely participates in exercise. His home blood glucose trend is decreasing steadily. His breakfast blood glucose range is generally 90-110 mg/dl. His bedtime blood glucose range is generally 130-140 mg/dl. His overall blood glucose range is 130-140 mg/dl. (He comes without any logs or no meter.  He reports he is close blood glucose readings are in the 80s and highest upper 100s.  His previsit labs show A1c of 6.7% but improving.  He denies any difficult hypoglycemia.    ) An ACE inhibitor/angiotensin II receptor blocker is being taken. He does not see a podiatrist.Eye exam is current.  Hypertension This is a chronic problem. The current episode started more than 1 year ago. The problem is unchanged. The problem is controlled. There are no associated agents to hypertension. Risk factors for coronary artery disease include diabetes mellitus, dyslipidemia, male gender and sedentary lifestyle. Past treatments include ACE inhibitors, calcium channel blockers, diuretics and beta blockers. The current treatment provides moderate improvement. There are no compliance problems.  Hypertensive end-organ damage includes kidney disease. Identifiable causes of hypertension include chronic renal disease.  Hyperlipidemia This is a chronic problem. The current episode started more than 1 year ago. The problem is controlled. Recent lipid tests were reviewed and are normal. Exacerbating diseases include chronic renal disease and diabetes. Factors aggravating his hyperlipidemia include beta blockers. Current antihyperlipidemic treatment includes statins. The current treatment provides moderate improvement of lipids. There are no compliance problems.  Risk factors for coronary artery disease include dyslipidemia, diabetes mellitus, hypertension, male sex and a sedentary lifestyle.    Review of systems  Constitutional: + Minimally fluctuating body weight,  current Body mass index is 29.27 kg/m. , no fatigue, no subjective hyperthermia, no subjective hypothermia    Objective:    BP 130/82   Pulse 64   Ht 5' 10 (1.778 m)   Wt 204 lb (92.5 kg)   BMI 29.27 kg/m   Wt Readings from Last 3 Encounters:  07/31/24 204 lb (92.5 kg)  06/08/24 206 lb 10.2 oz (93.7 kg)  06/07/24 206 lb 10.2 oz (93.7 kg)    BP Readings from Last 3 Encounters:  07/31/24 130/82  06/25/24 (!) 152/71  06/08/24 133/84     Physical Exam- Limited  Constitutional:  Body mass index is 29.27 kg/m. , not in  acute distress, normal state of mind Eyes:  EOMI, no exophthalmos     Results for orders placed or performed in visit on 07/16/24  BLADDER SCAN AMB NON-IMAGING   Collection Time: 07/16/24  1:44 PM  Result Value Ref Range   Scan Result 489    Complete Blood Count (Most recent): Lab Results  Component Value Date   WBC 12.3 (H) 06/08/2024   HGB 13.2 06/08/2024   HCT 42.6 06/08/2024   MCV 76.2 (L) 06/08/2024   PLT 216 06/08/2024   Chemistry (most recent): Lab Results  Component Value  Date   NA 134 (L) 06/08/2024   K 4.6 06/08/2024   CL 101 06/08/2024   CO2 23 06/08/2024   BUN 32 (H) 06/08/2024   CREATININE 1.65 (H) 06/08/2024   Lipid Panel     Component Value Date/Time   CHOL 130 11/17/2023 0820   TRIG 121 11/17/2023 0820   HDL 43 11/17/2023 0820   CHOLHDL 3.0 11/17/2023 0820   LDLCALC 65 11/17/2023 0820    Assessment & Plan:   1) Type 2 diabetes mellitus with stage 3 renal insufficiency, with long-term current use of insulin  (HCC) He comes without any logs or no meter.  He reports he is close blood glucose readings are in the 80s and highest upper 100s.  His previsit labs show A1c of 6.7% but improving.  He denies any difficult hypoglycemia.    -Patient remains at a high risk for more acute and chronic complications of diabetes which include CAD, CVA, CKD, retinopathy, and neuropathy. These are all discussed in detail with the patient.  -Recent labs reviewed showing improved renal function.  - Nutritional counseling repeated at each appointment due to patients tendency to fall back in to old habits.   - he acknowledges that there is a room for improvement in his food and drink choices. - Suggestion is made for him to avoid simple carbohydrates  from his diet including Cakes, Sweet Desserts, Ice Cream, Soda (diet and regular), Sweet Tea, Candies, Chips, Cookies, Store Bought Juices, Alcohol , Artificial Sweeteners,  Coffee Creamer, and Sugar-free Products,  Lemonade. This will help patient to have more stable blood glucose profile and potentially avoid unintended weight gain.   -He is following with Penny Crumpton, CDE for diabetes education.  - He presents with improved glycemic profile with previsit A1c of 6.7%.  He will not need prandial insulin .  He is advised to continue Toujeo  52 units nightly, associated with monitoring of blood glucose twice a day-daily before breakfast and at bedtime.    - He is advised to call the clinic if he has readings less than 70 or greater than 150 mg per DL 3 days in a week at fasting.     He worries about cost of medications, previously declined offer for Trulicity/Ozempic weekly injections.   - He is tolerating and benefiting from low-dose Jardiance .  I advised him to continue Jardiance  10 mg/day.  He has sulfa allergy, not a candidate for glipizide.  His recent CMP showed CKD.  His metformin  was lowered to 500 mg p.o. daily at breakfast.   This patient would have benefited from a CGM.  He is still researching and will let me know if you would like to go on a CGM.    - He is generally hesitant to add medications.  - Patient specific target  for A1c; LDL, HDL, Triglycerides, and  Waist Circumference were discussed in detail.  2) BP/HTN: .His blood pressure is controlled to target.  He is advised to continue Norvasc  5 mg po daily, Lasix  20 mg po daily, Lisinopril  20 mg po daily, and Metoprolol  25 mg po twice daily.  3) Lipids/HPL:  His most recent lipid panel showed continued control of his LDL at 65.  He is advised to continue pravastatin  40 mg p.o. nightly.    Side effects and precautions discussed with him.  He recently had his labs drawn at his PCP, will request copy.  4)  Weight/Diet:  His Body mass index is 29.27 kg/m.  Presents with 10 pounds of  weight loss in the recent months-he remains a candidate for some weight loss.  He is following with Penny Crumpton, CD for diabetes education.  No success  in weight loss,  exercise, and carbohydrates information provided.  5) Chronic Care/Health Maintenance: -Patient on ACEI and Statin medications and encouraged to continue to follow up with Ophthalmology, Podiatrist at least yearly or according to recommendations, and advised to stay away from smoking. I have recommended yearly flu vaccine and pneumonia vaccination at least every 5 years; moderate intensity exercise for up to 150 minutes weekly; and  sleep for at least 7 hours a day.   He is advised to maintain close follow-up with his PMD  for primary care needs.  I spent  26  minutes in the care of the patient today including review of labs from CMP, Lipids, Thyroid  Function, Hematology (current and previous including abstractions from other facilities); face-to-face time discussing  his blood glucose readings/logs, discussing hypoglycemia and hyperglycemia episodes and symptoms, medications doses, his options of short and long term treatment based on the latest standards of care / guidelines;  discussion about incorporating lifestyle medicine;  and documenting the encounter. Risk reduction counseling performed per USPSTF guidelines to reduce  obesity and cardiovascular risk factors.     Please refer to Patient Instructions for Blood Glucose Monitoring and Insulin /Medications Dosing Guide  in media tab for additional information. Please  also refer to  Patient Self Inventory in the Media  tab for reviewed elements of pertinent patient history.  Daniel Schaefer participated in the discussions, expressed understanding, and voiced agreement with the above plans.  All questions were answered to his satisfaction. he is encouraged to contact clinic should he have any questions or concerns prior to his return visit.    Follow up plan: Return in about 4 months (around 11/28/2024) for Bring Meter/CGM Device/Logs- A1c in Office.  Benton Rio, [facility] [address] [place], [place] [address] Phone: [phone] Fax: [phone]  07/31/2024, 1:54 PM

## 2024-07-31 NOTE — Patient Instructions (Signed)

## 2024-08-01 ENCOUNTER — Other Ambulatory Visit: Payer: Self-pay | Admitting: "Endocrinology

## 2024-08-01 ENCOUNTER — Telehealth: Payer: Self-pay | Admitting: Urology

## 2024-08-01 DIAGNOSIS — N138 Other obstructive and reflux uropathy: Secondary | ICD-10-CM

## 2024-08-04 ENCOUNTER — Other Ambulatory Visit: Payer: Self-pay | Admitting: Cardiology

## 2024-08-07 ENCOUNTER — Telehealth: Payer: Self-pay

## 2024-08-07 MED ORDER — ALFUZOSIN HCL ER 10 MG PO TB24: 10.0000 mg | ORAL_TABLET | Freq: Two times a day (BID) | ORAL | 3 refills | Status: AC

## 2024-08-07 NOTE — Telephone Encounter (Signed)
 Pt called stating he wishes to cut his Jardiance  dose in half. States he's experiencing side effects over the weekend such as lightheadedness, blood in his urine, raspy voice. Please advise.

## 2024-08-07 NOTE — Telephone Encounter (Addendum)
 Pt made aware and voiced understanding Uroxatal 10mg  BID

## 2024-08-08 NOTE — Telephone Encounter (Signed)
 Left a message requesting pt to return call to the office

## 2024-08-08 NOTE — Telephone Encounter (Signed)
 Pt called back and lvm for you to call him back

## 2024-08-08 NOTE — Telephone Encounter (Signed)
 Pt stated he has not experienced any hypoglycemia. States he had experienced lightheadedness, fuzzy headed, hives on his legs. States he stopped taking Jardiance  on Monday and symptoms have resolved other than blister like rash on legs. States he feels much better since discontinuing Jardiance . States the only other new medication he started several weeks ago is Silodosin  8mg  daily.

## 2024-08-22 ENCOUNTER — Ambulatory Visit: Admitting: Urology

## 2024-08-22 VITALS — BP 148/83 | HR 77

## 2024-08-22 DIAGNOSIS — R39198 Other difficulties with micturition: Secondary | ICD-10-CM

## 2024-08-22 DIAGNOSIS — N138 Other obstructive and reflux uropathy: Secondary | ICD-10-CM

## 2024-08-22 DIAGNOSIS — R338 Other retention of urine: Secondary | ICD-10-CM

## 2024-08-22 DIAGNOSIS — Z8546 Personal history of malignant neoplasm of prostate: Secondary | ICD-10-CM

## 2024-08-22 MED ORDER — CIPROFLOXACIN HCL 500 MG PO TABS
500.0000 mg | ORAL_TABLET | Freq: Once | ORAL | Status: AC
  Administered 2024-08-22: 500 mg via ORAL

## 2024-08-22 NOTE — Progress Notes (Unsigned)
° °  08/22/24  CC: No chief complaint on file.   HPI:  Blood pressure (!) 148/83, pulse 77. NED. A&Ox3.   No respiratory distress   Abd soft, NT, ND Normal phallus with bilateral descended testicles  Cystoscopy Procedure Note  Patient identification was confirmed, informed consent was obtained, and patient was prepped using Betadine  solution.  Lidocaine  jelly was administered per urethral meatus.     Pre-Procedure: - Inspection reveals a normal caliber ureteral meatus.  Procedure: The flexible cystoscope was introduced without difficulty - No urethral strictures/lesions are present. - {Blank multiple:19197::Enlarged,Surgically absent,Normal} prostate *** - {Blank multiple:19197::Normal,Elevated,Tight} bladder neck - Bilateral ureteral orifices identified - Bladder mucosa  reveals no ulcers, tumors, or lesions - No bladder stones - No trabeculation  Retroflexion shows ***   Post-Procedure: - Patient tolerated the procedure well  Assessment/ Plan: We discussed the management of his BPH including continued medical therapy, Rezum, Urolift, TURP and simple prostatectomy. After discussing the options the patient has elected to proceed with urolift. Risks/benefits/alternatives discussed.   No follow-ups on file.  Belvie Clara, MD

## 2024-08-22 NOTE — Progress Notes (Unsigned)
 Cath Change/ Replacement  Patient is present today for a catheter change due to urinary retention.  10 ml of water  was removed from the balloon, a 16 FR foley cath was removed without difficulty.  Patient was cleaned and prepped in a sterile fashion with Betadinex3.  A 16 FR foley cath was replaced into the bladder, no complications were noted. Urine return was noted 100 ml and urine was Clear yellow in color. The balloon was filled with 10ml of sterile water . A leg bag was attached for drainage.  A night bag was also given to the patient and patient was given instruction on how to change from one bag to another. Patient was given proper instruction on catheter care.    Performed by: Exie DASEN. CMA  Follow up: 4 weeks

## 2024-09-04 ENCOUNTER — Encounter: Payer: Self-pay | Admitting: Urology

## 2024-09-04 ENCOUNTER — Other Ambulatory Visit: Payer: Self-pay | Admitting: Cardiology

## 2024-09-04 NOTE — Patient Instructions (Signed)

## 2024-09-10 ENCOUNTER — Other Ambulatory Visit: Payer: Self-pay | Admitting: "Endocrinology

## 2024-09-10 DIAGNOSIS — E1159 Type 2 diabetes mellitus with other circulatory complications: Secondary | ICD-10-CM

## 2024-09-25 ENCOUNTER — Ambulatory Visit (HOSPITAL_COMMUNITY)
Admission: RE | Admit: 2024-09-25 | Discharge: 2024-09-25 | Disposition: A | Discharge status: Home / Self Care | Source: Ambulatory Visit | Attending: Urology | Admitting: Urology

## 2024-09-25 DIAGNOSIS — N200 Calculus of kidney: Secondary | ICD-10-CM | POA: Diagnosis present

## 2024-09-26 ENCOUNTER — Telehealth (HOSPITAL_BASED_OUTPATIENT_CLINIC_OR_DEPARTMENT_OTHER): Payer: Self-pay | Admitting: *Deleted

## 2024-09-26 NOTE — Telephone Encounter (Signed)
"  ° °  Pre-operative Risk Assessment    Patient Name: Daniel Schaefer  DOB: 1950/11/16 MRN: [id number]   Date of last office visit: 05/29/2024 Date of next office visit: 11/27/2024  Request for Surgical Clearance    Procedure:  Cystoscopy with urolift procedure.   Date of Surgery:  Clearance 11/08/24                                Surgeon:  Dr. Sherrilee Surgeon's Group or Practice Name:  [facility] [place] Phone number:  [phone] Fax number:  [phone]   Type of Clearance Requested:   - Medical  - Pharmacy:  Hold Warfarin (Coumadin ) 5 days prior to procedure.   Type of Anesthesia:  General    Additional requests/questions:    Signed, Edsel Grayce Sanders   09/26/2024, 4:36 PM   "

## 2024-09-27 ENCOUNTER — Telehealth: Payer: Self-pay

## 2024-09-27 NOTE — Telephone Encounter (Signed)
" ° °  Name: Daniel Schaefer  DOB: 02-07-1951  MRN: [id number]  Primary Cardiologist: Jayson Sierras, MD   Preoperative team, please contact this patient and set up a phone call appointment for further preoperative risk assessment. Please obtain consent and complete medication review. Thank you for your help.  I confirm that guidance regarding antiplatelet and oral anticoagulation therapy has been completed and, if necessary, noted below.   Per office protocol, patient can hold warfarin  for 5 days prior to procedure.   Patient will not need bridging with Lovenox (enoxaparin) around procedure.  I also confirmed the patient resides in the state of [place] . As per [facility] Medical Board telemedicine laws, the patient must reside in the state in which the provider is licensed.   Lum LITTIE Louis, NP 09/27/2024, 12:16 PM [facility]    "

## 2024-09-27 NOTE — Telephone Encounter (Signed)
 Patient has been scheduled for televisit   Procedure is scheduled for 2/02 not 2/26 and patient stated he is not taking Warfarin at the moment he was told while he is on a catheter he cannot be on it and patient stated my heart doctor knows that

## 2024-09-27 NOTE — Telephone Encounter (Signed)
 Patient with diagnosis of Afib on Warfarin  for anticoagulation.    Procedure: Cystoscopy with urolift procedure  Date of procedure: 11/08/24    CHA2DS2-VASc Score = 6   This indicates a 9.7% annual risk of stroke. The patient's score is based upon: CHF History: 1 HTN History: 1 Diabetes History: 1 Stroke History: 0 Vascular Disease History: 1 Age Score: 2 Gender Score: 0     CrCl 44 mL/min  Platelet count 216   Patient has not had an Afib/aflutter ablation in the last 3 months, DCCV within the last 4 weeks or a watchman implanted in the last 45 days   Per office protocol, patient can hold warfarin  for 5 days prior to procedure.   Patient will not need bridging with Lovenox (enoxaparin) around procedure.  **This guidance is not considered finalized until pre-operative APP has relayed final recommendations.**

## 2024-09-27 NOTE — Telephone Encounter (Signed)
 Patient has been scheduled for televisit med rec and consent done     Patient Consent for Virtual Visit         Daniel Schaefer has provided verbal consent on 09/27/2024 for a virtual visit (video or telephone).   CONSENT FOR VIRTUAL VISIT FOR:  Daniel Schaefer  By participating in this virtual visit I agree to the following:  I hereby voluntarily request, consent and authorize [facility] and its employed or contracted physicians, physician assistants, nurse practitioners or other licensed health care professionals (the Practitioner), to provide me with telemedicine health care services (the Services) as deemed necessary by the treating Practitioner. I acknowledge and consent to receive the Services by the Practitioner via telemedicine. I understand that the telemedicine visit will involve communicating with the Practitioner through live audiovisual communication technology and the disclosure of certain medical information by electronic transmission. I acknowledge that I have been given the opportunity to request an in-person assessment or other available alternative prior to the telemedicine visit and am voluntarily participating in the telemedicine visit.  I understand that I have the right to withhold or withdraw my consent to the use of telemedicine in the course of my care at any time, without affecting my right to future care or treatment, and that the Practitioner or I may terminate the telemedicine visit at any time. I understand that I have the right to inspect all information obtained and/or recorded in the course of the telemedicine visit and may receive copies of available information for a reasonable fee.  I understand that some of the potential risks of receiving the Services via telemedicine include:  Delay or interruption in medical evaluation due to technological equipment failure or disruption; Information transmitted may not be sufficient (e.g. poor resolution of  images) to allow for appropriate medical decision making by the Practitioner; and/or  In rare instances, security protocols could fail, causing a breach of personal health information.  Furthermore, I acknowledge that it is my responsibility to provide information about my medical history, conditions and care that is complete and accurate to the best of my ability. I acknowledge that Practitioner's advice, recommendations, and/or decision may be based on factors not within their control, such as incomplete or inaccurate data provided by me or distortions of diagnostic images or specimens that may result from electronic transmissions. I understand that the practice of medicine is not an exact science and that Practitioner makes no warranties or guarantees regarding treatment outcomes. I acknowledge that a copy of this consent can be made available to me via my patient portal [facility] MyChart), or I can request a printed copy by calling the office of [facility].    I understand that my insurance will be billed for this visit.   I have read or had this consent read to me. I understand the contents of this consent, which adequately explains the benefits and risks of the Services being provided via telemedicine.  I have been provided ample opportunity to ask questions regarding this consent and the Services and have had my questions answered to my satisfaction. I give my informed consent for the services to be provided through the use of telemedicine in my medical care

## 2024-09-28 ENCOUNTER — Other Ambulatory Visit: Payer: Self-pay | Admitting: Cardiology

## 2024-09-28 ENCOUNTER — Ambulatory Visit (INDEPENDENT_AMBULATORY_CARE_PROVIDER_SITE_OTHER)

## 2024-09-28 DIAGNOSIS — R339 Retention of urine, unspecified: Secondary | ICD-10-CM

## 2024-09-28 DIAGNOSIS — Z8546 Personal history of malignant neoplasm of prostate: Secondary | ICD-10-CM

## 2024-09-28 MED ORDER — CIPROFLOXACIN HCL 500 MG PO TABS
500.0000 mg | ORAL_TABLET | Freq: Once | ORAL | Status: AC
  Administered 2024-09-28: 500 mg via ORAL

## 2024-09-28 MED ORDER — CLOTRIMAZOLE 1 % EX CREA: 1.0000 | TOPICAL_CREAM | Freq: Two times a day (BID) | CUTANEOUS | 0 refills | Status: AC

## 2024-09-28 NOTE — Progress Notes (Addendum)
 Cath Change/ Replacement  Patient is present today for a catheter change due to urinary retention.  10 ml of water  was removed from the balloon, a 16 FR foley cath was removed without difficulty.  Patient was cleaned and prepped in a sterile fashion with Betadinex3.  A 16  FR foley cath was replaced into the bladder, no complications were noted. Urine return was noted 50 ml and urine was Clear yellow in color. The balloon was filled with 10ml of sterile water . A leg bag was attached for drainage.  A night bag was also given to the patient and patient was given instruction on how to change from one bag to another. Patient was given proper instruction on catheter care.    Performed by: Carlos, CMA  Follow up: Keep surgery date and f/u 2 wk VT

## 2024-10-01 NOTE — Telephone Encounter (Signed)
 In accordance with refill protocols, please review and address the following requirements before this medication refill can be authorized:  Labs   Labs needed to be done within 180 days and within a normal range

## 2024-10-03 ENCOUNTER — Ambulatory Visit: Attending: Cardiology

## 2024-10-03 DIAGNOSIS — Z01810 Encounter for preprocedural cardiovascular examination: Secondary | ICD-10-CM

## 2024-10-03 DIAGNOSIS — Z01818 Encounter for other preprocedural examination: Secondary | ICD-10-CM

## 2024-10-03 NOTE — Progress Notes (Signed)
 "   Virtual Visit via Telephone Note   Because of Daniel Schaefer co-morbid illnesses, he is at least at moderate risk for complications without adequate follow up.  This format is felt to be most appropriate for this patient at this time.  Due to technical limitations with video connection (technology), today's appointment will be conducted as an audio only telehealth visit, and Daniel Schaefer verbally agreed to proceed in this manner.   All issues noted in this document were discussed and addressed.  No physical exam could be performed with this format.  Evaluation Performed:  Preoperative cardiovascular risk assessment _____________   Date:  10/03/2024   Patient ID:  Daniel Schaefer, DOB 1951-03-28, MRN [id number] Patient Location:  Home Provider location:   Office  Primary Care Provider:  Orpha Yancey LABOR, MD Primary Cardiologist:  Jayson Sierras, MD  Chief Complaint / Patient Profile   74 y.o. y/o male with a h/o atrial fibrillation, hypertension, prostate cancer, and type 2 diabetes mellitus who is pending cystoscopy with urolift procedure and presents today for telephonic preoperative cardiovascular risk assessment.  History of Present Illness    Daniel Schaefer is a 74 y.o. male who presents via audio/video conferencing for a telehealth visit today.  Pt was last seen in cardiology clinic on 05/29/24 by Dr.McDowell.  At that time Daniel Schaefer was doing well. The patient is now pending procedure as outlined above. Since his last visit, he has been walking some at home as his catheter allows. He has no issues with this and hopes to be able to do more after the surgery.  He denies chest pain, shortness of breath, lower extremity edema, fatigue, palpitations, melena, hematuria, hemoptysis, diaphoresis, weakness, presyncope, syncope, orthopnea, and PND.  Past Medical History    Past Medical History:  Diagnosis Date   Atrial fibrillation [facility])    Reportedly diagnosed  November 2011   Chronic kidney disease    stage 2 Kidney disease   Essential hypertension    Heart failure (HCC)    History of kidney stones    Prostate cancer (HCC)    Radiation implants   Type 2 diabetes mellitus (HCC)    Past Surgical History:  Procedure Laterality Date   COLONOSCOPY WITH PROPOFOL  N/A 12/02/2023   Procedure: COLONOSCOPY WITH PROPOFOL ;  Surgeon: Cinderella Deatrice FALCON, MD;  [facility];  Service: Endoscopy;  Laterality: N/A;  7:30am;asa 3   CYSTOSCOPY/RETROGRADE/URETEROSCOPY/STONE EXTRACTION WITH BASKET Left 06/07/2024   Procedure: CYSTOSCOPY, WITH CALCULUS REMOVAL USING BASKET;  Surgeon: Sherrilee Belvie CROME, MD;  Location: [facility];  Service: Urology;  Laterality: Left;   CYSTOSCOPY/URETEROSCOPY/HOLMIUM LASER/STENT PLACEMENT Left 05/10/2024   Procedure: LEFT DIAGNOSTIC URETEROSCOPY, LEFT STENT PLACEMENT;  Surgeon: Sherrilee Belvie CROME, MD;  Location: [facility];  Service: Urology;  Laterality: Left;   CYSTOSCOPY/URETEROSCOPY/HOLMIUM LASER/STENT PLACEMENT Left 06/07/2024   Procedure: CYSTOSCOPY/URETEROSCOPY/HOLMIUM LASER/STENT EXCHANGE;  Surgeon: Sherrilee Belvie CROME, MD;  Location: [facility];  Service: Urology;  Laterality: Left;   Gold seed placement     HEMOSTASIS CLIP PLACEMENT  12/02/2023   Procedure: CONTROL OF HEMORRHAGE, GI TRACT, ENDOSCOPIC, BY CLIPPING OR OVERSEWING;  Surgeon: Cinderella Deatrice FALCON, MD;  [facility];  Service: Endoscopy;;   HOLMIUM LASER APPLICATION Left 06/07/2024   Procedure: HOLMIUM LASER APPLICATION;  Surgeon: Sherrilee Belvie CROME, MD;  Location: [facility];  Service: Urology;  Laterality: Left;   INGUINAL HERNIA REPAIR Left 07/02/2020   Procedure: LEFT INGUINAL HERNIORRHAPY WITH MESH;  Surgeon: Mavis Anes,  MD;  Location: [facility];  Service: General;  Laterality: Left;   LITHOTRIPSY     POLYPECTOMY  12/02/2023   Procedure: POLYPECTOMY;  Surgeon: Cinderella Deatrice FALCON, MD;  [facility];  Service: Endoscopy;;   PROSTATE BIOPSY      TRIGGER FINGER RELEASE Left    ring finger   URETEROSCOPY WITH HOLMIUM LASER LITHOTRIPSY      Allergies  Allergies[1]  Home Medications    Prior to Admission medications  Medication Sig Start Date End Date Taking? Authorizing Provider  ACCU-CHEK GUIDE TEST test strip USE 1 STRIP TO CHECK GLUCOSE TWICE DAILY 09/10/24   [name], Gebreselassie W, MD  alfuzosin  (UROXATRAL ) 10 MG 24 hr tablet Take 1 tablet (10 mg total) by mouth 2 (two) times daily. 08/07/24   McKenzie, Belvie CROME, MD  amLODipine  (NORVASC ) 5 MG tablet Take 1 tablet by mouth once daily 08/07/24   Debera Jayson MATSU, MD  Blood Glucose Monitoring Suppl (ACCU-CHEK GUIDE ME) w/Device KIT 1 Piece by Does not apply route as directed. 10/05/21   [name], Gebreselassie W, MD  Calcium Carbonate (CALCIUM 600 PO) Take 300 mg by mouth daily.    [provider]  Cholecalciferol (VITAMIN D3) 125 MCG (5000 UT) TABS Take 5,000 Units by mouth daily.     [provider]  clotrimazole  (LOTRIMIN ) 1 % cream Apply 1 Application topically 2 (two) times daily. 09/28/24   McKenzie, Belvie CROME, MD  empagliflozin  (JARDIANCE ) 10 MG TABS tablet Take 1 tablet (10 mg total) by mouth daily with breakfast. Patient not taking: Reported on 08/22/2024 07/31/24   [name], Gebreselassie W, MD  Flaxseed, Linseed, (FLAXSEED OIL) 1200 MG CAPS Take 1,200 mg by mouth in the morning.    [provider]  furosemide  (LASIX ) 20 MG tablet TAKE 1 TABLET BY MOUTH ONCE DAILY. PLEASE CALL [phone] TO SCHEDULE AN APPOINTMENT FOR FUTURE REFILLS 10/02/24   Debera Jayson MATSU, MD  GLOBAL EASE INJECT PEN NEEDLES 31G X 8 MM MISC USE 1 DAILY AS DIRECTED - RUN ON CASH $15 - PER PC. 12/21/17   [name], Gebreselassie W, MD  Landy Tea, Camellia sinensis, (GREEN TEA EXTRACT PO) Take 500 mg by mouth in the morning.    [provider]  insulin  glargine, 2 Unit Dial, (TOUJEO  MAX SOLOSTAR) 300 UNIT/ML Solostar Pen Inject 52 Units into the skin at bedtime. 07/31/24   [name],  Gebreselassie W, MD  Insulin  Pen Needle (B-D ULTRAFINE III SHORT PEN) 31G X 8 MM MISC 1 each by Does not apply route as directed. 06/01/18   [name], Gebreselassie W, MD  Lancets MISC 1 each by Does not apply route as directed. 10/05/16   [name], Gebreselassie W, MD  loratadine (CLARITIN) 10 MG tablet Take 10 mg by mouth in the morning.    [provider]  Magnesium 250 MG TABS Take 125 mg by mouth in the morning.    [provider]  Melatonin 10 MG TABS Take 10 mg by mouth at bedtime as needed (sleep).    [provider]  metFORMIN  (GLUCOPHAGE ) 500 MG tablet Take 1 tablet by mouth once daily with breakfast 08/02/24   [name], Gebreselassie W, MD  olmesartan  (BENICAR ) 40 MG tablet Take 1 tablet (40 mg total) by mouth daily. REFILLS TO PCP 09/29/23   Darlean Ozell NOVAK, MD  omeprazole (PRILOSEC) 20 MG capsule Take 20 mg by mouth in the morning. 12/24/19   [provider]  ondansetron  (ZOFRAN ) 4 MG tablet Take 1 tablet (4 mg  total) by mouth daily as needed for nausea or vomiting. 06/07/24 06/07/25  McKenzie, Belvie CROME, MD  oxyCODONE -acetaminophen  (PERCOCET) 5-325 MG tablet Take 1 tablet by mouth every 4 (four) hours as needed for severe pain (pain score 7-10). 06/07/24 06/07/25  Sherrilee Belvie CROME, MD  potassium chloride  (KLOR-CON ) 10 MEQ tablet TAKE 1 TABLET BY MOUTH DAILY 06/28/24   Debera Jayson MATSU, MD  pravastatin  (PRAVACHOL ) 40 MG tablet TAKE 1 TABLET BY MOUTH AT BEDTIME 09/04/24   Debera Jayson MATSU, MD  Selenium 200 MCG CAPS Take 200 mcg by mouth daily.    [provider]  warfarin (COUMADIN ) 5 MG tablet TAKE ONE TABLET BY MOUTH ONCE DAILY EXCEPT TAKE ONE-HALF TABLET ON WEDNESDAY OR AS DIRECTED Patient taking differently: Take 5 mg by mouth every evening. OR AS DIRECTED 05/15/24   Debera Jayson MATSU, MD  Zinc 50 MG TABS Take 50 mg by mouth in the morning.    [provider]    Physical Exam    Vital Signs:  Daniel Schaefer does not have vital signs  available for review today.  Given telephonic nature of communication, physical exam is limited. AAOx3. NAD. Normal affect.  Speech and respirations are unlabored.  Accessory Clinical Findings    None  Assessment & Plan    1.  Preoperative Cardiovascular Risk Assessment:  According to the Revised Cardiac Risk Index (RCRI), his Perioperative Risk of Major Cardiac Event is (%): 0.9 His Functional Capacity in METs is: 4.3 according to the [employer] Activity Status Index (DASI). Per AHA/ACC guidelines, he is deemed acceptable risk for the planned procedure without additional cardiovascular testing. Will route to surgical team so they are aware.   The patient was advised that if he develops new symptoms prior to surgery to contact our office to arrange for a follow-up visit, and he verbalized understanding.  Per office protocol, patient can hold warfarin  for 5 days prior to procedure.   Patient will not need bridging with Lovenox (enoxaparin) around procedure.  A copy of this note will be routed to requesting surgeon.  Time:   Today, I have spent 10 minutes with the patient with telehealth technology discussing medical history, symptoms, and management plan.     Mardy KATHEE Pizza, FNP  10/03/2024, 10:24 AM     [1]  Allergies Allergen Reactions   Bactrim [Sulfamethoxazole-Trimethoprim] Other (See Comments)    Thin his blood really bad   Other     Cannot mix warfarin and bactrim    Tresiba  Flextouch [Insulin  Degludec] Rash   "

## 2024-10-12 ENCOUNTER — Encounter (HOSPITAL_COMMUNITY)
Admission: RE | Admit: 2024-10-12 | Discharge: 2024-10-12 | Disposition: A | Source: Ambulatory Visit | Attending: Urology

## 2024-10-12 ENCOUNTER — Other Ambulatory Visit: Payer: Self-pay

## 2024-10-12 ENCOUNTER — Encounter (HOSPITAL_COMMUNITY): Payer: Self-pay

## 2024-10-12 VITALS — BP 162/85 | HR 69 | Resp 18 | Ht 70.0 in | Wt 203.9 lb

## 2024-10-12 DIAGNOSIS — Z01818 Encounter for other preprocedural examination: Secondary | ICD-10-CM | POA: Diagnosis present

## 2024-10-12 DIAGNOSIS — E1151 Type 2 diabetes mellitus with diabetic peripheral angiopathy without gangrene: Secondary | ICD-10-CM | POA: Insufficient documentation

## 2024-10-12 HISTORY — DX: Heart failure, unspecified: I50.9

## 2024-10-12 HISTORY — DX: Presence of other specified devices: Z97.8

## 2024-10-12 LAB — BASIC METABOLIC PANEL WITH GFR
Anion gap: 14 (ref 5–15)
BUN: 25 mg/dL — ABNORMAL HIGH (ref 8–23)
CO2: 21 mmol/L — ABNORMAL LOW (ref 22–32)
Calcium: 8.6 mg/dL — ABNORMAL LOW (ref 8.9–10.3)
Chloride: 102 mmol/L (ref 98–111)
Creatinine, Ser: 1.09 mg/dL (ref 0.61–1.24)
GFR, Estimated: >60 mL/min
Glucose, Bld: 249 mg/dL — ABNORMAL HIGH (ref 70–99)
Potassium: 4.4 mmol/L (ref 3.5–5.1)
Sodium: 137 mmol/L (ref 135–145)

## 2024-10-12 LAB — HEMOGLOBIN A1C
Hgb A1c MFr Bld: 8.5 % — ABNORMAL HIGH (ref 4.8–5.6)
Mean Plasma Glucose: 197.25 mg/dL

## 2024-10-12 NOTE — Patient Instructions (Signed)
 "        Daniel Schaefer  10/12/2024     @[email or web address] @   Your procedure is scheduled on 10/15/2024.   Report to [facility] at  0600 A.M.   Call this number if you have problems the morning of surgery:  [phone]  If you experience any cold or flu symptoms such as cough, fever, chills, shortness of breath, etc. between now and your scheduled surgery, please notify us  at the above number.   Remember:        Your last dose of coumadin  should have been on 10/09/2024.        Take 1/2 of your usual night time insulin  the night before your procedure.   Do not eat or drink after midnight.      Take these medicines the morning of surgery with A SIP OF WATER                          alfuzosin , amlodipine , omeprazole.    Do not wear jewelry, make-up or nail polish, including gel polish,  artificial nails, or any other type of covering on natural nails (fingers and  toes).  Do not wear lotions, powders, or perfumes, or deodorant.  Do not shave 48 hours prior to surgery.  Men may shave face and neck.  Do not bring valuables to the hospital.  [facility] is not responsible for any belongings or valuables.  Contacts, dentures or bridgework may not be worn into surgery.  Leave your suitcase in the car.  After surgery it may be brought to your room.  For patients admitted to the hospital, discharge time will be determined by your treatment team.  Patients discharged the day of surgery will not be allowed to drive home and must have someone with them for 24 hours.    Special instructions:   DO NOT smoke tobacco or vape for 24 hours before your procedure.  Please read over the following fact sheets that you were given. Coughing and Deep Breathing, Anesthesia Post-op Instructions, and Care and Recovery After Surgery      Placing an Implant to Help Urine Flow by Lifting the Prostate (Prostatic Urethral Lift): What to Know After After a prostatic urethral lift, you may have  some problems for a short time. These include: Soreness in your penis. Burning when peeing. An increased urge to pee. Peeing more often. Pee that has some blood. Follow these instructions at home: Medicines Take your medicines only as told. You may need to take steps to help treat or prevent trouble pooping (constipation), such as: Taking medicines to help you poop. Eating foods high in fiber, like beans, whole grains, and fresh fruits and vegetables. Drinking more fluids as told. Ask your health care provider if it's safe to drive or use machines while taking your medicine. Activity  If you were given a sedative, do not drive or use machines until you're told it's safe. A sedative can make you sleepy. Rest as told. Get up to take short walks many times during the day. This helps you breathe better and keeps your blood flowing. Ask for help if you feel weak or unsteady. You may have to avoid lifting. Ask your provider how much you can safely lift. Avoid intense physical activity for as long as told by your provider. Return to your normal activities as told by your provider. Ask what activities are safe for you. Ask when you can have sex again.  General instructions Do not smoke, vape, or use nicotine or tobacco. Doing this can slow down healing. Contact a health care provider if: You have chills or fever. You have pain when peeing. You have bright red blood or blood clots in your pee. You leak pee. You have leg pain or swelling. You have trouble peeing or you can't pee. Get help right away if: You have chest pain. You have trouble breathing. These symptoms may be an emergency. Call 911 right away. Do not wait to see if the symptoms will go away. Do not drive yourself to the hospital. This information is not intended to replace advice given to you by your health care provider. Make sure you discuss any questions you have with your health care provider. Document Revised:  04/10/2024 Document Reviewed: 04/10/2024 Elsevier Patient Education  2025 [employer]Medicines to Make You Sleep for Surgery (General Anesthesia) in Adults: What to Know After The following information offers guidance on how to care for yourself after your procedure. Your health care provider may also give you more specific instructions. If you have problems or questions, contact your health care provider. What can I expect after the procedure? After the procedure, it is common for people to: Have pain or discomfort at the IV site. Have nausea or vomiting. Have a sore throat or hoarseness. Have trouble concentrating. Feel cold or chills. Feel weak, sleepy, or tired (fatigue). Have soreness and body aches. These can affect parts of the body that were not involved in surgery. Follow these instructions at home: For the time period you were told by your health care provider:  Rest. Do not participate in activities where you could fall or become injured. Do not drive or use machinery. Do not drink alcohol. Do not take sleeping pills or medicines that cause drowsiness. Do not make important decisions or sign legal documents. Do not take care of children on your own. General instructions Drink enough fluid to keep your urine pale yellow. If you have sleep apnea, surgery and certain medicines can increase your risk for breathing problems. Follow instructions from your health care provider about wearing your sleep device: Anytime you are sleeping, including during daytime naps. While taking prescription pain medicines, sleeping medicines, or medicines that make you drowsy. Return to your normal activities as told by your health care provider. Ask your health care provider what activities are safe for you. Take over-the-counter and prescription medicines only as told by your health care provider. Do not use any products that contain nicotine or tobacco. These products include cigarettes,  chewing tobacco, and vaping devices, such as e-cigarettes. These can delay incision healing after surgery. If you need help quitting, ask your health care provider. Contact a health care provider if: You have nausea or vomiting that does not get better with medicine. You vomit every time you eat or drink. You have pain that does not get better with medicine. You cannot urinate or have bloody urine. You develop a skin rash. You have a fever. Get help right away if: You have trouble breathing. You have chest pain. You vomit blood. These symptoms may be an emergency. Get help right away. Call 911. Do not wait to see if the symptoms will go away. Do not drive yourself to the hospital. Summary After the procedure, it is common to have a sore throat, hoarseness, nausea, vomiting, or to feel weak, sleepy, or fatigue. For the time period you were told by your health care provider, do not drive  or use machinery. Get help right away if you have difficulty breathing, have chest pain, or vomit blood. These symptoms may be an emergency. This information is not intended to replace advice given to you by your health care provider. Make sure you discuss any questions you have with your health care provider. Document Revised: 07/09/2024 Document Reviewed: 11/27/2021 Elsevier Patient Education  2025 [employer]How to Use Chlorhexidine  at Home in the Shower Chlorhexidine  gluconate (CHG) is a germ-killing (antiseptic) wash that's used to clean the skin. It can get rid of the germs that normally live on the skin and can keep them away for about 24 hours. If you're having surgery, you may be told to shower with CHG at home the night before surgery. This can help lower your risk for infection. To use CHG wash in the shower, follow the steps below. Supplies needed: CHG body wash. Clean washcloth. Clean towel. How to use CHG in the shower Follow these steps unless you're told to use CHG in a different  way: Start the shower. Use your normal soap and shampoo to wash your face and hair. Turn off the shower or move out of the shower stream. Pour CHG onto a clean washcloth. Do not use any type of brush or rough sponge. Start at your neck, washing your body down to your toes. Make sure you: Wash the part of your body where the surgery will be done for at least 1 minute. Do not scrub. Do not use CHG on your head or face unless your health care provider tells you to. If it gets into your ears or eyes, rinse them well with water . Do not wash your genitals with CHG. Wash your back and under your arms. Make sure to wash skin folds. Let the CHG sit on your skin for 1-2 minutes or as long as told. Rinse your entire body in the shower, including all body creases and folds. Turn off the shower. Dry off with a clean towel. Do not put anything on your skin afterward, such as powder, lotion, or perfume. Put on clean clothes or pajamas. If it's the night before surgery, sleep in clean sheets. General tips Use CHG only as told, and follow the instructions on the label. Use the full amount of CHG as told. This is often one bottle. Do not smoke and stay away from flames after using CHG. Your skin may feel sticky after using CHG. This is normal. The sticky feeling will go away as the CHG dries. Do not use CHG: If you have a chlorhexidine  allergy or have reacted to chlorhexidine  in the past. On open wounds or areas of skin that have broken skin, cuts, or scrapes. On babies younger than 88 months of age. Contact a health care provider if: You have questions about using CHG. Your skin gets irritated or itchy. You have a rash after using CHG. You swallow any CHG. Call your local poison control center [phone] in the [place] Your eyes itch badly, or they become very red or swollen. Your hearing changes. You have trouble seeing. If you can't reach your provider, go to an urgent care or emergency room. Do  not drive yourself. Get help right away if: You have swelling or tingling in your mouth or throat. You make high-pitched whistling sounds when you breathe, most often when you breathe out (wheeze). You have trouble breathing. These symptoms may be an emergency. Call 911 right away. Do not wait to see if the symptoms will go  away. Do not drive yourself to the hospital. This information is not intended to replace advice given to you by your health care provider. Make sure you discuss any questions you have with your health care provider. Document Revised: 03/15/2023 Document Reviewed: 03/11/2022 Elsevier Patient Education  2024 [employer]. "

## 2024-10-15 DIAGNOSIS — E1151 Type 2 diabetes mellitus with diabetic peripheral angiopathy without gangrene: Secondary | ICD-10-CM

## 2024-10-17 ENCOUNTER — Ambulatory Visit: Admitting: Urology

## 2024-10-24 ENCOUNTER — Ambulatory Visit: Admitting: Urology

## 2024-10-24 ENCOUNTER — Other Ambulatory Visit (HOSPITAL_COMMUNITY)

## 2024-10-29 ENCOUNTER — Encounter (HOSPITAL_COMMUNITY): Admission: RE | Payer: Self-pay | Source: Home / Self Care

## 2024-10-29 ENCOUNTER — Ambulatory Visit (HOSPITAL_COMMUNITY): Admission: RE | Admit: 2024-10-29 | Payer: Self-pay | Source: Home / Self Care | Admitting: Urology

## 2024-10-29 DIAGNOSIS — E1151 Type 2 diabetes mellitus with diabetic peripheral angiopathy without gangrene: Secondary | ICD-10-CM

## 2024-10-31 ENCOUNTER — Ambulatory Visit: Admitting: Urology

## 2024-11-27 ENCOUNTER — Ambulatory Visit: Admitting: Cardiology

## 2024-11-29 ENCOUNTER — Ambulatory Visit: Admitting: "Endocrinology
